# Patient Record
Sex: Male | Born: 1935
Health system: Southern US, Community
[De-identification: ages and names within clinical notes are randomized; demographics above are authoritative.]

## PROBLEM LIST (undated history)

## (undated) DIAGNOSIS — K449 Diaphragmatic hernia without obstruction or gangrene: Secondary | ICD-10-CM

## (undated) DIAGNOSIS — K469 Unspecified abdominal hernia without obstruction or gangrene: Secondary | ICD-10-CM

## (undated) DIAGNOSIS — K5790 Diverticulosis of intestine, part unspecified, without perforation or abscess without bleeding: Secondary | ICD-10-CM

## (undated) DIAGNOSIS — F419 Anxiety disorder, unspecified: Secondary | ICD-10-CM

## (undated) DIAGNOSIS — E785 Hyperlipidemia, unspecified: Secondary | ICD-10-CM

## (undated) DIAGNOSIS — H269 Unspecified cataract: Secondary | ICD-10-CM

## (undated) DIAGNOSIS — T7840XA Allergy, unspecified, initial encounter: Secondary | ICD-10-CM

## (undated) DIAGNOSIS — J329 Chronic sinusitis, unspecified: Secondary | ICD-10-CM

## (undated) HISTORY — DX: Chronic sinusitis, unspecified: J32.9

## (undated) HISTORY — DX: Unspecified abdominal hernia without obstruction or gangrene: K46.9

## (undated) HISTORY — DX: Unspecified cataract: H26.9

## (undated) HISTORY — DX: Hyperlipidemia, unspecified: E78.5

## (undated) HISTORY — PX: TONSILLECTOMY: SUR1361

## (undated) HISTORY — DX: Anxiety disorder, unspecified: F41.9

## (undated) HISTORY — DX: Allergy, unspecified, initial encounter: T78.40XA

---

## 1971-03-14 HISTORY — PX: THYROID CYST EXCISION: SHX2511

## 2002-10-29 ENCOUNTER — Emergency Department (HOSPITAL_COMMUNITY): Admission: EM | Admit: 2002-10-29 | Discharge: 2002-10-29 | Payer: Self-pay | Admitting: Emergency Medicine

## 2002-10-29 ENCOUNTER — Encounter: Payer: Self-pay | Admitting: Emergency Medicine

## 2002-11-03 ENCOUNTER — Emergency Department (HOSPITAL_COMMUNITY): Admission: EM | Admit: 2002-11-03 | Discharge: 2002-11-04 | Payer: Self-pay | Admitting: Emergency Medicine

## 2006-04-19 ENCOUNTER — Ambulatory Visit: Payer: Self-pay | Admitting: Cardiology

## 2006-04-24 ENCOUNTER — Ambulatory Visit: Payer: Self-pay

## 2006-05-29 ENCOUNTER — Ambulatory Visit: Payer: Self-pay | Admitting: Cardiology

## 2011-08-28 ENCOUNTER — Ambulatory Visit: Payer: Self-pay | Admitting: Physical Therapy

## 2012-07-26 ENCOUNTER — Other Ambulatory Visit (INDEPENDENT_AMBULATORY_CARE_PROVIDER_SITE_OTHER): Payer: Medicare Other

## 2012-07-26 DIAGNOSIS — E785 Hyperlipidemia, unspecified: Secondary | ICD-10-CM

## 2012-07-26 DIAGNOSIS — R5383 Other fatigue: Secondary | ICD-10-CM

## 2012-07-26 DIAGNOSIS — E559 Vitamin D deficiency, unspecified: Secondary | ICD-10-CM

## 2012-07-26 DIAGNOSIS — I10 Essential (primary) hypertension: Secondary | ICD-10-CM

## 2012-07-26 LAB — POCT CBC
Lymph, poc: 2.2 (ref 0.6–3.4)
MCH, POC: 32 pg — AB (ref 27–31.2)
MCV: 96.4 fL (ref 80–97)
MPV: 7.4 fL (ref 0–99.8)
POC LYMPH PERCENT: 32.9 %L (ref 10–50)
RBC: 5 M/uL (ref 4.69–6.13)
WBC: 6.6 10*3/uL (ref 4.6–10.2)

## 2012-07-26 LAB — BASIC METABOLIC PANEL WITH GFR
CO2: 30 mEq/L (ref 19–32)
Calcium: 9.8 mg/dL (ref 8.4–10.5)
GFR, Est Non African American: 86 mL/min
Glucose, Bld: 93 mg/dL (ref 70–99)
Potassium: 5.6 mEq/L — ABNORMAL HIGH (ref 3.5–5.3)

## 2012-07-26 LAB — HEPATIC FUNCTION PANEL
ALT: 16 U/L (ref 0–53)
Albumin: 4.3 g/dL (ref 3.5–5.2)
Bilirubin, Direct: 0.1 mg/dL (ref 0.0–0.3)
Indirect Bilirubin: 0.5 mg/dL (ref 0.0–0.9)
Total Protein: 7 g/dL (ref 6.0–8.3)

## 2012-07-26 NOTE — Progress Notes (Unsigned)
Patient came in for labs only, Patient seen Helene Kelp and now has appointment with Dr.Moore

## 2012-07-31 ENCOUNTER — Telehealth: Payer: Self-pay | Admitting: Family Medicine

## 2012-08-01 ENCOUNTER — Other Ambulatory Visit (INDEPENDENT_AMBULATORY_CARE_PROVIDER_SITE_OTHER): Payer: Medicare Other

## 2012-08-01 DIAGNOSIS — R7989 Other specified abnormal findings of blood chemistry: Secondary | ICD-10-CM

## 2012-08-01 LAB — NMR LIPOPROFILE WITH LIPIDS
Cholesterol, Total: 251 mg/dL — ABNORMAL HIGH (ref <200)
HDL Size: 8.6 nm — ABNORMAL LOW (ref >=9.2)
HDL-C: 48 mg/dL (ref >=40)
LDL (calc): 173 mg/dL — ABNORMAL HIGH (ref <100)
LDL Particle Number: 2692 nmol/L — ABNORMAL HIGH (ref <1000)
LDL Size: 21 nm (ref >20.5)
LP-IR Score: 61 — ABNORMAL HIGH (ref <=45)
Large VLDL-P: 1.1 nmol/L (ref <=2.7)
VLDL Size: 46.9 nm — ABNORMAL HIGH (ref <=46.6)

## 2012-08-01 NOTE — Telephone Encounter (Signed)
I see the results of the CBC from 07/26/12 but none of the other results are back.  Can you check on this for me?

## 2012-08-01 NOTE — Telephone Encounter (Signed)
I checked with the lab and the NMR is holding it up. It should be done today or by the latest tomorrow.

## 2012-08-02 LAB — BASIC METABOLIC PANEL WITH GFR
CO2: 28 mEq/L (ref 19–32)
Calcium: 9 mg/dL (ref 8.4–10.5)
Potassium: 3.8 mEq/L (ref 3.5–5.3)
Sodium: 139 mEq/L (ref 135–145)

## 2012-08-02 NOTE — Telephone Encounter (Signed)
All lab results are back.  Left message on patient's home voicemail to return call about results.

## 2012-08-12 ENCOUNTER — Ambulatory Visit (INDEPENDENT_AMBULATORY_CARE_PROVIDER_SITE_OTHER): Payer: Medicare Other | Admitting: Physician Assistant

## 2012-08-12 ENCOUNTER — Encounter: Payer: Self-pay | Admitting: Physician Assistant

## 2012-08-12 VITALS — BP 129/69 | HR 78 | Temp 98.0°F | Ht 65.0 in | Wt 143.0 lb

## 2012-08-12 DIAGNOSIS — J309 Allergic rhinitis, unspecified: Secondary | ICD-10-CM

## 2012-08-12 DIAGNOSIS — F411 Generalized anxiety disorder: Secondary | ICD-10-CM | POA: Insufficient documentation

## 2012-08-12 DIAGNOSIS — J329 Chronic sinusitis, unspecified: Secondary | ICD-10-CM

## 2012-08-12 MED ORDER — CITALOPRAM HYDROBROMIDE 20 MG PO TABS: 20.0000 mg | ORAL_TABLET | Freq: Every day | ORAL | Status: DC

## 2012-08-12 MED ORDER — AMOXICILLIN-POT CLAVULANATE 875-125 MG PO TABS: 1.0000 | ORAL_TABLET | Freq: Two times a day (BID) | ORAL | Status: DC

## 2012-08-12 NOTE — Progress Notes (Signed)
Subjective:     Patient ID: Craig Baird, male   DOB: November 14, 1933, 79 y.o.   MRN: [id number]  Sinusitis This is a recurrent problem. The current episode started in the past 7 days. The problem has been gradually worsening since onset. There has been no fever. His pain is at a severity of 4/10. The pain is mild. Associated symptoms include congestion, headaches, a hoarse voice, sinus pressure and sneezing. Past treatments include acetaminophen. The treatment provided no relief.     Review of Systems  HENT: Positive for congestion, hoarse voice, sneezing and sinus pressure.   Neurological: Positive for headaches.  All other systems reviewed and are negative.       Objective:   Physical Exam  Nursing note and vitals reviewed. Constitutional: He appears well-developed and well-nourished.  HENT:  Right Ear: External ear normal.  Left Ear: External ear normal.  Nose: Nose normal.  Mouth/Throat: Oropharynx is clear and moist.  Thick PND + sinus TTP  Neck: Neck supple. No JVD present.  Cardiovascular: Normal rate, regular rhythm and normal heart sounds.   Pulmonary/Chest: Effort normal and breath sounds normal. He has no wheezes.  Lymphadenopathy:    He has no cervical adenopathy.       Assessment:     1. Allergic rhinitis   2. Anxiety state, unspecified        Plan:     Nasonex sample given OTC antihistamines Fluids Rest Augmentin rx Rf of Celexa done today

## 2012-08-12 NOTE — Patient Instructions (Signed)
Sinusitis Sinusitis is redness, soreness, and swelling (inflammation) of the paranasal sinuses. Paranasal sinuses are air pockets within the bones of your face (beneath the eyes, the middle of the forehead, or above the eyes). In healthy paranasal sinuses, mucus is able to drain out, and air is able to circulate through them by way of your nose. However, when your paranasal sinuses are inflamed, mucus and air can become trapped. This can allow bacteria and other germs to grow and cause infection. Sinusitis can develop quickly and last only a short time (acute) or continue over a long period (chronic). Sinusitis that lasts for more than 12 weeks is considered chronic.  CAUSES  Causes of sinusitis include:  Allergies.  Structural abnormalities, such as displacement of the cartilage that separates your nostrils (deviated septum), which can decrease the air flow through your nose and sinuses and affect sinus drainage.  Functional abnormalities, such as when the small hairs (cilia) that line your sinuses and help remove mucus do not work properly or are not present. SYMPTOMS  Symptoms of acute and chronic sinusitis are the same. The primary symptoms are pain and pressure around the affected sinuses. Other symptoms include:  Upper toothache.  Earache.  Headache.  Bad breath.  Decreased sense of smell and taste.  A cough, which worsens when you are lying flat.  Fatigue.  Fever.  Thick drainage from your nose, which often is green and may contain pus (purulent).  Swelling and warmth over the affected sinuses. DIAGNOSIS  Your caregiver will perform a physical exam. During the exam, your caregiver may:  Look in your nose for signs of abnormal growths in your nostrils (nasal polyps).  Tap over the affected sinus to check for signs of infection.  View the inside of your sinuses (endoscopy) with a special imaging device with a light attached (endoscope), which is inserted into your  sinuses. If your caregiver suspects that you have chronic sinusitis, one or more of the following tests may be recommended:  Allergy tests.  Nasal culture A sample of mucus is taken from your nose and sent to a lab and screened for bacteria.  Nasal cytology A sample of mucus is taken from your nose and examined by your caregiver to determine if your sinusitis is related to an allergy. TREATMENT  Most cases of acute sinusitis are related to a viral infection and will resolve on their own within 10 days. Sometimes medicines are prescribed to help relieve symptoms (pain medicine, decongestants, nasal steroid sprays, or saline sprays).  However, for sinusitis related to a bacterial infection, your caregiver will prescribe antibiotic medicines. These are medicines that will help kill the bacteria causing the infection.  Rarely, sinusitis is caused by a fungal infection. In theses cases, your caregiver will prescribe antifungal medicine. For some cases of chronic sinusitis, surgery is needed. Generally, these are cases in which sinusitis recurs more than 3 times per year, despite other treatments. HOME CARE INSTRUCTIONS   Drink plenty of water. Water helps thin the mucus so your sinuses can drain more easily.  Use a humidifier.  Inhale steam 3 to 4 times a day (for example, sit in the bathroom with the shower running).  Apply a warm, moist washcloth to your face 3 to 4 times a day, or as directed by your caregiver.  Use saline nasal sprays to help moisten and clean your sinuses.  Take over-the-counter or prescription medicines for pain, discomfort, or fever only as directed by your caregiver. SEEK IMMEDIATE MEDICAL   CARE IF:  You have increasing pain or severe headaches.  You have nausea, vomiting, or drowsiness.  You have swelling around your face.  You have vision problems.  You have a stiff neck.  You have difficulty breathing. MAKE SURE YOU:   Understand these  instructions.  Will watch your condition.  Will get help right away if you are not doing well or get worse. Document Released: 02/27/2005 Document Revised: 05/22/2011 Document Reviewed: 03/14/2011 [facility] Patient Information 2014 [place], [place]. Allergic Rhinitis Allergic rhinitis is when the mucous membranes in the nose respond to allergens. Allergens are particles in the air that cause your body to have an allergic reaction. This causes you to release allergic antibodies. Through a chain of events, these eventually cause you to release histamine into the blood stream (hence the use of antihistamines). Although meant to be protective to the body, it is this release that causes your discomfort, such as frequent sneezing, congestion and an itchy runny nose.  CAUSES  The pollen allergens may come from grasses, trees, and weeds. This is seasonal allergic rhinitis, or "hay fever." Other allergens cause year-round allergic rhinitis (perennial allergic rhinitis) such as house dust mite allergen, pet dander and mold spores.  SYMPTOMS   Nasal stuffiness (congestion).  Runny, itchy nose with sneezing and tearing of the eyes.  There is often an itching of the mouth, eyes and ears. It cannot be cured, but it can be controlled with medications. DIAGNOSIS  If you are unable to determine the offending allergen, skin or blood testing may find it. TREATMENT   Avoid the allergen.  Medications and allergy shots (immunotherapy) can help.  Hay fever may often be treated with antihistamines in pill or nasal spray forms. Antihistamines block the effects of histamine. There are over-the-counter medicines that may help with nasal congestion and swelling around the eyes. Check with your caregiver before taking or giving this medicine. If the treatment above does not work, there are many new medications your caregiver can prescribe. Stronger medications may be used if initial measures are ineffective.  Desensitizing injections can be used if medications and avoidance fails. Desensitization is when a patient is given ongoing shots until the body becomes less sensitive to the allergen. Make sure you follow up with your caregiver if problems continue. SEEK MEDICAL CARE IF:   You develop fever (more than 100.5 F (38.1 C).  You develop a cough that does not stop easily (persistent).  You have shortness of breath.  You start wheezing.  Symptoms interfere with normal daily activities. Document Released: 11/22/2000 Document Revised: 05/22/2011 Document Reviewed: 06/03/2008 [facility] Patient Information 2014 [place], [place].

## 2012-08-16 ENCOUNTER — Ambulatory Visit: Payer: Self-pay | Admitting: Family Medicine

## 2012-09-30 ENCOUNTER — Ambulatory Visit (INDEPENDENT_AMBULATORY_CARE_PROVIDER_SITE_OTHER): Payer: Medicare Other | Admitting: Family Medicine

## 2012-09-30 ENCOUNTER — Encounter: Payer: Self-pay | Admitting: Family Medicine

## 2012-09-30 VITALS — BP 132/67 | HR 55 | Temp 97.8°F | Ht 65.0 in | Wt 142.8 lb

## 2012-09-30 DIAGNOSIS — F329 Major depressive disorder, single episode, unspecified: Secondary | ICD-10-CM

## 2012-09-30 DIAGNOSIS — J329 Chronic sinusitis, unspecified: Secondary | ICD-10-CM

## 2012-09-30 NOTE — Patient Instructions (Signed)

## 2012-09-30 NOTE — Progress Notes (Signed)
  Subjective:    Patient ID: Craig Baird, male    DOB: 06/25/1933, 79 y.o.   MRN: [id number]  HPI  This 80 y.o. male presents for evaluation of sinus congestion and uri sx's.  Review of Systems    No chest pain, SOB, HA, dizziness, vision change, N/V, diarrhea, constipation, dysuria, urinary urgency or frequency, myalgias, arthralgias or rash.  Objective:   Physical Exam Vital signs noted  Well developed well nourished male.  HEENT - Head atraumatic Normocephalic                Eyes - PERRLA, Conjuctiva - clear Sclera- Clear EOMI                Ears - EAC's Wnl TM's Wnl Gross Hearing WNL                Nose - Nares patent                 Throat - oropharanx wnl Respiratory - Lungs CTA bilateral Cardiac - RRR S1 and S2 without murmur GI - Abdomen soft Nontender and bowel sounds active x 4 Extremities - No edema. Neuro - Grossly intact.       Assessment & Plan:  Depression Citalopram 20mg  one po qd #30w/11rf  Sinusitis - Augmentin 875mg  one po bid x 10 days #20, push po fluids, rest, and take tylenol and motrin otc prn.

## 2012-10-02 ENCOUNTER — Ambulatory Visit: Payer: Medicare Other | Admitting: Family Medicine

## 2013-01-31 ENCOUNTER — Ambulatory Visit (INDEPENDENT_AMBULATORY_CARE_PROVIDER_SITE_OTHER): Payer: Medicare Other | Admitting: Family Medicine

## 2013-01-31 ENCOUNTER — Ambulatory Visit (INDEPENDENT_AMBULATORY_CARE_PROVIDER_SITE_OTHER): Payer: Medicare Other

## 2013-01-31 VITALS — BP 127/68 | HR 77 | Temp 98.2°F | Ht 65.0 in | Wt 141.8 lb

## 2013-01-31 DIAGNOSIS — M25569 Pain in unspecified knee: Secondary | ICD-10-CM

## 2013-01-31 DIAGNOSIS — M25561 Pain in right knee: Secondary | ICD-10-CM

## 2013-01-31 NOTE — Progress Notes (Signed)
  Subjective:    Patient ID: Herald Eskridge, male    DOB: November 22, 1933, 79 y.o.   MRN: [id number]  HPI  This 79 y.o. male presents for evaluation of bilateral knee pain.  Review of Systems    No chest pain, SOB, HA, dizziness, vision change, N/V, diarrhea, constipation, dysuria, urinary urgency or frequency, myalgias, arthralgias or rash.  Objective:   Physical Exam Vital signs noted  Well developed well nourished male.  HEENT - Head atraumatic Normocephalic                Eyes - PERRLA, Conjuctiva - clear Sclera- Clear EOMI                Ears - EAC's Wnl TM's Wnl Gross Hearing WNL                Nose - Nares patent                 Throat - oropharanx wnl Respiratory - Lungs CTA bilateral Cardiac - RRR S1 and S2 without murmur GI - Abdomen soft Nontender and bowel sounds active x 4 Extremities - No edema. Neuro - Grossly intact.   Procedure - Left and right lateral knee prepped with ETOH and one cc kenalog 2 cc of lido and 2cc of marcaine injected laterally under the patella after anesthetized with topical anesthetic and patient tolerates well and expresses immediate relief.  Xray of right knee - No acute fracture    Assessment & Plan:  Knee pain, acute, right - Plan: DG Knee 1-2 Views Right Bilateral knees injected.

## 2013-04-10 ENCOUNTER — Encounter: Payer: Self-pay | Admitting: Family Medicine

## 2013-04-10 ENCOUNTER — Ambulatory Visit (INDEPENDENT_AMBULATORY_CARE_PROVIDER_SITE_OTHER): Payer: Medicare Other | Admitting: Family Medicine

## 2013-04-10 VITALS — BP 141/75 | HR 84 | Temp 97.6°F | Ht 65.0 in | Wt 143.2 lb

## 2013-04-10 DIAGNOSIS — R5381 Other malaise: Secondary | ICD-10-CM

## 2013-04-10 DIAGNOSIS — J329 Chronic sinusitis, unspecified: Secondary | ICD-10-CM

## 2013-04-10 DIAGNOSIS — R5383 Other fatigue: Secondary | ICD-10-CM

## 2013-04-10 LAB — POCT CBC
Granulocyte percent: 63.5 %G (ref 37–80)
HCT, POC: 47.5 % (ref 43.5–53.7)
Hemoglobin: 14.5 g/dL (ref 14.1–18.1)
Lymph, poc: 1.7 (ref 0.6–3.4)
MCH, POC: 29.5 pg (ref 27–31.2)
MCHC: 30.5 g/dL — AB (ref 31.8–35.4)
MCV: 96.7 fL (ref 80–97)
MPV: 7 fL (ref 0–99.8)
POC Granulocyte: 4.2 (ref 2–6.9)
POC LYMPH PERCENT: 26.5 %L (ref 10–50)
Platelet Count, POC: 261 10*3/uL (ref 142–424)
RBC: 4.9 M/uL (ref 4.69–6.13)
RDW, POC: 13.1 %
WBC: 6.6 10*3/uL (ref 4.6–10.2)

## 2013-04-10 MED ORDER — AMOXICILLIN-POT CLAVULANATE 875-125 MG PO TABS: 1.0000 | ORAL_TABLET | Freq: Two times a day (BID) | ORAL | Status: DC

## 2013-04-10 NOTE — Progress Notes (Signed)
   Subjective:    Patient ID: Kourtney Montefusco, male    DOB: 1934/03/12, 80 y.o.   MRN: [id number]  HPI This 80 y.o. male presents for evaluation of sinus congestion and uri sx's.   Review of Systems No chest pain, SOB, HA, dizziness, vision change, N/V, diarrhea, constipation, dysuria, urinary urgency or frequency, myalgias, arthralgias or rash.     Objective:   Physical Exam Vital signs noted  Well developed well nourished male.  HEENT - Head atraumatic Normocephalic                Eyes - PERRLA, Conjuctiva - clear Sclera- Clear EOMI                Ears - EAC's Wnl TM's Wnl Gross Hearing WNL                Throat - oropharanx wnl Respiratory - Lungs CTA bilateral Cardiac - RRR S1 and S2 without murmur GI - Abdomen soft Nontender and bowel sounds active x 4 Extremities - No edema. Neuro - Grossly intact.       Assessment & Plan:  Sinusitis - Plan: amoxicillin-clavulanate (AUGMENTIN) 875-125 MG per tablet, POCT CBC, CMP14+EGFR  Other malaise and fatigue - Plan: POCT CBC, CMP14+EGFR, Thyroid Panel With TSH  Push po fluids, rest, tylenol and motrin otc prn as directed for fever, arthralgias, and myalgias.  Follow up prn if sx's continue or persist.  Lysbeth Penner FNP

## 2013-04-10 NOTE — Patient Instructions (Signed)

## 2013-04-11 LAB — CMP14+EGFR
ALT: 16 IU/L (ref 0–44)
AST: 24 IU/L (ref 0–40)
Albumin/Globulin Ratio: 2.1 (ref 1.1–2.5)
Albumin: 4.4 g/dL (ref 3.5–4.8)
Alkaline Phosphatase: 93 IU/L (ref 39–117)
BUN/Creatinine Ratio: 12 (ref 10–22)
BUN: 9 mg/dL (ref 8–27)
CO2: 29 mmol/L (ref 18–29)
Calcium: 9.4 mg/dL (ref 8.6–10.2)
Chloride: 101 mmol/L (ref 97–108)
Creatinine, Ser: 0.78 mg/dL (ref 0.76–1.27)
GFR calc Af Amer: 101 mL/min/1.73
GFR calc non Af Amer: 87 mL/min/1.73
Globulin, Total: 2.1 g/dL (ref 1.5–4.5)
Glucose: 95 mg/dL (ref 65–99)
Potassium: 4.7 mmol/L (ref 3.5–5.2)
Sodium: 145 mmol/L — ABNORMAL HIGH (ref 134–144)
Total Bilirubin: <0.2 mg/dL (ref 0.0–1.2)
Total Protein: 6.5 g/dL (ref 6.0–8.5)

## 2013-04-11 LAB — THYROID PANEL WITH TSH
Free Thyroxine Index: 1.9 (ref 1.2–4.9)
T3 Uptake Ratio: 27 % (ref 24–39)
T4, Total: 7.1 ug/dL (ref 4.5–12.0)
TSH: 1.79 u[IU]/mL (ref 0.450–4.500)

## 2013-07-04 ENCOUNTER — Encounter: Payer: Self-pay | Admitting: Nurse Practitioner

## 2013-07-04 ENCOUNTER — Ambulatory Visit (INDEPENDENT_AMBULATORY_CARE_PROVIDER_SITE_OTHER): Payer: Medicare Other | Admitting: Nurse Practitioner

## 2013-07-04 VITALS — BP 125/65 | HR 69 | Temp 98.3°F | Ht 65.0 in | Wt 144.0 lb

## 2013-07-04 DIAGNOSIS — J309 Allergic rhinitis, unspecified: Secondary | ICD-10-CM

## 2013-07-04 MED ORDER — FLUTICASONE PROPIONATE 50 MCG/ACT NA SUSP: 2.0000 | Freq: Every day | NASAL | Status: DC

## 2013-07-04 MED ORDER — CETIRIZINE HCL 10 MG PO CAPS: 1.0000 | ORAL_CAPSULE | Freq: Every day | ORAL | Status: DC

## 2013-07-04 NOTE — Patient Instructions (Signed)
Allergic Rhinitis Allergic rhinitis is when the mucous membranes in the nose respond to allergens. Allergens are particles in the air that cause your body to have an allergic reaction. This causes you to release allergic antibodies. Through a chain of events, these eventually cause you to release histamine into the blood stream. Although meant to protect the body, it is this release of histamine that causes your discomfort, such as frequent sneezing, congestion, and an itchy, runny nose.  CAUSES  Seasonal allergic rhinitis (hay fever) is caused by pollen allergens that may come from grasses, trees, and weeds. Year-round allergic rhinitis (perennial allergic rhinitis) is caused by allergens such as house dust mites, pet dander, and mold spores.  SYMPTOMS   Nasal stuffiness (congestion).  Itchy, runny nose with sneezing and tearing of the eyes. DIAGNOSIS  Your health care provider can help you determine the allergen or allergens that trigger your symptoms. If you and your health care provider are unable to determine the allergen, skin or blood testing may be used. TREATMENT  Allergic Rhinitis does not have a cure, but it can be controlled by:  Medicines and allergy shots (immunotherapy).  Avoiding the allergen. Hay fever may often be treated with antihistamines in pill or nasal spray forms. Antihistamines block the effects of histamine. There are over-the-counter medicines that may help with nasal congestion and swelling around the eyes. Check with your health care provider before taking or giving this medicine.  If avoiding the allergen or the medicine prescribed do not work, there are many new medicines your health care provider can prescribe. Stronger medicine may be used if initial measures are ineffective. Desensitizing injections can be used if medicine and avoidance does not work. Desensitization is when a patient is given ongoing shots until the body becomes less sensitive to the allergen.  Make sure you follow up with your health care provider if problems continue. HOME CARE INSTRUCTIONS It is not possible to completely avoid allergens, but you can reduce your symptoms by taking steps to limit your exposure to them. It helps to know exactly what you are allergic to so that you can avoid your specific triggers. SEEK MEDICAL CARE IF:   You have a fever.  You develop a cough that does not stop easily (persistent).  You have shortness of breath.  You start wheezing.  Symptoms interfere with normal daily activities. Document Released: 11/22/2000 Document Revised: 12/18/2012 Document Reviewed: 11/04/2012 [employer] Patient Information 2014 [employer].  

## 2013-07-04 NOTE — Progress Notes (Signed)
   Subjective:    Patient ID: Craig Baird, male    DOB: 26-Aug-1933, 80 y.o.   MRN: [id number]  HPI Patient in c/o congestion and sneezing- no fever- started several days ago    Review of Systems  Constitutional: Negative for fever, chills and appetite change.  HENT: Positive for congestion and sneezing.   Respiratory: Negative for cough.   Cardiovascular: Negative.   Neurological: Positive for headaches.       Objective:   Physical Exam  Constitutional: He is oriented to person, place, and time. He appears well-developed and well-nourished.  HENT:  Right Ear: Hearing, tympanic membrane, external ear and ear canal normal.  Left Ear: Hearing, tympanic membrane, external ear and ear canal normal.  Nose: Mucosal edema and rhinorrhea present. Right sinus exhibits no maxillary sinus tenderness and no frontal sinus tenderness. Left sinus exhibits no maxillary sinus tenderness and no frontal sinus tenderness.  Mouth/Throat: Uvula is midline and oropharynx is clear and moist.  Eyes: Conjunctivae are normal. Pupils are equal, round, and reactive to light.  Neck: Normal range of motion. Neck supple.  Cardiovascular: Normal rate, regular rhythm and normal heart sounds.   Pulmonary/Chest: Effort normal and breath sounds normal.  Lymphadenopathy:    He has no cervical adenopathy.  Neurological: He is alert and oriented to person, place, and time.  Skin: Skin is warm.  Psychiatric: He has a normal mood and affect. His behavior is normal. Judgment and thought content normal.   BP 125/65  Pulse 69  Temp(Src) 98.3 F (36.8 C) (Oral)  Ht 5\' 5"  (1.651 m)  Wt 144 lb (65.318 kg)  BMI 23.96 kg/m2         Assessment & Plan:   1. Allergic rhinitis    Meds ordered this encounter  Medications  . fluticasone (FLONASE) 50 MCG/ACT nasal spray    Sig: Place 2 sprays into both nostrils daily.    Dispense:  16 g    Refill:  6    Order Specific Question:  Supervising Provider    Answer:   Chipper Herb [[id number]]  . Cetirizine HCl 10 MG CAPS    Sig: Take 1 capsule (10 mg total) by mouth daily.    Dispense:  30 capsule    Refill:  5    Order Specific Question:  Supervising Provider    Answer:  Joycelyn Man   Avoid allergens Force fluids  RTO prn  Mary-Margaret Hassell Done, FNP

## 2013-09-05 ENCOUNTER — Ambulatory Visit (INDEPENDENT_AMBULATORY_CARE_PROVIDER_SITE_OTHER): Payer: Medicare Other | Admitting: Family Medicine

## 2013-09-05 VITALS — BP 128/79 | HR 77 | Temp 97.4°F | Ht 65.0 in | Wt 140.6 lb

## 2013-09-05 DIAGNOSIS — R197 Diarrhea, unspecified: Secondary | ICD-10-CM

## 2013-09-05 MED ORDER — CIPROFLOXACIN HCL 250 MG PO TABS: 250.0000 mg | ORAL_TABLET | Freq: Two times a day (BID) | ORAL | Status: DC

## 2013-09-05 NOTE — Progress Notes (Signed)
   Subjective:    Patient ID: Craig Baird, male    DOB: 04-Apr-1933, 80 y.o.   MRN: [id number]  HPI This 80 y.o. male presents for evaluation of diarrhea for 5 days now.  He states he has been having diarrhea bm's two times a day now.  He denies fever.   Review of Systems C/o diarrhea No chest pain, SOB, HA, dizziness, vision change, N/V, diarrhea, constipation, dysuria, urinary urgency or frequency, myalgias, arthralgias or rash.     Objective:   Physical Exam  Vital signs noted  Well developed well nourished male.  HEENT - Head atraumatic Normocephalic                Eyes - PERRLA, Conjuctiva - clear Sclera- Clear EOMI                Ears - EAC's Wnl TM's Wnl Gross Hearing WNL                Nose - Nares patent                 Throat - oropharanx wnl Respiratory - Lungs CTA bilateral Cardiac - RRR S1 and S2 without murmur GI - Abdomen soft Nontender and bowel sounds active x 4 Extremities - No edema. Neuro - Grossly intact.      Assessment & Plan:  Diarrhea - Plan: ciprofloxacin (CIPRO) 250 MG tablet Po bid x 5 days #10 Lysbeth Penner FNP

## 2013-10-22 ENCOUNTER — Telehealth: Payer: Self-pay | Admitting: Family Medicine

## 2013-10-22 NOTE — Telephone Encounter (Signed)
appt scheduled for 8/31 with bill

## 2013-11-10 ENCOUNTER — Ambulatory Visit (INDEPENDENT_AMBULATORY_CARE_PROVIDER_SITE_OTHER): Payer: Medicare Other | Admitting: Family Medicine

## 2013-11-10 VITALS — BP 134/70 | HR 66 | Temp 97.1°F | Ht 65.0 in | Wt 141.6 lb

## 2013-11-10 DIAGNOSIS — F411 Generalized anxiety disorder: Secondary | ICD-10-CM

## 2013-11-10 DIAGNOSIS — M25569 Pain in unspecified knee: Secondary | ICD-10-CM

## 2013-11-10 DIAGNOSIS — Z139 Encounter for screening, unspecified: Secondary | ICD-10-CM

## 2013-11-10 DIAGNOSIS — R5381 Other malaise: Secondary | ICD-10-CM

## 2013-11-10 DIAGNOSIS — R5383 Other fatigue: Secondary | ICD-10-CM

## 2013-11-10 DIAGNOSIS — E785 Hyperlipidemia, unspecified: Secondary | ICD-10-CM

## 2013-11-10 LAB — POCT CBC
Granulocyte percent: 57.2 %G (ref 37–80)
HCT, POC: 44.7 % (ref 43.5–53.7)
Hemoglobin: 14.8 g/dL (ref 14.1–18.1)
Lymph, poc: 1.9 (ref 0.6–3.4)
MCH, POC: 31.9 pg — AB (ref 27–31.2)
MCHC: 33.2 g/dL (ref 31.8–35.4)
MCV: 96.1 fL (ref 80–97)
MPV: 6.6 fL (ref 0–99.8)
POC Granulocyte: 3.4 (ref 2–6.9)
POC LYMPH PERCENT: 32.5 %L (ref 10–50)
Platelet Count, POC: 246 10*3/uL (ref 142–424)
RBC: 4.7 M/uL (ref 4.69–6.13)
RDW, POC: 13 %
WBC: 5.9 10*3/uL (ref 4.6–10.2)

## 2013-11-10 MED ORDER — CITALOPRAM HYDROBROMIDE 20 MG PO TABS: 20.0000 mg | ORAL_TABLET | Freq: Every day | ORAL | Status: DC

## 2013-11-10 NOTE — Progress Notes (Signed)
   Subjective:    Patient ID: Craig Baird, male    DOB: 26-Feb-1934, 80 y.o.   MRN: [id number]  HPI This 80 y.o. male presents for evaluation of wanting a check up and left knee injection.  He has hx  Of arthritis and knee arthralgias and states he feels a lot better when getting knee injections on occasion.  He has hx of anxiety and needs refill on citalopram.  He was wanting immunizations. He is utd and can get a prevnar with his next flu shot.  He c/o left knee arthralgias.   Review of Systems No chest pain, SOB, HA, dizziness, vision change, N/V, diarrhea, constipation, dysuria, urinary urgency or frequency, myalgias, arthralgias or rash.     Objective:   Physical Exam  Vital signs noted  Well developed well nourished male.  HEENT - Head atraumatic Normocephalic                Eyes - PERRLA, Conjuctiva - clear Sclera- Clear EOMI                Ears - EAC's Wnl TM's Wnl Gross Hearing WNL                Nose - Nares patent                 Throat - oropharanx wnl Respiratory - Lungs CTA bilateral Cardiac - RRR S1 and S2 without murmur GI - Abdomen soft Nontender and bowel sounds active x 4 Extremities - No edema. Neuro - Grossly intact.  Procedure - Left knee prepped with etoh and then injected with one cc of depomedrol 73m and 2 cc's of 1% lidocaine.  Patient tolerated well and expressed relief of pain.    Assessment & Plan:  Hyperlipemia - Plan: Lipid panel, CMP14+EGFR  Other fatigue - Plan: POCT CBC  Screening - Plan: PSA, total and free, CANCELED: Pneumococcal conjugate vaccine 13-valent, CANCELED: Tdap vaccine greater than or equal to 7yo IM He is utd with immunizations and can get prevnar with flu shot.  Anxiety state, unspecified - Plan: TSH, citalopram (CELEXA) 20 MG tablet  Arthritis - Left knee injection with lidocaine and 824mdepomedrol.  WiLysbeth PennerNP

## 2013-11-11 ENCOUNTER — Other Ambulatory Visit: Payer: Self-pay | Admitting: Family Medicine

## 2013-11-11 LAB — CMP14+EGFR
ALT: 16 IU/L (ref 0–44)
AST: 21 IU/L (ref 0–40)
Albumin/Globulin Ratio: 1.9 (ref 1.1–2.5)
Albumin: 4.2 g/dL (ref 3.5–4.8)
Alkaline Phosphatase: 87 IU/L (ref 39–117)
BUN/Creatinine Ratio: 13 (ref 10–22)
BUN: 11 mg/dL (ref 8–27)
CO2: 28 mmol/L (ref 18–29)
Calcium: 9.1 mg/dL (ref 8.6–10.2)
Chloride: 101 mmol/L (ref 97–108)
Creatinine, Ser: 0.88 mg/dL (ref 0.76–1.27)
GFR calc Af Amer: 96 mL/min/{1.73_m2} (ref >59)
GFR calc non Af Amer: 83 mL/min/{1.73_m2} (ref >59)
Globulin, Total: 2.2 g/dL (ref 1.5–4.5)
Glucose: 85 mg/dL (ref 65–99)
Potassium: 4.8 mmol/L (ref 3.5–5.2)
Sodium: 142 mmol/L (ref 134–144)
Total Bilirubin: 0.3 mg/dL (ref 0.0–1.2)
Total Protein: 6.4 g/dL (ref 6.0–8.5)

## 2013-11-11 LAB — LIPID PANEL
Chol/HDL Ratio: 4.5 ratio units (ref 0.0–5.0)
Cholesterol, Total: 245 mg/dL — ABNORMAL HIGH (ref 100–199)
HDL: 54 mg/dL (ref >39)
LDL Calculated: 169 mg/dL — ABNORMAL HIGH (ref 0–99)
Triglycerides: 112 mg/dL (ref 0–149)
VLDL Cholesterol Cal: 22 mg/dL (ref 5–40)

## 2013-11-11 LAB — PSA, TOTAL AND FREE
PSA, Free Pct: 14.3 %
PSA, Free: 0.33 ng/mL
PSA: 2.3 ng/mL (ref 0.0–4.0)

## 2013-11-11 LAB — TSH: TSH: 2.37 u[IU]/mL (ref 0.450–4.500)

## 2013-11-11 MED ORDER — PRAVASTATIN SODIUM 40 MG PO TABS: 40.0000 mg | ORAL_TABLET | Freq: Every day | ORAL | Status: DC

## 2013-12-08 ENCOUNTER — Ambulatory Visit: Payer: Medicare Other

## 2014-04-01 ENCOUNTER — Ambulatory Visit (INDEPENDENT_AMBULATORY_CARE_PROVIDER_SITE_OTHER): Payer: Medicare Other | Admitting: Family Medicine

## 2014-04-01 ENCOUNTER — Encounter: Payer: Self-pay | Admitting: Family Medicine

## 2014-04-01 VITALS — BP 156/73 | HR 66 | Temp 97.4°F | Ht 65.0 in | Wt 144.0 lb

## 2014-04-01 DIAGNOSIS — F329 Major depressive disorder, single episode, unspecified: Secondary | ICD-10-CM | POA: Diagnosis not present

## 2014-04-01 DIAGNOSIS — F32A Depression, unspecified: Secondary | ICD-10-CM

## 2014-04-01 DIAGNOSIS — E785 Hyperlipidemia, unspecified: Secondary | ICD-10-CM | POA: Diagnosis not present

## 2014-04-01 MED ORDER — DULOXETINE HCL 30 MG PO CPEP: 30.0000 mg | ORAL_CAPSULE | Freq: Every day | ORAL | Status: DC

## 2014-04-01 MED ORDER — TRAZODONE HCL 150 MG PO TABS: 75.0000 mg | ORAL_TABLET | Freq: Every evening | ORAL | Status: DC | PRN

## 2014-04-01 MED ORDER — ASPIRIN EC 81 MG PO TBEC: 81.0000 mg | DELAYED_RELEASE_TABLET | Freq: Every day | ORAL | Status: DC

## 2014-04-01 NOTE — Patient Instructions (Addendum)
Per our lengthy discussion pravastatin has minimal risk related to myalgia and liver function. Other side effects are routine. We discussed risk-benefit ratio and the risk of heart attack and stroke as compared to the risk of liver damage from taking the medication. We discussed that every 6 months he could have a liver function tests to monitor for potential problems from his medication

## 2014-04-01 NOTE — Progress Notes (Signed)
   Subjective:    Patient ID: Craig Baird, male    DOB: 1933-10-29, 81 y.o.   MRN: [id number]  HPI Patient is here today for chronic medical problems which include hyperlipidemia, allergies and generalized anxiety disorder. Patient states that anxiety is under poor control. He says that the citalopram makes him itch all over. Additionally he has anxiety spells where he just has to get up and walk around because he is so agitated and irritable. There is some resolution after several minutes of walking  Patient states he is currently not taking the pravastatin because of concern about side effects. He took it for just a day or so and then read the package insert and decided not to take it until he discussed it with me today.   Review of Systems  Constitutional: Negative for fever, chills, diaphoresis and unexpected weight change.  HENT: Negative for congestion, hearing loss, rhinorrhea, sore throat and trouble swallowing.   Gastrointestinal: Negative for nausea, vomiting, abdominal pain, diarrhea, constipation and abdominal distention.  Endocrine: Negative for cold intolerance and heat intolerance.  Genitourinary: Negative for dysuria, hematuria and flank pain.  Musculoskeletal: Negative for joint swelling and arthralgias.  Skin: Negative for rash.  Neurological: Negative for dizziness and headaches.  Psychiatric/Behavioral: Negative for dysphoric mood, decreased concentration and agitation. The patient is not nervous/anxious.        Objective:   Physical Exam  Constitutional: He is oriented to person, place, and time. He appears well-developed and well-nourished. No distress.  HENT:  Head: Normocephalic and atraumatic.  Right Ear: External ear normal.  Left Ear: External ear normal.  Nose: Nose normal.  Mouth/Throat: Oropharynx is clear and moist.  Eyes: Conjunctivae and EOM are normal. Pupils are equal, round, and reactive to light.  Neck: Normal range of motion. Neck supple. No  thyromegaly present.  Cardiovascular: Normal rate, regular rhythm and normal heart sounds.   No murmur heard. Pulmonary/Chest: Effort normal and breath sounds normal. No respiratory distress. He has no wheezes. He has no rales.  Abdominal: Soft. Bowel sounds are normal. He exhibits no distension. There is no tenderness.  Lymphadenopathy:    He has no cervical adenopathy.  Neurological: He is alert and oriented to person, place, and time. He has normal reflexes.  Skin: Skin is warm and dry.  Psychiatric: He has a normal mood and affect. His behavior is normal. Judgment and thought content normal.   BP 156/73 mmHg  Pulse 66  Temp(Src) 97.4 F (36.3 C) (Oral)  Ht 5\' 5"  (1.651 m)  Wt 144 lb (65.318 kg)  BMI 23.96 kg/m2        Assessment & Plan:  Hyperlipemia - Plan: aspirin EC 81 MG tablet, Lipid panel, Hepatic function panel  Depression - Plan: DULoxetine (CYMBALTA) 30 MG capsule Discontinue the escitalopram. Monitor for itch.

## 2014-04-02 LAB — LIPID PANEL
Chol/HDL Ratio: 3.8 ratio units (ref 0.0–5.0)
Cholesterol, Total: 233 mg/dL — ABNORMAL HIGH (ref 100–199)
HDL: 62 mg/dL (ref >39)
LDL CALC: 151 mg/dL — AB (ref 0–99)
Triglycerides: 100 mg/dL (ref 0–149)
VLDL CHOLESTEROL CAL: 20 mg/dL (ref 5–40)

## 2014-04-02 LAB — HEPATIC FUNCTION PANEL
ALT: 15 IU/L (ref 0–44)
AST: 19 IU/L (ref 0–40)
Albumin: 4.2 g/dL (ref 3.5–4.8)
Alkaline Phosphatase: 94 IU/L (ref 39–117)
BILIRUBIN DIRECT: 0.1 mg/dL (ref 0.00–0.40)
BILIRUBIN TOTAL: 0.4 mg/dL (ref 0.0–1.2)
Total Protein: 6.7 g/dL (ref 6.0–8.5)

## 2014-05-12 ENCOUNTER — Telehealth: Payer: Self-pay | Admitting: Family Medicine

## 2014-05-13 NOTE — Telephone Encounter (Signed)
Called and spoke with wife regarding RX for Citalopram Informed of change to Cymbalta and informed of refills Verbalizes understanding

## 2014-05-13 NOTE — Telephone Encounter (Signed)
Looks like this was changed to cymbalta, just checking?

## 2014-05-20 ENCOUNTER — Ambulatory Visit (INDEPENDENT_AMBULATORY_CARE_PROVIDER_SITE_OTHER): Payer: Medicare Other | Admitting: Family

## 2014-05-20 ENCOUNTER — Encounter: Payer: Self-pay | Admitting: Family

## 2014-05-20 VITALS — BP 138/74 | HR 71 | Temp 97.1°F | Ht 65.0 in | Wt 143.6 lb

## 2014-05-20 DIAGNOSIS — F32A Depression, unspecified: Secondary | ICD-10-CM

## 2014-05-20 DIAGNOSIS — F329 Major depressive disorder, single episode, unspecified: Secondary | ICD-10-CM

## 2014-05-20 DIAGNOSIS — M1990 Unspecified osteoarthritis, unspecified site: Secondary | ICD-10-CM | POA: Diagnosis not present

## 2014-05-20 DIAGNOSIS — F411 Generalized anxiety disorder: Secondary | ICD-10-CM | POA: Diagnosis not present

## 2014-05-20 MED ORDER — CITALOPRAM HYDROBROMIDE 20 MG PO TABS: 20.0000 mg | ORAL_TABLET | Freq: Every day | ORAL | Status: DC

## 2014-05-20 MED ORDER — METHYLPREDNISOLONE ACETATE 80 MG/ML IJ SUSP
80.0000 mg | Freq: Once | INTRAMUSCULAR | Status: AC
  Administered 2014-05-20: 80 mg via INTRAMUSCULAR

## 2014-05-20 MED ORDER — MELOXICAM 15 MG PO TABS: 15.0000 mg | ORAL_TABLET | Freq: Every day | ORAL | Status: DC

## 2014-05-20 NOTE — Progress Notes (Signed)
   Subjective:    Patient ID: Craig Baird, male    DOB: [birth year], 81 y.o.   MRN: [id number]  Shoulder Pain  The pain is present in the left elbow and left shoulder. This is a new problem. The current episode started more than 1 month ago. There has been no history of extremity trauma. The problem occurs constantly. The problem has been waxing and waning. The quality of the pain is described as aching and burning. The pain is at a severity of 5/10. The pain is mild. Associated symptoms include numbness and stiffness. Pertinent negatives include no fever, inability to bear weight, joint swelling or limited range of motion. The symptoms are aggravated by lying down. He has tried rest for the symptoms. The treatment provided mild relief. Family history does not include gout or rheumatoid arthritis. His past medical history is significant for osteoarthritis.      Review of Systems  Constitutional: Negative.  Negative for fever.  HENT: Negative.   Respiratory: Negative.   Cardiovascular: Negative.   Gastrointestinal: Negative.   Endocrine: Negative.   Genitourinary: Negative.   Musculoskeletal: Positive for stiffness.  Neurological: Positive for numbness.  Hematological: Negative.   Psychiatric/Behavioral: Negative.   All other systems reviewed and are negative.      Objective:   Physical Exam  Constitutional: He is oriented to person, place, and time. He appears well-developed and well-nourished. No distress.  HENT:  Head: Normocephalic.  Right Ear: External ear normal.  Left Ear: External ear normal.  Nose: Nose normal.  Mouth/Throat: Oropharynx is clear and moist.  Eyes: Pupils are equal, round, and reactive to light. Right eye exhibits no discharge. Left eye exhibits no discharge.  Neck: Normal range of motion. Neck supple. No thyromegaly present.  Cardiovascular: Normal rate, regular rhythm, normal heart sounds and intact distal pulses.   No murmur heard. Pulmonary/Chest:  Effort normal and breath sounds normal. No respiratory distress. He has no wheezes.  Abdominal: Soft. Bowel sounds are normal. He exhibits no distension. There is no tenderness.  Musculoskeletal: Normal range of motion. He exhibits tenderness (mild tenderness in left elbow and left shoulder). He exhibits no edema.  Neurological: He is alert and oriented to person, place, and time. He has normal reflexes. No cranial nerve deficit.  Skin: Skin is warm and dry. No rash noted. No erythema.  Psychiatric: He has a normal mood and affect. His behavior is normal. Judgment and thought content normal.  Vitals reviewed.   BP 138/74 mmHg  Pulse 71  Temp(Src) 97.1 F (36.2 C) (Oral)  Ht 5\' 5"  (1.651 m)  Wt 143 lb 9.6 oz (65.137 kg)  BMI 23.90 kg/m2       Assessment & Plan:  1. Arthritis -Rest No other NSAID's - meloxicam (MOBIC) 15 MG tablet; Take 1 tablet (15 mg total) by mouth daily.  Dispense: 30 tablet; Refill: 0 - methylPREDNISolone acetate (DEPO-MEDROL) injection 80 mg; Inject 1 mL (80 mg total) into the muscle once.  2. GAD (generalized anxiety disorder) -Stress management  - citalopram (CELEXA) 20 MG tablet; Take 1 tablet (20 mg total) by mouth daily.  Dispense: 90 tablet; Refill: 4  3. Depression - citalopram (CELEXA) 20 MG tablet; Take 1 tablet (20 mg total) by mouth daily.  Dispense: 90 tablet; Refill: 4   Continue all meds Diet and exercise encouraged RTO prn  Evelina Dun, FNP

## 2014-05-20 NOTE — Patient Instructions (Signed)
Arthritis, Nonspecific °Arthritis is inflammation of a joint. This usually means pain, redness, warmth or swelling are present. One or more joints may be involved. There are a number of types of arthritis. Your caregiver may not be able to tell what type of arthritis you have right away. °CAUSES  °The most common cause of arthritis is the wear and tear on the joint (osteoarthritis). This causes damage to the cartilage, which can break down over time. The knees, hips, back and neck are most often affected by this type of arthritis. °Other types of arthritis and common causes of joint pain include: °· Sprains and other injuries near the joint. Sometimes minor sprains and injuries cause pain and swelling that develop hours later. °· Rheumatoid arthritis. This affects hands, feet and knees. It usually affects both sides of your body at the same time. It is often associated with chronic ailments, fever, weight loss and general weakness. °· Crystal arthritis. Gout and pseudo gout can cause occasional acute severe pain, redness and swelling in the foot, ankle, or knee. °· Infectious arthritis. Bacteria can get into a joint through a break in overlying skin. This can cause infection of the joint. Bacteria and viruses can also spread through the blood and affect your joints. °· Drug, infectious and allergy reactions. Sometimes joints can become mildly painful and slightly swollen with these types of illnesses. °SYMPTOMS  °· Pain is the main symptom. °· Your joint or joints can also be red, swollen and warm or hot to the touch. °· You may have a fever with certain types of arthritis, or even feel overall ill. °· The joint with arthritis will hurt with movement. Stiffness is present with some types of arthritis. °DIAGNOSIS  °Your caregiver will suspect arthritis based on your description of your symptoms and on your exam. Testing may be needed to find the type of arthritis: °· Blood and sometimes urine tests. °· X-ray tests  and sometimes CT or MRI scans. °· Removal of fluid from the joint (arthrocentesis) is done to check for bacteria, crystals or other causes. Your caregiver (or a specialist) will numb the area over the joint with a local anesthetic, and use a needle to remove joint fluid for examination. This procedure is only minimally uncomfortable. °· Even with these tests, your caregiver may not be able to tell what kind of arthritis you have. Consultation with a specialist (rheumatologist) may be helpful. °TREATMENT  °Your caregiver will discuss with you treatment specific to your type of arthritis. If the specific type cannot be determined, then the following general recommendations may apply. °Treatment of severe joint pain includes: °· Rest. °· Elevation. °· Anti-inflammatory medication (for example, ibuprofen) may be prescribed. Avoiding activities that cause increased pain. °· Only take over-the-counter or prescription medicines for pain and discomfort as recommended by your caregiver. °· Cold packs over an inflamed joint may be used for 10 to 15 minutes every hour. Hot packs sometimes feel better, but do not use overnight. Do not use hot packs if you are diabetic without your caregiver's permission. °· A cortisone shot into arthritic joints may help reduce pain and swelling. °· Any acute arthritis that gets worse over the next 1 to 2 days needs to be looked at to be sure there is no joint infection. °Long-term arthritis treatment involves modifying activities and lifestyle to reduce joint stress jarring. This can include weight loss. Also, exercise is needed to nourish the joint cartilage and remove waste. This helps keep the muscles   around the joint strong. °HOME CARE INSTRUCTIONS  °· Do not take aspirin to relieve pain if gout is suspected. This elevates uric acid levels. °· Only take over-the-counter or prescription medicines for pain, discomfort or fever as directed by your caregiver. °· Rest the joint as much as  possible. °· If your joint is swollen, keep it elevated. °· Use crutches if the painful joint is in your leg. °· Drinking plenty of fluids may help for certain types of arthritis. °· Follow your caregiver's dietary instructions. °· Try low-impact exercise such as: °¨ Swimming. °¨ Water aerobics. °¨ Biking. °¨ Walking. °· Morning stiffness is often relieved by a warm shower. °· Put your joints through regular range-of-motion. °SEEK MEDICAL CARE IF:  °· You do not feel better in 24 hours or are getting worse. °· You have side effects to medications, or are not getting better with treatment. °SEEK IMMEDIATE MEDICAL CARE IF:  °· You have a fever. °· You develop severe joint pain, swelling or redness. °· Many joints are involved and become painful and swollen. °· There is severe back pain and/or leg weakness. °· You have loss of bowel or bladder control. °Document Released: 04/06/2004 Document Revised: 05/22/2011 Document Reviewed: 04/22/2008 °[employer]® Patient Information ©2015 [employer]. This information is not intended to replace advice given to you by your health care provider. Make sure you discuss any questions you have with your health care provider. ° °

## 2014-06-03 ENCOUNTER — Ambulatory Visit: Payer: Medicare Other | Admitting: Family Medicine

## 2014-07-08 ENCOUNTER — Ambulatory Visit (INDEPENDENT_AMBULATORY_CARE_PROVIDER_SITE_OTHER): Payer: Medicare Other | Admitting: *Deleted

## 2014-07-08 ENCOUNTER — Encounter: Payer: Self-pay | Admitting: *Deleted

## 2014-07-08 VITALS — BP 128/75 | HR 83 | Ht 64.0 in | Wt 142.0 lb

## 2014-07-08 DIAGNOSIS — Z0000 Encounter for general adult medical examination without abnormal findings: Secondary | ICD-10-CM

## 2014-07-08 DIAGNOSIS — Z23 Encounter for immunization: Secondary | ICD-10-CM

## 2014-07-08 NOTE — Patient Instructions (Addendum)
You received your Prevnar 13 vaccine today- this is a pneumonia vaccine  Please return the stool cards to our office once you have completed them  Thank you for coming in for your Annual Wellness Visit today!    Preventive Care for Adults A healthy lifestyle and preventive care can promote health and wellness. Preventive health guidelines for men include the following key practices:  A routine yearly physical is a good way to check with your health care provider about your health and preventative screening. It is a chance to share any concerns and updates on your health and to receive a thorough exam.  Visit your dentist for a routine exam and preventative care every 6 months. Brush your teeth twice a day and floss once a day. Good oral hygiene prevents tooth decay and gum disease.  The frequency of eye exams is based on your age, health, family medical history, use of contact lenses, and other factors. Follow your health care provider's recommendations for frequency of eye exams.  Eat a healthy diet. Foods such as vegetables, fruits, whole grains, low-fat dairy products, and lean protein foods contain the nutrients you need without too many calories. Decrease your intake of foods high in solid fats, added sugars, and salt. Eat the right amount of calories for you.Get information about a proper diet from your health care provider, if necessary.  Regular physical exercise is one of the most important things you can do for your health. Most adults should get at least 150 minutes of moderate-intensity exercise (any activity that increases your heart rate and causes you to sweat) each week. In addition, most adults need muscle-strengthening exercises on 2 or more days a week.  Maintain a healthy weight. The body mass index (BMI) is a screening tool to identify possible weight problems. It provides an estimate of body fat based on height and weight. Your health care provider can find your BMI and  can help you achieve or maintain a healthy weight.For adults 20 years and older:  A BMI below 18.5 is considered underweight.  A BMI of 18.5 to 24.9 is normal.  A BMI of 25 to 29.9 is considered overweight.  A BMI of 30 and above is considered obese.  Maintain normal blood lipids and cholesterol levels by exercising and minimizing your intake of saturated fat. Eat a balanced diet with plenty of fruit and vegetables. Blood tests for lipids and cholesterol should begin at age 33 and be repeated every 5 years. If your lipid or cholesterol levels are high, you are over 50, or you are at high risk for heart disease, you may need your cholesterol levels checked more frequently.Ongoing high lipid and cholesterol levels should be treated with medicines if diet and exercise are not working.  If you smoke, find out from your health care provider how to quit. If you do not use tobacco, do not start.  Lung cancer screening is recommended for adults aged 4-80 years who are at high risk for developing lung cancer because of a history of smoking. A yearly low-dose CT scan of the lungs is recommended for people who have at least a 30-pack-year history of smoking and are a current smoker or have quit within the past 15 years. A pack year of smoking is smoking an average of 1 pack of cigarettes a day for 1 year (for example: 1 pack a day for 30 years or 2 packs a day for 15 years). Yearly screening should continue until the smoker  has stopped smoking for at least 15 years. Yearly screening should be stopped for people who develop a health problem that would prevent them from having lung cancer treatment.  If you choose to drink alcohol, do not have more than 2 drinks per day. One drink is considered to be 12 ounces (355 mL) of beer, 5 ounces (148 mL) of wine, or 1.5 ounces (44 mL) of liquor.  Avoid use of street drugs. Do not share needles with anyone. Ask for help if you need support or instructions about  stopping the use of drugs.  High blood pressure causes heart disease and increases the risk of stroke. Your blood pressure should be checked at least every 1-2 years. Ongoing high blood pressure should be treated with medicines, if weight loss and exercise are not effective.  If you are 20-11 years old, ask your health care provider if you should take aspirin to prevent heart disease.  Diabetes screening involves taking a blood sample to check your fasting blood sugar level. This should be done once every 3 years, after age 72, if you are within normal weight and without risk factors for diabetes. Testing should be considered at a younger age or be carried out more frequently if you are overweight and have at least 1 risk factor for diabetes.  Colorectal cancer can be detected and often prevented. Most routine colorectal cancer screening begins at the age of 81 and continues through age 55. However, your health care provider may recommend screening at an earlier age if you have risk factors for colon cancer. On a yearly basis, your health care provider may provide home test kits to check for hidden blood in the stool. Use of a small camera at the end of a tube to directly examine the colon (sigmoidoscopy or colonoscopy) can detect the earliest forms of colorectal cancer. Talk to your health care provider about this at age 53, when routine screening begins. Direct exam of the colon should be repeated every 5-10 years through age 81, unless early forms of precancerous polyps or small growths are found.  People who are at an increased risk for hepatitis B should be screened for this virus. You are considered at high risk for hepatitis B if:  You were born in a country where hepatitis B occurs often. Talk with your health care provider about which countries are considered high risk.  Your parents were born in a high-risk country and you have not received a shot to protect against hepatitis B (hepatitis B  vaccine).  You have HIV or AIDS.  You use needles to inject street drugs.  You live with, or have sex with, someone who has hepatitis B.  You are a man who has sex with other men (MSM).  You get hemodialysis treatment.  You take certain medicines for conditions such as cancer, organ transplantation, and autoimmune conditions.  Hepatitis C blood testing is recommended for all people born from [birth year] through 1965 and any individual with known risks for hepatitis C.  Practice safe sex. Use condoms and avoid high-risk sexual practices to reduce the spread of sexually transmitted infections (STIs). STIs include gonorrhea, chlamydia, syphilis, trichomonas, herpes, HPV, and human immunodeficiency virus (HIV). Herpes, HIV, and HPV are viral illnesses that have no cure. They can result in disability, cancer, and death.  If you are at risk of being infected with HIV, it is recommended that you take a prescription medicine daily to prevent HIV infection. This is called preexposure prophylaxis (  PrEP). You are considered at risk if:  You are a man who has sex with other men (MSM) and have other risk factors.  You are a heterosexual man, are sexually active, and are at increased risk for HIV infection.  You take drugs by injection.  You are sexually active with a partner who has HIV.  Talk with your health care provider about whether you are at high risk of being infected with HIV. If you choose to begin PrEP, you should first be tested for HIV. You should then be tested every 3 months for as long as you are taking PrEP.  A one-time screening for abdominal aortic aneurysm (AAA) and surgical repair of large AAAs by ultrasound are recommended for men ages 50 to 46 years who are current or former smokers.  Healthy men should no longer receive prostate-specific antigen (PSA) blood tests as part of routine cancer screening. Talk with your health care provider about prostate cancer screening.  Testicular  cancer screening is not recommended for adult males who have no symptoms. Screening includes self-exam, a health care provider exam, and other screening tests. Consult with your health care provider about any symptoms you have or any concerns you have about testicular cancer.  Use sunscreen. Apply sunscreen liberally and repeatedly throughout the day. You should seek shade when your shadow is shorter than you. Protect yourself by wearing long sleeves, pants, a wide-brimmed hat, and sunglasses year round, whenever you are outdoors.  Once a month, do a whole-body skin exam, using a mirror to look at the skin on your back. Tell your health care provider about new moles, moles that have irregular borders, moles that are larger than a pencil eraser, or moles that have changed in shape or color.  Stay current with required vaccines (immunizations).  Influenza vaccine. All adults should be immunized every year.  Tetanus, diphtheria, and acellular pertussis (Td, Tdap) vaccine. An adult who has not previously received Tdap or who does not know his vaccine status should receive 1 dose of Tdap. This initial dose should be followed by tetanus and diphtheria toxoids (Td) booster doses every 10 years. Adults with an unknown or incomplete history of completing a 3-dose immunization series with Td-containing vaccines should begin or complete a primary immunization series including a Tdap dose. Adults should receive a Td booster every 10 years.  Varicella vaccine. An adult without evidence of immunity to varicella should receive 2 doses or a second dose if he has previously received 1 dose.  Human papillomavirus (HPV) vaccine. Males aged 63-21 years who have not received the vaccine previously should receive the 3-dose series. Males aged 22-26 years may be immunized. Immunization is recommended through the age of 39 years for any male who has sex with males and did not get any or all doses earlier. Immunization is  recommended for any person with an immunocompromised condition through the age of 32 years if he did not get any or all doses earlier. During the 3-dose series, the second dose should be obtained 4-8 weeks after the first dose. The third dose should be obtained 24 weeks after the first dose and 16 weeks after the second dose.  Zoster vaccine. One dose is recommended for adults aged 79 years or older unless certain conditions are present.  Measles, mumps, and rubella (MMR) vaccine. Adults born before 60 generally are considered immune to measles and mumps. Adults born in [birth year] or later should have 1 or more doses of MMR vaccine unless  there is a contraindication to the vaccine or there is laboratory evidence of immunity to each of the three diseases. A routine second dose of MMR vaccine should be obtained at least 28 days after the first dose for students attending postsecondary schools, health care workers, or international travelers. People who received inactivated measles vaccine or an unknown type of measles vaccine during 1963-1967 should receive 2 doses of MMR vaccine. People who received inactivated mumps vaccine or an unknown type of mumps vaccine before 1979 and are at high risk for mumps infection should consider immunization with 2 doses of MMR vaccine. Unvaccinated health care workers born before [birth year] who lack laboratory evidence of measles, mumps, or rubella immunity or laboratory confirmation of disease should consider measles and mumps immunization with 2 doses of MMR vaccine or rubella immunization with 1 dose of MMR vaccine.  Pneumococcal 13-valent conjugate (PCV13) vaccine. When indicated, a person who is uncertain of his immunization history and has no record of immunization should receive the PCV13 vaccine. An adult aged 93 years or older who has certain medical conditions and has not been previously immunized should receive 1 dose of PCV13 vaccine. This PCV13 should be followed with a dose  of pneumococcal polysaccharide (PPSV23) vaccine. The PPSV23 vaccine dose should be obtained at least 8 weeks after the dose of PCV13 vaccine. An adult aged 20 years or older who has certain medical conditions and previously received 1 or more doses of PPSV23 vaccine should receive 1 dose of PCV13. The PCV13 vaccine dose should be obtained 1 or more years after the last PPSV23 vaccine dose.  Pneumococcal polysaccharide (PPSV23) vaccine. When PCV13 is also indicated, PCV13 should be obtained first. All adults aged 32 years and older should be immunized. An adult younger than age 9 years who has certain medical conditions should be immunized. Any person who resides in a nursing home or long-term care facility should be immunized. An adult smoker should be immunized. People with an immunocompromised condition and certain other conditions should receive both PCV13 and PPSV23 vaccines. People with human immunodeficiency virus (HIV) infection should be immunized as soon as possible after diagnosis. Immunization during chemotherapy or radiation therapy should be avoided. Routine use of PPSV23 vaccine is not recommended for American Indians, [place] Natives, or people younger than 65 years unless there are medical conditions that require PPSV23 vaccine. When indicated, people who have unknown immunization and have no record of immunization should receive PPSV23 vaccine. One-time revaccination 5 years after the first dose of PPSV23 is recommended for people aged 19-64 years who have chronic kidney failure, nephrotic syndrome, asplenia, or immunocompromised conditions. People who received 1-2 doses of PPSV23 before age 33 years should receive another dose of PPSV23 vaccine at age 75 years or later if at least 5 years have passed since the previous dose. Doses of PPSV23 are not needed for people immunized with PPSV23 at or after age 98 years.  Meningococcal vaccine. Adults with asplenia or persistent complement component  deficiencies should receive 2 doses of quadrivalent meningococcal conjugate (MenACWY-D) vaccine. The doses should be obtained at least 2 months apart. Microbiologists working with certain meningococcal bacteria, [place] recruits, people at risk during an outbreak, and people who travel to or live in countries with a high rate of meningitis should be immunized. A first-year college student up through age 61 years who is living in a residence hall should receive a dose if he did not receive a dose on or after his 16th birthday. Adults  who have certain high-risk conditions should receive one or more doses of vaccine.  Hepatitis A vaccine. Adults who wish to be protected from this disease, have certain high-risk conditions, work with hepatitis A-infected animals, work in hepatitis A research labs, or travel to or work in countries with a high rate of hepatitis A should be immunized. Adults who were previously unvaccinated and who anticipate close contact with an international adoptee during the first 60 days after arrival in the [place] from a country with a high rate of hepatitis A should be immunized.  Hepatitis B vaccine. Adults should be immunized if they wish to be protected from this disease, have certain high-risk conditions, may be exposed to blood or other infectious body fluids, are household contacts or sex partners of hepatitis B positive people, are clients or workers in certain care facilities, or travel to or work in countries with a high rate of hepatitis B.  Haemophilus influenzae type b (Hib) vaccine. A previously unvaccinated person with asplenia or sickle cell disease or having a scheduled splenectomy should receive 1 dose of Hib vaccine. Regardless of previous immunization, a recipient of a hematopoietic stem cell transplant should receive a 3-dose series 6-12 months after his successful transplant. Hib vaccine is not recommended for adults with HIV infection. Preventive Service /  Frequency Ages 37 to 41  Blood pressure check.** / Every 1 to 2 years.  Lipid and cholesterol check.** / Every 5 years beginning at age 33.  Hepatitis C blood test.** / For any individual with known risks for hepatitis C.  Skin self-exam. / Monthly.  Influenza vaccine. / Every year.  Tetanus, diphtheria, and acellular pertussis (Tdap, Td) vaccine.** / Consult your health care provider. 1 dose of Td every 10 years.  Varicella vaccine.** / Consult your health care provider.  HPV vaccine. / 3 doses over 6 months, if 29 or younger.  Measles, mumps, rubella (MMR) vaccine.** / You need at least 1 dose of MMR if you were born in 1955 or later. You may also need a second dose.  Pneumococcal 13-valent conjugate (PCV13) vaccine.** / Consult your health care provider.  Pneumococcal polysaccharide (PPSV23) vaccine.** / 1 to 2 doses if you smoke cigarettes or if you have certain conditions.  Meningococcal vaccine.** / 1 dose if you are age 26 to 11 years and a Market researcher living in a residence hall, or have one of several medical conditions. You may also need additional booster doses.  Hepatitis A vaccine.** / Consult your health care provider.  Hepatitis B vaccine.** / Consult your health care provider.  Haemophilus influenzae type b (Hib) vaccine.** / Consult your health care provider. Ages 46 to 59  Blood pressure check.** / Every 1 to 2 years.  Lipid and cholesterol check.** / Every 5 years beginning at age 88.  Lung cancer screening. / Every year if you are aged 42-80 years and have a 30-pack-year history of smoking and currently smoke or have quit within the past 15 years. Yearly screening is stopped once you have quit smoking for at least 15 years or develop a health problem that would prevent you from having lung cancer treatment.  Fecal occult blood test (FOBT) of stool. / Every year beginning at age 44 and continuing until age 67. You may not have to do this test  if you get a colonoscopy every 10 years.  Flexible sigmoidoscopy** or colonoscopy.** / Every 5 years for a flexible sigmoidoscopy or every 10 years for a colonoscopy  beginning at age 104 and continuing until age 76.  Hepatitis C blood test.** / For all people born from [birth year] through 1963 and any individual with known risks for hepatitis C.  Skin self-exam. / Monthly.  Influenza vaccine. / Every year.  Tetanus, diphtheria, and acellular pertussis (Tdap/Td) vaccine.** / Consult your health care provider. 1 dose of Td every 10 years.  Varicella vaccine.** / Consult your health care provider.  Zoster vaccine.** / 1 dose for adults aged 11 years or older.  Measles, mumps, rubella (MMR) vaccine.** / You need at least 1 dose of MMR if you were born in 1955 or later. You may also need a second dose.  Pneumococcal 13-valent conjugate (PCV13) vaccine.** / Consult your health care provider.  Pneumococcal polysaccharide (PPSV23) vaccine.** / 1 to 2 doses if you smoke cigarettes or if you have certain conditions.  Meningococcal vaccine.** / Consult your health care provider.  Hepatitis A vaccine.** / Consult your health care provider.  Hepatitis B vaccine.** / Consult your health care provider.  Haemophilus influenzae type b (Hib) vaccine.** / Consult your health care provider. Ages 28 and over  Blood pressure check.** / Every 1 to 2 years.  Lipid and cholesterol check.**/ Every 5 years beginning at age 67.  Lung cancer screening. / Every year if you are aged 102-80 years and have a 30-pack-year history of smoking and currently smoke or have quit within the past 15 years. Yearly screening is stopped once you have quit smoking for at least 15 years or develop a health problem that would prevent you from having lung cancer treatment.  Fecal occult blood test (FOBT) of stool. / Every year beginning at age 22 and continuing until age 29. You may not have to do this test if you get a colonoscopy  every 10 years.  Flexible sigmoidoscopy** or colonoscopy.** / Every 5 years for a flexible sigmoidoscopy or every 10 years for a colonoscopy beginning at age 33 and continuing until age 42.  Hepatitis C blood test.** / For all people born from [birth year] through 1963 and any individual with known risks for hepatitis C.  Abdominal aortic aneurysm (AAA) screening.** / A one-time screening for ages 43 to 28 years who are current or former smokers.  Skin self-exam. / Monthly.  Influenza vaccine. / Every year.  Tetanus, diphtheria, and acellular pertussis (Tdap/Td) vaccine.** / 1 dose of Td every 10 years.  Varicella vaccine.** / Consult your health care provider.  Zoster vaccine.** / 1 dose for adults aged 1 years or older.  Pneumococcal 13-valent conjugate (PCV13) vaccine.** / Consult your health care provider.  Pneumococcal polysaccharide (PPSV23) vaccine.** / 1 dose for all adults aged 71 years and older.  Meningococcal vaccine.** / Consult your health care provider.  Hepatitis A vaccine.** / Consult your health care provider.  Hepatitis B vaccine.** / Consult your health care provider.  Haemophilus influenzae type b (Hib) vaccine.** / Consult your health care provider. **Family history and personal history of risk and conditions may change your health care provider's recommendations. Document Released: 04/25/2001 Document Revised: 03/04/2013 Document Reviewed: 07/25/2010 [facility] Patient Information 2015 [place], [place]. This information is not intended to replace advice given to you by your health care provider. Make sure you discuss any questions you have with your health care provider.

## 2014-07-09 ENCOUNTER — Other Ambulatory Visit: Payer: Medicare Other

## 2014-07-09 DIAGNOSIS — Z1212 Encounter for screening for malignant neoplasm of rectum: Secondary | ICD-10-CM | POA: Diagnosis not present

## 2014-07-09 NOTE — Progress Notes (Signed)
Subjective:   Craig Baird is a 81 y.o. male who presents for an Initial Medicare Annual Wellness Visit.  He is retired from [employer], and lives at home with his wife.  They have 2 children, 3 grandchildren and 2 great grandchildren.  He stays active doing work around his home.  He has no complaints today, and states he is feeling well.   Review of Systems   Cardiac Risk Factors include: advanced age (>50men, >46 women);male gender    Objective:    Today's Vitals   07/08/14 1527  BP: 128/75  Pulse: 83  Height: 5\' 4"  (1.626 m)  Weight: 142 lb (64.411 kg)  PainSc: 0-No pain    Current Medications (verified) Outpatient Encounter Prescriptions as of 07/08/2014  Medication Sig  . aspirin EC 81 MG tablet Take 1 tablet (81 mg total) by mouth daily. (Patient taking differently: Take 81 mg by mouth daily as needed. )  . citalopram (CELEXA) 20 MG tablet Take 1 tablet (20 mg total) by mouth daily.  . fluticasone (FLONASE) 50 MCG/ACT nasal spray Place 2 sprays into both nostrils daily.  . meloxicam (MOBIC) 15 MG tablet Take 1 tablet (15 mg total) by mouth daily.  . pravastatin (PRAVACHOL) 40 MG tablet Take 1 tablet (40 mg total) by mouth daily. (Patient taking differently: Take 40 mg by mouth daily. Takes twice weekly)    Allergies (verified) Cialis and Sulfa antibiotics   History: Past Medical History  Diagnosis Date  . Anxiety   . Hernia     L inguinal hernia  . Hyperlipidemia   . Allergy    Past Surgical History  Procedure Laterality Date  . Tonsillectomy    . Thyroid cyst excision  1973   Family History  Problem Relation Age of Onset  . Hypertension Mother   . Stroke Mother   . Parkinson's disease Mother   . Hypertension Brother 55  . Heart disease Brother     Tobacco Counseling Counseling given: No   Activities of Daily Living In your present state of health, do you have any difficulty performing the following activities: 07/08/2014 04/01/2014  Hearing? N N   Vision? N N  Difficulty concentrating or making decisions? N N  Walking or climbing stairs? N N  Dressing or bathing? N N  Doing errands, shopping? N N  Preparing Food and eating ? N -  Using the Toilet? N -  In the past six months, have you accidently leaked urine? N -  Do you have problems with loss of bowel control? N -  Managing your Medications? N -  Managing your Finances? N -  Housekeeping or managing your Housekeeping? N -    Immunizations and Health Maintenance Immunization History  Administered Date(s) Administered  . Influenza Split 11/21/2012  . Influenza, High Dose Seasonal PF 11/13/2013  . Pneumococcal Polysaccharide-23 08/21/2007, 02/11/2011  . Tdap 11/23/2010   Health Maintenance Due  Topic Date Due  . PNA vac Low Risk Adult (2 of 2 - PCV13) 02/11/2012    Patient Care Team: Claretta Fraise, MD as PCP - General (Family Medicine)         Assessment:   This is a routine wellness examination for Craig Baird.   Hearing/Vision screen Patient goes to the optometrist at the eye center at [facility] in [place], [place].  He has been seen within the last year, and feels he sees well with his glasses.   States he has mild cataracts bilaterally, that the doctor said  required no treatment at this time. No hearing deficits noted during visit   Dietary issues and exercise activities discussed: Current Exercise Habits:: The patient does not participate in regular exercise at present  Patient is active around his home - mowing grass, doing repairs, but does not participate in formal exercise  Goals    None    Increase exercise to 3 times per week- 30 min per session Depression Screen PHQ 2/9 Scores 07/08/2014 04/01/2014 04/01/2014 11/10/2013  PHQ - 2 Score 0 0 0 0    Fall Risk Fall Risk  07/08/2014 04/01/2014 11/10/2013  Falls in the past year? No No No    Cognitive Function: MMSE - Mini Mental State Exam 07/08/2014  Orientation to time 5  Orientation to Place 5  Registration 3   Attention/ Calculation 0  Recall 3  Language- name 2 objects 2  Language- repeat 0  Language- follow 3 step command 3  Language- read & follow direction 1  Write a sentence 1  Copy design 1   Total- 24 Screening Tests Health Maintenance  Topic Date Due  . PNA vac Low Risk Adult (2 of 2 - PCV13) 02/11/2012  . COLONOSCOPY  11/11/2014 (Originally 02/19/1986)  . ZOSTAVAX  11/11/2014 (Originally 02/20/1996)  . INFLUENZA VACCINE  10/12/2014  . TETANUS/TDAP  11/11/2023            During the course of the visit Craig Baird was educated and counseled about the following appropriate screening and preventive services:   Vaccines to include Pneumoccal, Influenza, Hepatitis B, Td, Zostavax, HCV- declined zostavax today due to cost, Prevnar 13 given today  Electrocardiogram- recommend for next office visit   Colorectal cancer screening- declined colonoscopy  Cardiovascular disease screening- lipids screened 04/01/14  Diabetes screening-screened 11/10/13  Glaucoma screening- up to date  Nutrition counseling- discussed healthy eating with patient- lean meats, vegetables, fruits  Prostate cancer screening- up to date Patient Instructions (the written plan) were given to the patient.   Sidnie Swalley M, RN   07/09/2014    I have reviewed and agree with the above AWV documentation.  Claretta Fraise, M.D.

## 2014-07-09 NOTE — Progress Notes (Signed)
Lab only 

## 2014-07-11 LAB — FECAL OCCULT BLOOD, IMMUNOCHEMICAL: Fecal Occult Bld: NEGATIVE

## 2014-07-29 DIAGNOSIS — C44319 Basal cell carcinoma of skin of other parts of face: Secondary | ICD-10-CM | POA: Diagnosis not present

## 2014-07-29 DIAGNOSIS — L570 Actinic keratosis: Secondary | ICD-10-CM | POA: Diagnosis not present

## 2014-07-29 DIAGNOSIS — D485 Neoplasm of uncertain behavior of skin: Secondary | ICD-10-CM | POA: Diagnosis not present

## 2014-07-29 DIAGNOSIS — C44310 Basal cell carcinoma of skin of unspecified parts of face: Secondary | ICD-10-CM | POA: Diagnosis not present

## 2014-08-24 ENCOUNTER — Other Ambulatory Visit: Payer: Self-pay | Admitting: Nurse Practitioner

## 2014-08-26 ENCOUNTER — Other Ambulatory Visit: Payer: Self-pay | Admitting: Nurse Practitioner

## 2014-08-27 NOTE — Telephone Encounter (Signed)
Last seen 05/20/14 Craig Baird  This med not on [facility]

## 2014-09-02 DIAGNOSIS — L720 Epidermal cyst: Secondary | ICD-10-CM | POA: Diagnosis not present

## 2014-09-02 DIAGNOSIS — D485 Neoplasm of uncertain behavior of skin: Secondary | ICD-10-CM | POA: Diagnosis not present

## 2014-09-02 DIAGNOSIS — L089 Local infection of the skin and subcutaneous tissue, unspecified: Secondary | ICD-10-CM | POA: Diagnosis not present

## 2014-09-02 DIAGNOSIS — C44310 Basal cell carcinoma of skin of unspecified parts of face: Secondary | ICD-10-CM | POA: Diagnosis not present

## 2014-10-23 ENCOUNTER — Encounter: Payer: Self-pay | Admitting: Family Medicine

## 2014-10-23 ENCOUNTER — Ambulatory Visit (INDEPENDENT_AMBULATORY_CARE_PROVIDER_SITE_OTHER): Payer: Medicare Other | Admitting: Family Medicine

## 2014-10-23 VITALS — BP 140/83 | HR 70 | Temp 97.5°F | Ht 64.0 in | Wt 142.4 lb

## 2014-10-23 DIAGNOSIS — E785 Hyperlipidemia, unspecified: Secondary | ICD-10-CM | POA: Diagnosis not present

## 2014-10-23 DIAGNOSIS — M19012 Primary osteoarthritis, left shoulder: Secondary | ICD-10-CM | POA: Diagnosis not present

## 2014-10-23 DIAGNOSIS — E559 Vitamin D deficiency, unspecified: Secondary | ICD-10-CM

## 2014-10-23 DIAGNOSIS — M1990 Unspecified osteoarthritis, unspecified site: Secondary | ICD-10-CM | POA: Diagnosis not present

## 2014-10-23 DIAGNOSIS — M1712 Unilateral primary osteoarthritis, left knee: Secondary | ICD-10-CM | POA: Diagnosis not present

## 2014-10-23 DIAGNOSIS — F411 Generalized anxiety disorder: Secondary | ICD-10-CM | POA: Diagnosis not present

## 2014-10-23 DIAGNOSIS — G4700 Insomnia, unspecified: Secondary | ICD-10-CM | POA: Diagnosis not present

## 2014-10-23 DIAGNOSIS — Z125 Encounter for screening for malignant neoplasm of prostate: Secondary | ICD-10-CM

## 2014-10-23 LAB — POCT URINALYSIS DIPSTICK
Bilirubin, UA: NEGATIVE
Blood, UA: NEGATIVE
Glucose, UA: NEGATIVE
KETONES UA: NEGATIVE
NITRITE UA: NEGATIVE
PH UA: 7.5
Protein, UA: NEGATIVE
Spec Grav, UA: <=1.005
UROBILINOGEN UA: NEGATIVE

## 2014-10-23 LAB — POCT UA - MICROSCOPIC ONLY
CASTS, UR, LPF, POC: NEGATIVE
CRYSTALS, UR, HPF, POC: NEGATIVE
Mucus, UA: NEGATIVE
RBC, urine, microscopic: NEGATIVE
WBC, Ur, HPF, POC: NEGATIVE
YEAST UA: NEGATIVE

## 2014-10-23 MED ORDER — TRAZODONE HCL 150 MG PO TABS: 150.0000 mg | ORAL_TABLET | Freq: Every day | ORAL | Status: DC

## 2014-10-23 MED ORDER — ESCITALOPRAM OXALATE 10 MG PO TABS: 10.0000 mg | ORAL_TABLET | Freq: Every day | ORAL | Status: DC

## 2014-10-23 NOTE — Progress Notes (Signed)
Subjective:  Patient ID: Craig Baird, male    DOB: Jul 06, 1933  Age: 81 y.o. MRN: [id number]  CC: Follow-up   HPI Craig Baird presents for  follow-up of hypertension. Patient has no history of headache chest pain or shortness of breath or recent cough. Patient also denies symptoms of TIA such as numbness weakness lateralizing. Patient checks  blood pressure at home and has not had any elevated readings recently. Patient denies side effects from his medication. States taking it regularly. Patient in for follow-up of elevated cholesterol. He+ discontinued pravastatin. This was based on anxiety regarding side effects. Denies occurrence of side effects of statin including myalgia and arthralgia and nausea. Also in today for liver function testing. Currently no chest pain, shortness of breath or other cardiovascular related symptoms noted. States he is nervous, doesn't sleep well. NEver has. Feels tired all the time.Lays awake 8-9 hours a night.He is a Research officer, trade [employer]. Feels sense of bad things to happen. Today he states he does not believe in physicals. However he would like to have some extra blood work for prostate and other screening as needed. He declines rectal exam but agrees to take an IFOB home with him.  Pt. Having L shoulder & left knee pain. Hx of arthritis. Each has required injection in the past. That works well for him unfortunately his anti-inflammatory Not. He would like to have those injected today.   History Joab has a past medical history of Anxiety; Hernia; Hyperlipidemia; and Allergy.   He has past surgical history that includes Tonsillectomy and Thyroid cyst excision (1973).   His family history includes Heart disease in his brother; Hypertension in his mother; Hypertension (age of onset: 81) in his brother; Parkinson's disease in his mother; Stroke in his mother.He reports that he has never smoked. He has never used smokeless tobacco. He reports that he does not drink  alcohol or use illicit drugs.  Current Outpatient Prescriptions on File Prior to Visit  Medication Sig Dispense Refill  . aspirin EC 81 MG tablet Take 1 tablet (81 mg total) by mouth daily. (Patient taking differently: Take 81 mg by mouth daily as needed. ) 100 tablet 11  . cetirizine (ZYRTEC) 10 MG tablet TAKE ONE TABLET BY MOUTH ONE TIME DAILY 90 tablet 3  . fluticasone (FLONASE) 50 MCG/ACT nasal spray USE 2 SPRAYS IN BOTH NOSTRIL ONCE DAILY 16 g 2  . meloxicam (MOBIC) 15 MG tablet Take 1 tablet (15 mg total) by mouth daily. 30 tablet 0   No current facility-administered medications on file prior to visit.    ROS Review of Systems  Constitutional: Negative for fever, chills and diaphoresis.  HENT: Negative for congestion, rhinorrhea and sore throat.   Respiratory: Negative for cough, shortness of breath and wheezing.   Cardiovascular: Negative for chest pain.  Gastrointestinal: Negative for nausea, vomiting, abdominal pain, diarrhea, constipation and abdominal distention.  Genitourinary: Negative for dysuria and frequency.  Musculoskeletal: Positive for joint swelling (knees) and arthralgias.  Skin: Negative for rash.  Neurological: Negative for headaches.    Objective:  BP 140/83 mmHg  Pulse 70  Temp(Src) 97.5 F (36.4 C) (Oral)  Ht _0  (1.626 m)  Wt 142 lb 6.4 oz (64.592 kg)  BMI 24.43 kg/m2  BP Readings from Last 3 Encounters:  10/23/14 140/83  07/08/14 128/75  05/20/14 138/74    Wt Readings from Last 3 Encounters:  10/23/14 142 lb 6.4 oz (64.592 kg)  07/08/14 142 lb (64.411 kg)  05/20/14 143  lb 9.6 oz (65.137 kg)     Physical Exam  Constitutional: He is oriented to person, place, and time. He appears well-developed and well-nourished. No distress.  HENT:  Head: Normocephalic and atraumatic.  Right Ear: External ear normal.  Left Ear: External ear normal.  Nose: Nose normal.  Mouth/Throat: Oropharynx is clear and moist.  Eyes: Conjunctivae and EOM are  normal. Pupils are equal, round, and reactive to light.  Neck: Normal range of motion. Neck supple. No thyromegaly present.  Cardiovascular: Normal rate, regular rhythm and normal heart sounds.   No murmur heard. Pulmonary/Chest: Effort normal and breath sounds normal. No respiratory distress. He has no wheezes. He has no rales.  Abdominal: Soft. Bowel sounds are normal. He exhibits no distension. There is no tenderness.  Musculoskeletal: He exhibits tenderness (left shoulder and knee for palpation of glenohumeral region anteriorly and posteriorly as well as left knee at anterior joint line).  Lymphadenopathy:    He has no cervical adenopathy.  Neurological: He is alert and oriented to person, place, and time. He has normal reflexes.  Skin: Skin is warm and dry.  Psychiatric: He has a normal mood and affect. His behavior is normal. Judgment and thought content normal.    No results found for: HGBA1C  Lab Results  Component Value Date   WBC 5.1 10/23/2014   HGB 14.8 11/10/2013   HCT 43.6 10/23/2014   GLUCOSE 92 10/23/2014   CHOL 245* 10/23/2014   TRIG 98 10/23/2014   HDL 57 10/23/2014   LDLCALC 168* 10/23/2014   ALT 17 10/23/2014   AST 22 10/23/2014   NA 148* 10/23/2014   K 4.5 10/23/2014   CL 104 10/23/2014   CREATININE 0.83 10/23/2014   BUN 11 10/23/2014   CO2 25 10/23/2014   TSH 2.370 11/10/2013   PSA 1.9 10/23/2014    No results found.  Assessment & Plan:   Craig Baird was seen today for follow-up.  Diagnoses and all orders for this visit:  GAD (generalized anxiety disorder) -     Cancel: CBC with Differential/Platelet -     CMP14+EGFR -     CBC with Differential/Platelet -     POCT urinalysis dipstick -     POCT UA - Microscopic Only  Insomnia -     Cancel: CBC with Differential/Platelet -     CMP14+EGFR -     CBC with Differential/Platelet -     POCT urinalysis dipstick -     POCT UA - Microscopic Only  Arthritis -     PR DRAIN/INJECT LARGE JOINT/BURSA -      Cancel: CBC with Differential/Platelet -     CMP14+EGFR -     CBC with Differential/Platelet -     CBC with Differential/Platelet -     POCT urinalysis dipstick -     POCT UA - Microscopic Only  Hyperlipidemia -     Cancel: CBC with Differential/Platelet -     CMP14+EGFR -     Lipid panel -     CBC with Differential/Platelet -     POCT urinalysis dipstick -     POCT UA - Microscopic Only  Vitamin D deficiency -     Cancel: CBC with Differential/Platelet -     CMP14+EGFR -     Vit D  25 hydroxy (rtn osteoporosis monitoring) -     CBC with Differential/Platelet -     POCT urinalysis dipstick -     POCT UA - Microscopic Only  Screening for prostate cancer -     Cancel: CBC with Differential/Platelet -     CMP14+EGFR -     PSA, total and free -     CBC with Differential/Platelet -     POCT urinalysis dipstick -     POCT UA - Microscopic Only  Other orders -     traZODone (DESYREL) 150 MG tablet; Take 1 tablet (150 mg total) by mouth at bedtime. -     escitalopram (LEXAPRO) 10 MG tablet; Take 1 tablet (10 mg total) by mouth daily.   I have discontinued Mr. Degregorio pravastatin and citalopram. I am also having him start on traZODone and escitalopram. Additionally, I am having him maintain his aspirin EC, meloxicam, fluticasone, and cetirizine.  Meds ordered this encounter  Medications  . traZODone (DESYREL) 150 MG tablet    Sig: Take 1 tablet (150 mg total) by mouth at bedtime.    Dispense:  30 tablet    Refill:  2  . escitalopram (LEXAPRO) 10 MG tablet    Sig: Take 1 tablet (10 mg total) by mouth daily.    Dispense:  30 tablet    Refill:  5   A steroid injection was performed at the left shoulder using a posterior approach with coracoid as landmark. Following sterile prep and drape the joint was injected using 1% plain Lidocaine and 6/6 mg of Celestone. This was well tolerated.  A steroid injection was performed at the left knee using lateral approach with the  patellar and tibial margins as landmark. Following sterile prep and drape the joint was injected using 1% plain Lidocaine and 6/6 mg of Celestone. This was well tolerated.    Follow-up: Return in about 6 months (around 04/25/2015).  Claretta Fraise, M.D.

## 2014-10-23 NOTE — Patient Instructions (Signed)
DASH Eating Plan °DASH stands for "Dietary Approaches to Stop Hypertension." The DASH eating plan is a healthy eating plan that has been shown to reduce high blood pressure (hypertension). Additional health benefits may include reducing the risk of type 2 diabetes mellitus, heart disease, and stroke. The DASH eating plan may also help with weight loss. °WHAT DO I NEED TO KNOW ABOUT THE DASH EATING PLAN? °For the DASH eating plan, you will follow these general guidelines: °· Choose foods with a percent daily value for sodium of less than 5% (as listed on the food label). °· Use salt-free seasonings or herbs instead of table salt or sea salt. °· Check with your health care provider or pharmacist before using salt substitutes. °· Eat lower-sodium products, often labeled as "lower sodium" or "no salt added." °· Eat fresh foods. °· Eat more vegetables, fruits, and low-fat dairy products. °· Choose whole grains. Look for the word "whole" as the first word in the ingredient list. °· Choose fish and skinless chicken or turkey more often than red meat. Limit fish, poultry, and meat to 6 oz (170 g) each day. °· Limit sweets, desserts, sugars, and sugary drinks. °· Choose heart-healthy fats. °· Limit cheese to 1 oz (28 g) per day. °· Eat more home-cooked food and less restaurant, buffet, and fast food. °· Limit fried foods. °· Cook foods using methods other than frying. °· Limit canned vegetables. If you do use them, rinse them well to decrease the sodium. °· When eating at a restaurant, ask that your food be prepared with less salt, or no salt if possible. °WHAT FOODS CAN I EAT? °Seek help from a dietitian for individual calorie needs. °Grains °Whole grain or whole wheat bread. Brown rice. Whole grain or whole wheat pasta. Quinoa, bulgur, and whole grain cereals. Low-sodium cereals. Corn or whole wheat flour tortillas. Whole grain cornbread. Whole grain crackers. Low-sodium crackers. °Vegetables °Fresh or frozen vegetables  (raw, steamed, roasted, or grilled). Low-sodium or reduced-sodium tomato and vegetable juices. Low-sodium or reduced-sodium tomato sauce and paste. Low-sodium or reduced-sodium canned vegetables.  °Fruits °All fresh, canned (in natural juice), or frozen fruits. °Meat and Other Protein Products °Ground beef (85% or leaner), grass-fed beef, or beef trimmed of fat. Skinless chicken or turkey. Ground chicken or turkey. Pork trimmed of fat. All fish and seafood. Eggs. Dried beans, peas, or lentils. Unsalted nuts and seeds. Unsalted canned beans. °Dairy °Low-fat dairy products, such as skim or 1% milk, 2% or reduced-fat cheeses, low-fat ricotta or cottage cheese, or plain low-fat yogurt. Low-sodium or reduced-sodium cheeses. °Fats and Oils °Tub margarines without trans fats. Light or reduced-fat mayonnaise and salad dressings (reduced sodium). Avocado. Safflower, olive, or canola oils. Natural peanut or almond butter. °Other °Unsalted popcorn and pretzels. °The items listed above may not be a complete list of recommended foods or beverages. Contact your dietitian for more options. °WHAT FOODS ARE NOT RECOMMENDED? °Grains °White bread. White pasta. White rice. Refined cornbread. Bagels and croissants. Crackers that contain trans fat. °Vegetables °Creamed or fried vegetables. Vegetables in a cheese sauce. Regular canned vegetables. Regular canned tomato sauce and paste. Regular tomato and vegetable juices. °Fruits °Dried fruits. Canned fruit in light or heavy syrup. Fruit juice. °Meat and Other Protein Products °Fatty cuts of meat. Ribs, chicken wings, bacon, sausage, bologna, salami, chitterlings, fatback, hot dogs, bratwurst, and packaged luncheon meats. Salted nuts and seeds. Canned beans with salt. °Dairy °Whole or 2% milk, cream, half-and-half, and cream cheese. Whole-fat or sweetened yogurt. Full-fat   cheeses or blue cheese. Nondairy creamers and whipped toppings. Processed cheese, cheese spreads, or cheese  curds. °Condiments °Onion and garlic salt, seasoned salt, table salt, and sea salt. Canned and packaged gravies. Worcestershire sauce. Tartar sauce. Barbecue sauce. Teriyaki sauce. Soy sauce, including reduced sodium. Steak sauce. Fish sauce. Oyster sauce. Cocktail sauce. Horseradish. Ketchup and mustard. Meat flavorings and tenderizers. Bouillon cubes. Hot sauce. Tabasco sauce. Marinades. Taco seasonings. Relishes. °Fats and Oils °Butter, stick margarine, lard, shortening, ghee, and bacon fat. Coconut, palm kernel, or palm oils. Regular salad dressings. °Other °Pickles and olives. Salted popcorn and pretzels. °The items listed above may not be a complete list of foods and beverages to avoid. Contact your dietitian for more information. °WHERE CAN I FIND MORE INFORMATION? °National Heart, Lung, and Blood Institute: www.nhlbi.nih.gov/health/health-topics/topics/dash/ °Document Released: 02/16/2011 Document Revised: 07/14/2013 Document Reviewed: 01/01/2013 °ExitCare® Patient Information ©2015 [employer]. This information is not intended to replace advice given to you by your health care provider. Make sure you discuss any questions you have with your health care provider. ° °

## 2014-10-24 LAB — CBC WITH DIFFERENTIAL/PLATELET
Basophils Absolute: 0.1 10*3/uL (ref 0.0–0.2)
Basos: 1 %
EOS (ABSOLUTE): 0.2 10*3/uL (ref 0.0–0.4)
Eos: 4 %
Hematocrit: 43.6 % (ref 37.5–51.0)
Hemoglobin: 14.8 g/dL (ref 12.6–17.7)
IMMATURE GRANS (ABS): 0 10*3/uL (ref 0.0–0.1)
Immature Granulocytes: 0 %
Lymphocytes Absolute: 1.6 10*3/uL (ref 0.7–3.1)
Lymphs: 32 %
MCH: 32.4 pg (ref 26.6–33.0)
MCHC: 33.9 g/dL (ref 31.5–35.7)
MCV: 95 fL (ref 79–97)
Monocytes Absolute: 0.4 10*3/uL (ref 0.1–0.9)
Monocytes: 7 %
NEUTROS ABS: 2.8 10*3/uL (ref 1.4–7.0)
Neutrophils: 56 %
PLATELETS: 243 10*3/uL (ref 150–379)
RBC: 4.57 x10E6/uL (ref 4.14–5.80)
RDW: 13.8 % (ref 12.3–15.4)
WBC: 5.1 10*3/uL (ref 3.4–10.8)

## 2014-10-24 LAB — CMP14+EGFR
A/G RATIO: 1.8 (ref 1.1–2.5)
ALT: 17 IU/L (ref 0–44)
AST: 22 IU/L (ref 0–40)
Albumin: 4.4 g/dL (ref 3.5–4.8)
Alkaline Phosphatase: 84 IU/L (ref 39–117)
BUN/Creatinine Ratio: 13 (ref 10–22)
BUN: 11 mg/dL (ref 8–27)
Bilirubin Total: 0.4 mg/dL (ref 0.0–1.2)
CALCIUM: 9.3 mg/dL (ref 8.6–10.2)
CO2: 25 mmol/L (ref 18–29)
CREATININE: 0.83 mg/dL (ref 0.76–1.27)
Chloride: 104 mmol/L (ref 97–108)
GFR calc Af Amer: 97 mL/min/{1.73_m2} (ref >59)
GFR calc non Af Amer: 84 mL/min/{1.73_m2} (ref >59)
GLOBULIN, TOTAL: 2.4 g/dL (ref 1.5–4.5)
GLUCOSE: 92 mg/dL (ref 65–99)
Potassium: 4.5 mmol/L (ref 3.5–5.2)
SODIUM: 148 mmol/L — AB (ref 134–144)
TOTAL PROTEIN: 6.8 g/dL (ref 6.0–8.5)

## 2014-10-24 LAB — LIPID PANEL
Chol/HDL Ratio: 4.3 ratio units (ref 0.0–5.0)
Cholesterol, Total: 245 mg/dL — ABNORMAL HIGH (ref 100–199)
HDL: 57 mg/dL (ref >39)
LDL CALC: 168 mg/dL — AB (ref 0–99)
Triglycerides: 98 mg/dL (ref 0–149)
VLDL Cholesterol Cal: 20 mg/dL (ref 5–40)

## 2014-10-24 LAB — PSA, TOTAL AND FREE
PROSTATE SPECIFIC AG, SERUM: 1.9 ng/mL (ref 0.0–4.0)
PSA FREE PCT: 16.8 %
PSA, Free: 0.32 ng/mL

## 2014-10-24 LAB — VITAMIN D 25 HYDROXY (VIT D DEFICIENCY, FRACTURES): Vit D, 25-Hydroxy: 29.1 ng/mL — ABNORMAL LOW (ref 30.0–100.0)

## 2014-10-26 ENCOUNTER — Other Ambulatory Visit: Payer: Self-pay | Admitting: *Deleted

## 2014-10-26 MED ORDER — VITAMIN D (ERGOCALCIFEROL) 1.25 MG (50000 UNIT) PO CAPS: ORAL_CAPSULE | ORAL | Status: DC

## 2014-10-26 MED ORDER — EZETIMIBE 10 MG PO TABS: 10.0000 mg | ORAL_TABLET | Freq: Every day | ORAL | Status: DC

## 2014-10-29 ENCOUNTER — Other Ambulatory Visit: Payer: Self-pay | Admitting: *Deleted

## 2014-10-29 MED ORDER — FENOFIBRATE 145 MG PO TABS: 145.0000 mg | ORAL_TABLET | Freq: Every day | ORAL | Status: DC

## 2014-10-29 NOTE — Progress Notes (Signed)
zetia too expensive Changed to Fenofibrate Okayed per Dr Livia Snellen

## 2014-11-25 DIAGNOSIS — H1013 Acute atopic conjunctivitis, bilateral: Secondary | ICD-10-CM | POA: Diagnosis not present

## 2014-11-25 DIAGNOSIS — H40033 Anatomical narrow angle, bilateral: Secondary | ICD-10-CM | POA: Diagnosis not present

## 2015-01-05 DIAGNOSIS — D485 Neoplasm of uncertain behavior of skin: Secondary | ICD-10-CM | POA: Diagnosis not present

## 2015-01-05 DIAGNOSIS — L570 Actinic keratosis: Secondary | ICD-10-CM | POA: Diagnosis not present

## 2015-01-05 DIAGNOSIS — L299 Pruritus, unspecified: Secondary | ICD-10-CM | POA: Diagnosis not present

## 2015-01-30 ENCOUNTER — Ambulatory Visit (INDEPENDENT_AMBULATORY_CARE_PROVIDER_SITE_OTHER): Payer: Medicare Other | Admitting: Family Medicine

## 2015-01-30 VITALS — BP 137/79 | HR 74 | Temp 96.8°F | Ht 64.0 in | Wt 144.8 lb

## 2015-01-30 DIAGNOSIS — J0191 Acute recurrent sinusitis, unspecified: Secondary | ICD-10-CM

## 2015-01-30 MED ORDER — AMOXICILLIN-POT CLAVULANATE 875-125 MG PO TABS: 1.0000 | ORAL_TABLET | Freq: Two times a day (BID) | ORAL | Status: DC

## 2015-01-30 NOTE — Progress Notes (Signed)
   HPI  Patient presents today here with upper respiratory infection.  Patient explains that for 7-8 days he's had persistent rhinorrhea, congestion, postnasal drip, frequent throat clearing, cough, and is developing sinus pain. He states that he seems to be getting worse despite using Flonase and Zyrtec for one week. He previously had good improvement with the drugs, however he developed an allergy to that.   He denies fever, chills, malaise, dyspnea, chest pain ear pain He does have sore throat as well  PMH: Smoking status noted ROS: Per HPI  Objective: BP 137/79 mmHg  Pulse 74  Temp(Src) 96.8 F (36 C) (Oral)  Ht 5\' 4"  (1.626 m)  Wt 144 lb 12.8 oz (65.681 kg)  BMI 24.84 kg/m2 Gen: NAD, alert, cooperative with exam HEENT: NCAT, tMs normal bilaterally, nares erythematous without much swelling, oropharynx with cobblestoning in the posterior pharynx as well as mucus present, no sinus tenderness to palpation CV: RRR, good S1/S2, no murmur Resp: CTABL, no wheezes, non-labored Ext: No edema, warm Neuro: Alert and oriented, No gross deficits  Assessment and plan:  # Acute sinusitis Illness worsening and not resolving with severe sinus symptoms after 7 days Treat with augmentin Maximize flonase to BID for 1 week.  Continue zyrtec, RTC if worsening or not improving.,     Meds ordered this encounter  Medications  . Cholecalciferol (VITAMIN D) 2000 UNITS CAPS    Sig: Take 1 capsule by mouth daily.    Laroy Apple, MD [facility] 01/30/2015, 9:40 AM

## 2015-01-30 NOTE — Patient Instructions (Signed)
Great to meet you!  Sinusitis, Adult Sinusitis is redness, soreness, and inflammation of the paranasal sinuses. Paranasal sinuses are air pockets within the bones of your face. They are located beneath your eyes, in the middle of your forehead, and above your eyes. In healthy paranasal sinuses, mucus is able to drain out, and air is able to circulate through them by way of your nose. However, when your paranasal sinuses are inflamed, mucus and air can become trapped. This can allow bacteria and other germs to grow and cause infection. Sinusitis can develop quickly and last only a short time (acute) or continue over a long period (chronic). Sinusitis that lasts for more than 12 weeks is considered chronic. CAUSES Causes of sinusitis include:  Allergies.  Structural abnormalities, such as displacement of the cartilage that separates your nostrils (deviated septum), which can decrease the air flow through your nose and sinuses and affect sinus drainage.  Functional abnormalities, such as when the small hairs (cilia) that line your sinuses and help remove mucus do not work properly or are not present. SIGNS AND SYMPTOMS Symptoms of acute and chronic sinusitis are the same. The primary symptoms are pain and pressure around the affected sinuses. Other symptoms include:  Upper toothache.  Earache.  Headache.  Bad breath.  Decreased sense of smell and taste.  A cough, which worsens when you are lying flat.  Fatigue.  Fever.  Thick drainage from your nose, which often is green and may contain pus (purulent).  Swelling and warmth over the affected sinuses. DIAGNOSIS Your health care provider will perform a physical exam. During your exam, your health care provider may perform any of the following to help determine if you have acute sinusitis or chronic sinusitis:  Look in your nose for signs of abnormal growths in your nostrils (nasal polyps).  Tap over the affected sinus to check for  signs of infection.  View the inside of your sinuses using an imaging device that has a light attached (endoscope). If your health care provider suspects that you have chronic sinusitis, one or more of the following tests may be recommended:  Allergy tests.  Nasal culture. A sample of mucus is taken from your nose, sent to a lab, and screened for bacteria.  Nasal cytology. A sample of mucus is taken from your nose and examined by your health care provider to determine if your sinusitis is related to an allergy. TREATMENT Most cases of acute sinusitis are related to a viral infection and will resolve on their own within 10 days. Sometimes, medicines are prescribed to help relieve symptoms of both acute and chronic sinusitis. These may include pain medicines, decongestants, nasal steroid sprays, or saline sprays. However, for sinusitis related to a bacterial infection, your health care provider will prescribe antibiotic medicines. These are medicines that will help kill the bacteria causing the infection. Rarely, sinusitis is caused by a fungal infection. In these cases, your health care provider will prescribe antifungal medicine. For some cases of chronic sinusitis, surgery is needed. Generally, these are cases in which sinusitis recurs more than 3 times per year, despite other treatments. HOME CARE INSTRUCTIONS  Drink plenty of water. Water helps thin the mucus so your sinuses can drain more easily.  Use a humidifier.  Inhale steam 3-4 times a day (for example, sit in the bathroom with the shower running).  Apply a warm, moist washcloth to your face 3-4 times a day, or as directed by your health care provider.  Use   saline nasal sprays to help moisten and clean your sinuses.  Take medicines only as directed by your health care provider.  If you were prescribed either an antibiotic or antifungal medicine, finish it all even if you start to feel better. SEEK IMMEDIATE MEDICAL CARE  IF:  You have increasing pain or severe headaches.  You have nausea, vomiting, or drowsiness.  You have swelling around your face.  You have vision problems.  You have a stiff neck.  You have difficulty breathing.   This information is not intended to replace advice given to you by your health care provider. Make sure you discuss any questions you have with your health care provider.   Document Released: 02/27/2005 Document Revised: 03/20/2014 Document Reviewed: 03/14/2011 Elsevier Interactive Patient Education 2016 Elsevier Inc.  

## 2015-02-16 ENCOUNTER — Ambulatory Visit (INDEPENDENT_AMBULATORY_CARE_PROVIDER_SITE_OTHER): Payer: Medicare Other | Admitting: Family Medicine

## 2015-02-16 ENCOUNTER — Encounter: Payer: Self-pay | Admitting: Family Medicine

## 2015-02-16 VITALS — BP 133/71 | HR 72 | Temp 97.0°F | Ht 64.0 in | Wt 145.6 lb

## 2015-02-16 DIAGNOSIS — L299 Pruritus, unspecified: Secondary | ICD-10-CM

## 2015-02-16 MED ORDER — PRAVASTATIN SODIUM 40 MG PO TABS: 40.0000 mg | ORAL_TABLET | Freq: Every day | ORAL | Status: DC

## 2015-02-16 MED ORDER — SERTRALINE HCL 50 MG PO TABS: 50.0000 mg | ORAL_TABLET | Freq: Every day | ORAL | Status: DC

## 2015-02-16 NOTE — Progress Notes (Signed)
   HPI  Patient presents today discussed itching.  Patient's states over the last 3 months he's had diffuse itching with no rash. He states that about that time he started pravastatin as well as Lexapro. He has not started any other new medications and has no new allergen exposures  He has seen his dermatologist who recommended a specific lotion which she's tried. He denies dyspnea, fever, chest pain.  He states lexapro is not helping and would lik eto change No SI  PMH: Smoking status noted ROS: Per HPI  Objective: BP 133/71 mmHg  Pulse 72  Temp(Src) 97 F (36.1 C) (Oral)  Ht 5\' 4"  (1.626 m)  Wt 145 lb 9.6 oz (66.044 kg)  BMI 24.98 kg/m2 Gen: NAD, alert, cooperative with exam HEENT: NCAT CV: RRR, good S1/S2, no murmur Resp: CTABL, no wheezes, non-labored Abd: SNTND, BS present, no guarding or organomegaly Ext: No edema, warm Neuro: Alert and oriented, No gross deficits Skin- no rash on sun exposed areas  Assessment and plan:  # pruritis He would like to change anxiety pills,I have changed from lexapro to zoloft.  Continue lotion, add zyrtec I am unsure this is really the etiology but I explained we can follow up and contu eto monitor his symptoms making small changes that we can hopefully monitor for results.  Possibly just xerosis    Meds ordered this encounter  Medications  . pravastatin (PRAVACHOL) 40 MG tablet    Sig: Take 40 mg by mouth daily.  . sertraline (ZOLOFT) 50 MG tablet    Sig: Take 1 tablet (50 mg total) by mouth daily.    Dispense:  30 tablet    Refill:  Nubieber, MD [facility] 02/16/2015, 9:00 AM

## 2015-02-16 NOTE — Patient Instructions (Signed)
Great to see you again!  Lets see you back in 3-4 weeks  Stop lexapro, start zoloft  Also try a daily plain zyrtec to see if it helps your itching.   Also continue the lotion the dermatologist recommended.

## 2015-04-05 ENCOUNTER — Encounter: Payer: Self-pay | Admitting: Family Medicine

## 2015-04-05 ENCOUNTER — Ambulatory Visit (INDEPENDENT_AMBULATORY_CARE_PROVIDER_SITE_OTHER): Payer: Medicare Other | Admitting: Family Medicine

## 2015-04-05 VITALS — BP 157/75 | HR 69 | Temp 97.3°F | Ht 64.0 in | Wt 146.0 lb

## 2015-04-05 DIAGNOSIS — E785 Hyperlipidemia, unspecified: Secondary | ICD-10-CM

## 2015-04-05 DIAGNOSIS — L309 Dermatitis, unspecified: Secondary | ICD-10-CM

## 2015-04-05 MED ORDER — HYDROCORTISONE 1 % EX LOTN: 1.0000 "application " | TOPICAL_LOTION | Freq: Two times a day (BID) | CUTANEOUS | Status: DC

## 2015-04-05 MED ORDER — HYDROXYZINE HCL 10 MG PO TABS
10.0000 mg | ORAL_TABLET | Freq: Three times a day (TID) | ORAL | Status: DC | PRN
Start: 2015-04-05 — End: 2015-06-16

## 2015-04-05 NOTE — Progress Notes (Signed)
   Subjective:    Patient ID: Craig Baird, male    DOB: 1933/07/06, 82 y.o.   MRN: [id number]  HPI Patient here today for rash that started several months ago. He states is comes and goes. He has seen a dermatologist as well as another provider in this practice. The rash persists. It is primarily on his chest and back today but he says that it has appeared on his legs as well.      Patient Active Problem List   Diagnosis Date Noted  . GAD (generalized anxiety disorder) 10/23/2014  . Insomnia 10/23/2014  . Arthritis 10/23/2014  . Hyperlipidemia 10/23/2014  . Allergic rhinitis 08/12/2012  . Anxiety state, unspecified 08/12/2012   Outpatient Encounter Prescriptions as of 04/05/2015  Medication Sig  . aspirin EC 81 MG tablet Take 1 tablet (81 mg total) by mouth daily. (Patient taking differently: Take 81 mg by mouth daily as needed. )  . cetirizine (ZYRTEC) 10 MG tablet TAKE ONE TABLET BY MOUTH ONE TIME DAILY  . escitalopram (LEXAPRO) 10 MG tablet Take 10 mg by mouth daily.  . pravastatin (PRAVACHOL) 40 MG tablet Take 1 tablet (40 mg total) by mouth daily.  . [DISCONTINUED] fluticasone (FLONASE) 50 MCG/ACT nasal spray USE 2 SPRAYS IN BOTH NOSTRIL ONCE DAILY (Patient not taking: Reported on 02/16/2015)  . [DISCONTINUED] sertraline (ZOLOFT) 50 MG tablet Take 1 tablet (50 mg total) by mouth daily.   No facility-administered encounter medications on file as of 04/05/2015.     Review of Systems  Constitutional: Negative.   HENT: Negative.   Eyes: Negative.   Respiratory: Negative.   Cardiovascular: Negative.   Gastrointestinal: Negative.   Endocrine: Negative.   Genitourinary: Negative.   Musculoskeletal: Negative.   Skin: Positive for rash.  Allergic/Immunologic: Negative.   Neurological: Negative.   Hematological: Negative.   Psychiatric/Behavioral: Negative.        Objective:   Physical Exam  Constitutional: He appears well-developed and well-nourished.  Skin:  Rash  on chest and back is papular with scabs. It is suggestive of a neurodermatitis. Patient does endorse anxiety.   BP 157/75 mmHg  Pulse 69  Temp(Src) 97.3 F (36.3 C) (Oral)  Ht _0  (1.626 m)  Wt 146 lb (66.225 kg)  BMI 25.05 kg/m2        Assessment & Plan:  1. Hyperlipidemia Lipids were not at goal when last checked. If LDL is still elevated as much consider increased dose or change to different more potent statin - Lipid panel - CMP14+EGFR  2. Dermatitis Triamcinolone lotion and Atarax  Wardell Honour MD

## 2015-04-06 LAB — CMP14+EGFR
ALBUMIN: 4 g/dL (ref 3.5–4.8)
ALK PHOS: 92 IU/L (ref 39–117)
ALT: 16 IU/L (ref 0–44)
AST: 21 IU/L (ref 0–40)
Albumin/Globulin Ratio: 1.7 (ref 1.1–2.5)
BUN / CREAT RATIO: 13 (ref 10–22)
BUN: 11 mg/dL (ref 8–27)
Bilirubin Total: 0.3 mg/dL (ref 0.0–1.2)
CALCIUM: 8.8 mg/dL (ref 8.6–10.2)
CO2: 29 mmol/L (ref 18–29)
CREATININE: 0.86 mg/dL (ref 0.76–1.27)
Chloride: 102 mmol/L (ref 96–106)
GFR calc non Af Amer: 82 mL/min/{1.73_m2} (ref >59)
GFR, EST AFRICAN AMERICAN: 95 mL/min/{1.73_m2} (ref >59)
GLUCOSE: 121 mg/dL — AB (ref 65–99)
Globulin, Total: 2.3 g/dL (ref 1.5–4.5)
Potassium: 3.9 mmol/L (ref 3.5–5.2)
Sodium: 144 mmol/L (ref 134–144)
TOTAL PROTEIN: 6.3 g/dL (ref 6.0–8.5)

## 2015-04-06 LAB — LIPID PANEL
Chol/HDL Ratio: 3.7 ratio units (ref 0.0–5.0)
Cholesterol, Total: 208 mg/dL — ABNORMAL HIGH (ref 100–199)
HDL: 56 mg/dL (ref >39)
LDL Calculated: 129 mg/dL — ABNORMAL HIGH (ref 0–99)
Triglycerides: 117 mg/dL (ref 0–149)
VLDL Cholesterol Cal: 23 mg/dL (ref 5–40)

## 2015-04-26 ENCOUNTER — Ambulatory Visit: Payer: Self-pay | Admitting: Family Medicine

## 2015-05-24 ENCOUNTER — Other Ambulatory Visit: Payer: Self-pay | Admitting: Family Medicine

## 2015-06-16 ENCOUNTER — Ambulatory Visit (INDEPENDENT_AMBULATORY_CARE_PROVIDER_SITE_OTHER): Payer: Medicare Other | Admitting: Family Medicine

## 2015-06-16 ENCOUNTER — Encounter: Payer: Self-pay | Admitting: Family Medicine

## 2015-06-16 VITALS — BP 143/74 | HR 70 | Temp 97.2°F | Ht 64.0 in | Wt 146.2 lb

## 2015-06-16 DIAGNOSIS — L309 Dermatitis, unspecified: Secondary | ICD-10-CM | POA: Diagnosis not present

## 2015-06-16 NOTE — Progress Notes (Signed)
   Subjective:    Patient ID: Craig Baird, male    DOB: 07/17/1933, 82 y.o.   MRN: [id number]  HPI patient continues to have a rash with itching. It is migratory usually affects trauma anterior posterior and somewhat now on neck and arms. Any breaking out is nonspecific. I thought before he may have some neurodermatitis and gave him some Atarax and some lotions but the problem persists. Today he is requesting blood work to rule out some infection in he has no other symptoms.    Review of Systems  Constitutional: Negative.   Respiratory: Negative.   Cardiovascular: Negative.   Skin: Positive for rash.       Patient Active Problem List   Diagnosis Date Noted  . Dermatitis 04/05/2015  . GAD (generalized anxiety disorder) 10/23/2014  . Insomnia 10/23/2014  . Arthritis 10/23/2014  . Hyperlipidemia 10/23/2014  . Allergic rhinitis 08/12/2012  . Anxiety state, unspecified 08/12/2012   Outpatient Encounter Prescriptions as of 06/16/2015  Medication Sig  . aspirin EC 81 MG tablet Take 1 tablet (81 mg total) by mouth daily. (Patient taking differently: Take 81 mg by mouth daily as needed. )  . escitalopram (LEXAPRO) 10 MG tablet TAKE 1 TABLET (10 MG TOTAL) BY MOUTH DAILY.  . hydrocortisone 1 % lotion Apply 1 application topically 2 (two) times daily.  . pravastatin (PRAVACHOL) 40 MG tablet Take 1 tablet (40 mg total) by mouth daily.  . cetirizine (ZYRTEC) 10 MG tablet TAKE ONE TABLET BY MOUTH ONE TIME DAILY (Patient not taking: Reported on 06/16/2015)  . [DISCONTINUED] hydrOXYzine (ATARAX/VISTARIL) 10 MG tablet Take 1 tablet (10 mg total) by mouth 3 (three) times daily as needed.   No facility-administered encounter medications on file as of 06/16/2015.    Objective:   Physical Exam  Constitutional: He appears well-developed and well-nourished.  Skin:  There are several macular lesions on the chest and back and neck. Some are scabbed.          Assessment & Plan:  1.  Dermatitis Nonspecific dermatitis at patient request will check CBC CMP looking at liver specifically hepatitis, without hepatitis panel and VDRL. If all blood work is normal plan is to go back and see dermatologist - CBC with Differential/Platelet - CMP14+EGFR - VDRL, Serum Stephen M Miller MD 

## 2015-06-21 LAB — CMP14+EGFR
A/G RATIO: 1.6 (ref 1.2–2.2)
ALK PHOS: 92 IU/L (ref 39–117)
ALT: 20 IU/L (ref 0–44)
AST: 17 IU/L (ref 0–40)
Albumin: 4.1 g/dL (ref 3.5–4.8)
BUN/Creatinine Ratio: 13 (ref 10–24)
BUN: 11 mg/dL (ref 8–27)
Bilirubin Total: 0.3 mg/dL (ref 0.0–1.2)
CO2: 26 mmol/L (ref 18–29)
Calcium: 9.1 mg/dL (ref 8.6–10.2)
Chloride: 99 mmol/L (ref 96–106)
Creatinine, Ser: 0.88 mg/dL (ref 0.76–1.27)
GFR calc Af Amer: 94 mL/min/{1.73_m2} (ref >59)
GFR, EST NON AFRICAN AMERICAN: 82 mL/min/{1.73_m2} (ref >59)
GLOBULIN, TOTAL: 2.6 g/dL (ref 1.5–4.5)
Glucose: 149 mg/dL — ABNORMAL HIGH (ref 65–99)
POTASSIUM: 4.5 mmol/L (ref 3.5–5.2)
SODIUM: 142 mmol/L (ref 134–144)
Total Protein: 6.7 g/dL (ref 6.0–8.5)

## 2015-06-21 LAB — CBC WITH DIFFERENTIAL/PLATELET
BASOS ABS: 0.1 10*3/uL (ref 0.0–0.2)
Basos: 1 %
EOS (ABSOLUTE): 0.4 10*3/uL (ref 0.0–0.4)
Eos: 6 %
HEMATOCRIT: 43.2 % (ref 37.5–51.0)
Hemoglobin: 14.6 g/dL (ref 12.6–17.7)
Immature Grans (Abs): 0 10*3/uL (ref 0.0–0.1)
Immature Granulocytes: 0 %
LYMPHS ABS: 2.5 10*3/uL (ref 0.7–3.1)
Lymphs: 39 %
MCH: 32.2 pg (ref 26.6–33.0)
MCHC: 33.8 g/dL (ref 31.5–35.7)
MCV: 95 fL (ref 79–97)
MONOS ABS: 0.6 10*3/uL (ref 0.1–0.9)
Monocytes: 9 %
Neutrophils Absolute: 2.9 10*3/uL (ref 1.4–7.0)
Neutrophils: 45 %
Platelets: 237 10*3/uL (ref 150–379)
RBC: 4.53 x10E6/uL (ref 4.14–5.80)
RDW: 13.9 % (ref 12.3–15.4)
WBC: 6.4 10*3/uL (ref 3.4–10.8)

## 2015-06-21 LAB — VDRL: NON-TREPONEMAL SCREENING VDRL: NORMAL

## 2015-07-28 DIAGNOSIS — L309 Dermatitis, unspecified: Secondary | ICD-10-CM | POA: Diagnosis not present

## 2015-07-28 DIAGNOSIS — L738 Other specified follicular disorders: Secondary | ICD-10-CM | POA: Diagnosis not present

## 2015-07-28 DIAGNOSIS — L989 Disorder of the skin and subcutaneous tissue, unspecified: Secondary | ICD-10-CM | POA: Diagnosis not present

## 2015-07-28 DIAGNOSIS — D485 Neoplasm of uncertain behavior of skin: Secondary | ICD-10-CM | POA: Diagnosis not present

## 2015-07-28 DIAGNOSIS — Z79899 Other long term (current) drug therapy: Secondary | ICD-10-CM | POA: Diagnosis not present

## 2015-07-28 DIAGNOSIS — Z5181 Encounter for therapeutic drug level monitoring: Secondary | ICD-10-CM | POA: Diagnosis not present

## 2015-08-12 ENCOUNTER — Other Ambulatory Visit: Payer: Self-pay | Admitting: Family Medicine

## 2015-08-27 ENCOUNTER — Ambulatory Visit (INDEPENDENT_AMBULATORY_CARE_PROVIDER_SITE_OTHER): Payer: Medicare Other | Admitting: Family Medicine

## 2015-08-27 ENCOUNTER — Encounter: Payer: Self-pay | Admitting: Family Medicine

## 2015-08-27 VITALS — BP 140/81 | HR 64 | Temp 97.9°F | Ht 64.0 in | Wt 146.6 lb

## 2015-08-27 DIAGNOSIS — M1712 Unilateral primary osteoarthritis, left knee: Secondary | ICD-10-CM | POA: Diagnosis not present

## 2015-08-27 MED ORDER — METHYLPREDNISOLONE ACETATE 80 MG/ML IJ SUSP
80.0000 mg | Freq: Once | INTRAMUSCULAR | Status: AC
  Administered 2015-08-27: 80 mg via INTRA_ARTICULAR

## 2015-08-27 MED ORDER — SODIUM HYALURONATE (VISCOSUP) 16.8 MG/2ML IX SOSY: 16.8000 mg | PREFILLED_SYRINGE | INTRA_ARTICULAR | Status: DC

## 2015-08-27 NOTE — Progress Notes (Signed)
BP 140/81 mmHg  Pulse 64  Temp(Src) 97.9 F (36.6 C) (Oral)  Ht 5\' 4"  (1.626 m)  Wt 146 lb 9.6 oz (66.497 kg)  BMI 25.15 kg/m2   Subjective:    Patient ID: Craig Baird, male    DOB: 1933-08-09, 82 y.o.   MRN: [id number]  HPI: Craig Baird is a 82 y.o. male presenting on 08/27/2015 for Knee Pain   HPI Left knee pain Patient has left knee pain and has been increasing over the past few weeks. He has had this chronically for at least a few years. 2 years ago he had an x-ray that did show that he had arthritis in that leg. He denies any numbness or weakness going down the leg. The pain is more described as a stiffness and is a lot worse when he is getting up initially but does have some when he is walking for prolonged periods or going up or down stairs. He has had steroid injections in the knee before and was wondering if there is anything that lasts longer or if he could try something different. We discussed the possibility of hyaluronic acid injections versus surgery and he would like to hold off on the surgery and see if we can get the other approved. Steroid injections usually about 3 months of relief  Relevant past medical, surgical, family and social history reviewed and updated as indicated. Interim medical history since our last visit reviewed. Allergies and medications reviewed and updated.  Review of Systems  Constitutional: Negative for fever.  HENT: Negative for ear discharge and ear pain.   Eyes: Negative for discharge and visual disturbance.  Respiratory: Negative for shortness of breath and wheezing.   Cardiovascular: Negative for chest pain and leg swelling.  Gastrointestinal: Negative for abdominal pain, diarrhea and constipation.  Genitourinary: Negative for difficulty urinating.  Musculoskeletal: Positive for arthralgias. Negative for back pain, joint swelling and gait problem.  Skin: Negative for rash.  Neurological: Negative for syncope, light-headedness  and headaches.  All other systems reviewed and are negative.   Per HPI unless specifically indicated above     Medication List       This list is accurate as of: 08/27/15 12:37 PM.  Always use your most recent med list.               aspirin EC 81 MG tablet  Take 1 tablet (81 mg total) by mouth daily.     cetirizine 10 MG tablet  Commonly known as:  ZYRTEC  TAKE ONE TABLET BY MOUTH ONE TIME DAILY     escitalopram 10 MG tablet  Commonly known as:  LEXAPRO  TAKE 1 TABLET (10 MG TOTAL) BY MOUTH DAILY.     hydrocortisone 1 % lotion  Apply 1 application topically 2 (two) times daily.     pravastatin 40 MG tablet  Commonly known as:  PRAVACHOL  Take 1 tablet (40 mg total) by mouth daily.     Sodium Hyaluronate (Viscosup) 16.8 MG/2ML Sosy  Commonly known as:  GELSYN-3  Inject 16.8 mg into the articular space once a week.           Objective:    BP 140/81 mmHg  Pulse 64  Temp(Src) 97.9 F (36.6 C) (Oral)  Ht 5\' 4"  (1.626 m)  Wt 146 lb 9.6 oz (66.497 kg)  BMI 25.15 kg/m2  Wt Readings from Last 3 Encounters:  08/27/15 146 lb 9.6 oz (66.497 kg)  06/16/15 146 lb 3.2 oz (  66.316 kg)  04/05/15 146 lb (66.225 kg)    Physical Exam  Constitutional: He is oriented to person, place, and time. He appears well-developed and well-nourished. No distress.  Eyes: Conjunctivae and EOM are normal. Pupils are equal, round, and reactive to light. Right eye exhibits no discharge. No scleral icterus.  Neck: Neck supple. No thyromegaly present.  Cardiovascular: Normal rate, regular rhythm, normal heart sounds and intact distal pulses.   No murmur heard. Pulmonary/Chest: Effort normal and breath sounds normal. No respiratory distress. He has no wheezes.  Musculoskeletal: Normal range of motion. He exhibits no edema.       Left knee: He exhibits normal range of motion, no swelling, no effusion, no erythema, normal alignment, no LCL laxity, normal patellar mobility, no bony tenderness,  normal meniscus and no MCL laxity. Tenderness found. Medial joint line tenderness noted. No MCL and no LCL tenderness noted.  Lymphadenopathy:    He has no cervical adenopathy.  Neurological: He is alert and oriented to person, place, and time. Coordination normal.  Skin: Skin is warm and dry. No rash noted. He is not diaphoretic.  Psychiatric: He has a normal mood and affect. His behavior is normal.  Nursing note and vitals reviewed.   Knee injection: Risk factors of bleeding and infection discussed with patient and patient is agreeable towards injection. Patient prepped with Betadine. Lateral approach towards injection used. Injected 80mg  of Depo-Medrol and 1 mL of 2% lidocaine. Patient tolerated procedure well and no side effects from noted. Minimal to no bleeding. Simple bandage applied after.     Assessment & Plan:   Problem List Items Addressed This Visit    None    Visit Diagnoses    Primary osteoarthritis of left knee    -  Primary    Relevant Medications    Sodium Hyaluronate, Viscosup, (GELSYN-3) 16.8 MG/2ML SOSY    methylPREDNISolone acetate (DEPO-MEDROL) injection 80 mg        Follow up plan: Return if symptoms worsen or fail to improve.  Counseling provided for all of the vaccine components No orders of the defined types were placed in this encounter.    Caryl Pina, MD [facility] 08/27/2015, 12:37 PM

## 2015-09-09 ENCOUNTER — Ambulatory Visit (INDEPENDENT_AMBULATORY_CARE_PROVIDER_SITE_OTHER): Payer: Medicare Other | Admitting: Family

## 2015-09-09 ENCOUNTER — Encounter: Payer: Self-pay | Admitting: Family

## 2015-09-09 ENCOUNTER — Ambulatory Visit (INDEPENDENT_AMBULATORY_CARE_PROVIDER_SITE_OTHER): Payer: Medicare Other

## 2015-09-09 VITALS — BP 125/67 | HR 79 | Temp 98.4°F | Ht 64.0 in | Wt 145.0 lb

## 2015-09-09 DIAGNOSIS — S300XXA Contusion of lower back and pelvis, initial encounter: Secondary | ICD-10-CM | POA: Diagnosis not present

## 2015-09-09 DIAGNOSIS — M25552 Pain in left hip: Secondary | ICD-10-CM

## 2015-09-09 DIAGNOSIS — S5002XA Contusion of left elbow, initial encounter: Secondary | ICD-10-CM

## 2015-09-09 NOTE — Progress Notes (Signed)
   Subjective:    Patient ID: Craig Baird, male    DOB: 1934-01-20, 82 y.o.   MRN: [id number]  Fall The accident occurred 12 to 24 hours ago. The fall occurred while standing. He fell from a height of 3 to 5 ft. He landed on hard floor. There was no blood loss. The point of impact was the buttocks, left elbow and head. The pain is present in the buttocks and left elbow. The pain is at a severity of 5/10. The pain is mild. The symptoms are aggravated by movement. Pertinent negatives include no bowel incontinence, fever, hearing loss, hematuria, loss of consciousness, numbness, tingling or visual change. He has tried NSAID for the symptoms. The treatment provided mild relief.      Review of Systems  Constitutional: Negative.  Negative for fever.  HENT: Negative.   Respiratory: Negative.   Cardiovascular: Negative.   Gastrointestinal: Negative.  Negative for bowel incontinence.  Endocrine: Negative.   Genitourinary: Negative.  Negative for hematuria.  Musculoskeletal: Positive for back pain and joint swelling. Negative for myalgias, neck pain and neck stiffness.  Neurological: Negative.  Negative for tingling, loss of consciousness and numbness.  Hematological: Negative.   Psychiatric/Behavioral: Negative.   All other systems reviewed and are negative.      Objective:   Physical Exam  Constitutional: He is oriented to person, place, and time. He appears well-developed and well-nourished. No distress.  HENT:  Head: Normocephalic.  Neck: Normal range of motion. Neck supple. No thyromegaly present.  Cardiovascular: Normal rate, regular rhythm, normal heart sounds and intact distal pulses.   No murmur heard. Pulmonary/Chest: Effort normal and breath sounds normal. No respiratory distress. He has no wheezes.  Abdominal: Soft. Bowel sounds are normal. He exhibits no distension. There is no tenderness.  Musculoskeletal: Normal range of motion. He exhibits edema (swelling in left  elbow). He exhibits no tenderness.  Neurological: He is alert and oriented to person, place, and time.  Skin: Skin is warm and dry. Ecchymosis (scattered on left hand) noted. No rash noted. No erythema.  Psychiatric: He has a normal mood and affect. His behavior is normal. Judgment and thought content normal.  Vitals reviewed.   BP 125/67 mmHg  Pulse 79  Temp(Src) 98.4 F (36.9 C) (Oral)  Ht 5\' 4"  (1.626 m)  Wt 145 lb (65.772 kg)  BMI 24.88 kg/m2  Left hip x-ray- No fracture Preliminary reading by Evelina Dun, FNP [facility]      Assessment & Plan:  1. Left hip pain - DG HIP UNILAT W OR W/O PELVIS 2-3 VIEWS LEFT; Future  2. Contusion, buttock, initial encounter  3. Contusion of left elbow, initial encounter  Ice as needed Motrin prn for pain Falls precaution discussed RTO prn  Evelina Dun, FNP

## 2015-09-09 NOTE — Patient Instructions (Signed)

## 2015-09-15 ENCOUNTER — Telehealth: Payer: Self-pay | Admitting: Family

## 2015-09-15 DIAGNOSIS — W1809XS Striking against other object with subsequent fall, sequela: Secondary | ICD-10-CM

## 2015-09-15 DIAGNOSIS — S0990XD Unspecified injury of head, subsequent encounter: Secondary | ICD-10-CM

## 2015-09-15 NOTE — Telephone Encounter (Signed)
Please review and advise.

## 2015-09-15 NOTE — Telephone Encounter (Signed)
Patient states that he is not having any trouble with his speech. He states that his eyes have felt like they have "sand" in them but this was going on before the fall and he is not having any trouble seeing. He would like to have a CT of his head because his head feels funny and he wants to make sure everything is okay. Please advise.

## 2015-09-15 NOTE — Telephone Encounter (Signed)
[email or web address]

## 2015-09-15 NOTE — Telephone Encounter (Signed)
Is patient having any trouble or changes in his speech, gait, or vision?

## 2015-09-15 NOTE — Telephone Encounter (Signed)
CT of head ordered

## 2015-09-20 ENCOUNTER — Ambulatory Visit (INDEPENDENT_AMBULATORY_CARE_PROVIDER_SITE_OTHER): Payer: Medicare Other | Admitting: Family

## 2015-09-20 ENCOUNTER — Encounter: Payer: Self-pay | Admitting: Family

## 2015-09-20 VITALS — BP 128/77 | HR 77 | Temp 97.8°F | Ht 64.0 in | Wt 147.0 lb

## 2015-09-20 DIAGNOSIS — W19XXXD Unspecified fall, subsequent encounter: Secondary | ICD-10-CM

## 2015-09-20 DIAGNOSIS — M25552 Pain in left hip: Secondary | ICD-10-CM

## 2015-09-20 DIAGNOSIS — F411 Generalized anxiety disorder: Secondary | ICD-10-CM

## 2015-09-20 MED ORDER — BUPROPION HCL ER (XL) 150 MG PO TB24: 150.0000 mg | ORAL_TABLET | Freq: Every day | ORAL | Status: DC

## 2015-09-20 MED ORDER — NAPROXEN 500 MG PO TABS: 500.0000 mg | ORAL_TABLET | Freq: Two times a day (BID) | ORAL | Status: DC

## 2015-09-20 NOTE — Patient Instructions (Signed)
Generalized Anxiety Disorder Generalized anxiety disorder (GAD) is a mental disorder. It interferes with life functions, including relationships, work, and school. GAD is different from normal anxiety, which everyone experiences at some point in their lives in response to specific life events and activities. Normal anxiety actually helps us prepare for and get through these life events and activities. Normal anxiety goes away after the event or activity is over.  GAD causes anxiety that is not necessarily related to specific events or activities. It also causes excess anxiety in proportion to specific events or activities. The anxiety associated with GAD is also difficult to control. GAD can vary from mild to severe. People with severe GAD can have intense waves of anxiety with physical symptoms (panic attacks).  SYMPTOMS The anxiety and worry associated with GAD are difficult to control. This anxiety and worry are related to many life events and activities and also occur more days than not for 6 months or longer. People with GAD also have three or more of the following symptoms (one or more in children):  Restlessness.   Fatigue.  Difficulty concentrating.   Irritability.  Muscle tension.  Difficulty sleeping or unsatisfying sleep. DIAGNOSIS GAD is diagnosed through an assessment by your health care provider. Your health care provider will ask you questions aboutyour mood,physical symptoms, and events in your life. Your health care provider may ask you about your medical history and use of alcohol or drugs, including prescription medicines. Your health care provider may also do a physical exam and blood tests. Certain medical conditions and the use of certain substances can cause symptoms similar to those associated with GAD. Your health care provider may refer you to a mental health specialist for further evaluation. TREATMENT The following therapies are usually used to treat GAD:    Medication. Antidepressant medication usually is prescribed for long-term daily control. Antianxiety medicines may be added in severe cases, especially when panic attacks occur.   Talk therapy (psychotherapy). Certain types of talk therapy can be helpful in treating GAD by providing support, education, and guidance. A form of talk therapy called cognitive behavioral therapy can teach you healthy ways to think about and react to daily life events and activities.  Stress managementtechniques. These include yoga, meditation, and exercise and can be very helpful when they are practiced regularly. A mental health specialist can help determine which treatment is best for you. Some people see improvement with one therapy. However, other people require a combination of therapies.   This information is not intended to replace advice given to you by your health care provider. Make sure you discuss any questions you have with your health care provider.   Document Released: 06/24/2012 Document Revised: 03/20/2014 Document Reviewed: 06/24/2012 Elsevier Interactive Patient Education 2016 [employer].  

## 2015-09-20 NOTE — Progress Notes (Signed)
   Subjective:    Patient ID: Shivam EARLAND SHALABY, male    DOB: April 25, 1933, 82 y.o.   MRN: [id number]  HPI PT presents to the office today with recurrent pain from a fall two weeks ago. PT was helping moving a table and fell backwards on his left hip. PT states the pain has slightly improved, but continues to have constant soreness pain 5-6 out 10. Pt states it is hard to put his shoes on. Pt has tried NSAIDs with no relief.   Pt states he continues to feel anxious and would like to increase his lexapro.  Review of Systems  Respiratory: Negative.   Cardiovascular: Negative.   Gastrointestinal: Negative.   Musculoskeletal: Positive for back pain and joint swelling.       Objective:   Physical Exam  Constitutional: He is oriented to person, place, and time. He appears well-developed and well-nourished. No distress.  HENT:  Head: Normocephalic.  Neck: Normal range of motion. Neck supple. No thyromegaly present.  Cardiovascular: Normal rate, regular rhythm, normal heart sounds and intact distal pulses.   No murmur heard. Pulmonary/Chest: Effort normal and breath sounds normal. No respiratory distress. He has no wheezes.  Abdominal: Soft. Bowel sounds are normal. He exhibits no distension. There is no tenderness.  Musculoskeletal: Normal range of motion. He exhibits no edema or tenderness.  Neurological: He is alert and oriented to person, place, and time. He has normal reflexes. No cranial nerve deficit.  Skin: Skin is warm and dry. No rash noted. No erythema.  Ecchymosis present on left buttocks and left thigh    Psychiatric: He has a normal mood and affect. His behavior is normal. Judgment and thought content normal.  Vitals reviewed.     BP 128/77 mmHg  Pulse 77  Temp(Src) 97.8 F (36.6 C) (Oral)  Ht 5\' 4"  (1.626 m)  Wt 147 lb (66.679 kg)  BMI 25.22 kg/m2     Assessment & Plan:  1. Left hip pain -Naprosyn 500mg  BID with food for next 5-7 days -Rest -Ice  2. Fall,  subsequent encounter  3. GAD (generalized anxiety disorder) -Added Wellbutrin 150 mg today -Continue lexapro 10 mg today -Stress management discussed -RTO prn  - buPROPion (WELLBUTRIN XL) 150 MG 24 hr tablet; Take 1 tablet (150 mg total) by mouth daily.  Dispense: 90 tablet; Refill: [place], FNP

## 2015-09-23 ENCOUNTER — Ambulatory Visit (HOSPITAL_COMMUNITY)
Admission: RE | Admit: 2015-09-23 | Discharge: 2015-09-23 | Disposition: A | Discharge status: Home / Self Care | Payer: Medicare Other | Source: Ambulatory Visit | Attending: Family | Admitting: Family

## 2015-09-23 DIAGNOSIS — J329 Chronic sinusitis, unspecified: Secondary | ICD-10-CM | POA: Diagnosis not present

## 2015-09-23 DIAGNOSIS — S0990XA Unspecified injury of head, initial encounter: Secondary | ICD-10-CM | POA: Diagnosis not present

## 2015-09-23 DIAGNOSIS — W1809XD Striking against other object with subsequent fall, subsequent encounter: Secondary | ICD-10-CM | POA: Insufficient documentation

## 2015-09-23 DIAGNOSIS — S0990XD Unspecified injury of head, subsequent encounter: Secondary | ICD-10-CM

## 2015-09-23 DIAGNOSIS — W1809XS Striking against other object with subsequent fall, sequela: Secondary | ICD-10-CM

## 2015-11-04 ENCOUNTER — Ambulatory Visit (INDEPENDENT_AMBULATORY_CARE_PROVIDER_SITE_OTHER): Payer: Medicare Other | Admitting: Family Medicine

## 2015-11-04 ENCOUNTER — Encounter: Payer: Self-pay | Admitting: Family Medicine

## 2015-11-04 VITALS — BP 126/70 | HR 82 | Temp 100.2°F | Ht 64.0 in | Wt 144.4 lb

## 2015-11-04 DIAGNOSIS — J0100 Acute maxillary sinusitis, unspecified: Secondary | ICD-10-CM

## 2015-11-04 MED ORDER — FLUTICASONE PROPIONATE 50 MCG/ACT NA SUSP: 2.0000 | Freq: Every day | NASAL | 5 refills | Status: DC

## 2015-11-04 MED ORDER — AMOXICILLIN-POT CLAVULANATE 875-125 MG PO TABS: 1.0000 | ORAL_TABLET | Freq: Two times a day (BID) | ORAL | 0 refills | Status: DC

## 2015-11-04 NOTE — Patient Instructions (Signed)
Great to see you again!  Take a probiotic or eat yogurt once daily while you are on the medicine, I have also sent flonase   Sinusitis, Adult Sinusitis is redness, soreness, and puffiness (inflammation) of the air pockets in the bones of your face (sinuses). The redness, soreness, and puffiness can cause air and mucus to get trapped in your sinuses. This can allow germs to grow and cause an infection.  HOME CARE   Drink enough fluids to keep your pee (urine) clear or pale yellow.  Use a humidifier in your home.  Run a hot shower to create steam in the bathroom. Sit in the bathroom with the door closed. Breathe in the steam 3-4 times a day.  Put a warm, moist washcloth on your face 3-4 times a day, or as told by your doctor.  Use salt water sprays (saline sprays) to wet the thick fluid in your nose. This can help the sinuses drain.  Only take medicine as told by your doctor. GET HELP RIGHT AWAY IF:   Your pain gets worse.  You have very bad headaches.  You are sick to your stomach (nauseous).  You throw up (vomit).  You are very sleepy (drowsy) all the time.  Your face is puffy (swollen).  Your vision changes.  You have a stiff neck.  You have trouble breathing. MAKE SURE YOU:   Understand these instructions.  Will watch your condition.  Will get help right away if you are not doing well or get worse.   This information is not intended to replace advice given to you by your health care provider. Make sure you discuss any questions you have with your health care provider.   Document Released: 08/16/2007 Document Revised: 03/20/2014 Document Reviewed: 10/03/2011 Elsevier Interactive Patient Education [employer].

## 2015-11-04 NOTE — Progress Notes (Signed)
   HPI  Patient presents today here with cough, congestion, and fatigue.  Patient describes nasal congestion, and bilateral frontal or maxillary sinus pressure and throbbing pain. He states this is consistent with previous sinus infections. He has mild cough and normal breathing.  He is tolerating food and fluids normally.  He has malaise and fatigue.  PMH: Smoking status noted ROS: Per HPI  Objective: BP 126/70   Pulse 82   Temp 100.2 F (37.9 C) (Oral)   Ht 5\' 4"  (1.626 m)   Wt 144 lb 6.4 oz (65.5 kg)   BMI 24.79 kg/m  Gen: NAD, alert, cooperative with exam HEENT: NCAT, nares with swollen turbinates bilaterally, TMs normal, no tenderness to palpation maxillary frontal sinuses, oropharynx clear CV: RRR, good S1/S2, no murmur Resp: CTABL, no wheezes, non-labored Ext: No edema, warm Neuro: Alert and oriented, No gross deficits  Assessment and plan:  # Acute sinusitis Patient does not have any tenderness on exam, however he complains of throbbing pain in the frontal and maxillary Sinus distribution. We discussed low threshold for return if symptoms worsen or are not improving as expected, also discussed supportive care including Flonase which was sent to his pharmacy.   Laroy Apple, MD [facility] 11/04/2015, 4:24 PM

## 2015-11-10 ENCOUNTER — Ambulatory Visit: Payer: Medicare Other | Admitting: Pharmacist

## 2015-11-23 ENCOUNTER — Encounter: Payer: Self-pay | Admitting: Pharmacist

## 2015-11-23 ENCOUNTER — Ambulatory Visit (INDEPENDENT_AMBULATORY_CARE_PROVIDER_SITE_OTHER): Payer: Medicare Other | Admitting: Pharmacist

## 2015-11-23 ENCOUNTER — Ambulatory Visit (INDEPENDENT_AMBULATORY_CARE_PROVIDER_SITE_OTHER): Payer: Medicare Other

## 2015-11-23 VITALS — BP 124/84 | HR 76 | Ht 65.25 in | Wt 145.0 lb

## 2015-11-23 DIAGNOSIS — Z0000 Encounter for general adult medical examination without abnormal findings: Secondary | ICD-10-CM

## 2015-11-23 DIAGNOSIS — R29890 Loss of height: Secondary | ICD-10-CM | POA: Diagnosis not present

## 2015-11-23 DIAGNOSIS — M8580 Other specified disorders of bone density and structure, unspecified site: Secondary | ICD-10-CM | POA: Insufficient documentation

## 2015-11-23 MED ORDER — CALCIUM CARBONATE-VITAMIN D 500-125 MG-UNIT PO TABS: 1.0000 | ORAL_TABLET | Freq: Every day | ORAL | 0 refills | Status: DC

## 2015-11-23 NOTE — Patient Instructions (Addendum)
[name] , Thank you for taking time to come for your Medicare Wellness Visit. I appreciate your ongoing commitment to your health goals. Please review the following plan we discussed and let me know if I can assist you in the future.   These are the goals we discussed:  Start calcium + D take 500 to 600mg  once a day  Continue to be active daily - walking.  Goal is to get 150 minutes of exercise / physical activity each week.   Increase non-starchy vegetables - carrots, green bean, squash, zucchini, tomatoes, onions, peppers, spinach and other green leafy vegetables, cabbage, lettuce, cucumbers, asparagus, okra (not fried), eggplant Limit sugar and processed foods (cakes, cookies, ice cream, crackers and chips) Increase fresh fruit but limit serving sizes 1/2 cup or about the size of tennis or baseball Limit red meat to no more than 1-2 times per week (serving size about the size of your palm) Choose whole grains / lean proteins - whole wheat bread, quinoa, whole grain rice (1/2 cup), fish, chicken, [place] Avoid sugar and calorie containing beverages - soda, sweet tea and juice.  Choose water or unsweetened tea instead.  This is a list of the screening recommended for you and due dates:  Health Maintenance  Topic Date Due  . Shingles Vaccine  02/20/1996 - verified cost - would be $240  . Flu Shot  10/12/2015  . Tetanus Vaccine  11/11/2023  . Pneumonia vaccines  Completed    Fall Prevention in the Home  Falls can cause injuries and can affect people from all age groups. There are many simple things that you can do to make your home safe and to help prevent falls. WHAT CAN I DO ON THE OUTSIDE OF MY HOME?  Regularly repair the edges of walkways and driveways and fix any cracks.  Remove high doorway thresholds.  Trim any shrubbery on the main path into your home.  Use bright outdoor lighting.  Clear walkways of debris and clutter, including tools and rocks.  Regularly check  that handrails are securely fastened and in good repair. Both sides of any steps should have handrails.  Install guardrails along the edges of any raised decks or porches.  Have leaves, snow, and ice cleared regularly.  Use sand or salt on walkways during winter months.  In the garage, clean up any spills right away, including grease or oil spills. WHAT CAN I DO IN THE BATHROOM?  Use night lights.  Install grab bars by the toilet and in the tub and shower. Do not use towel bars as grab bars.  Use non-skid mats or decals on the floor of the tub or shower.  If you need to sit down while you are in the shower, use a plastic, non-slip stool.Marland Kitchen  Keep the floor dry. Immediately clean up any water that spills on the floor.  Remove soap buildup in the tub or shower on a regular basis.  Attach bath mats securely with double-sided non-slip rug tape.  Remove throw rugs and other tripping hazards from the floor. WHAT CAN I DO IN THE BEDROOM?  Use night lights.  Make sure that a bedside light is easy to reach.  Do not use oversized bedding that drapes onto the floor.  Have a firm chair that has side arms to use for getting dressed.  Remove throw rugs and other tripping hazards from the floor. WHAT CAN I DO IN THE KITCHEN?   Clean up any spills right away.  Avoid  walking on wet floors.  Place frequently used items in easy-to-reach places.  If you need to reach for something above you, use a sturdy step stool that has a grab bar.  Keep electrical cables out of the way.  Do not use floor polish or wax that makes floors slippery. If you have to use wax, make sure that it is non-skid floor wax.  Remove throw rugs and other tripping hazards from the floor. WHAT CAN I DO IN THE STAIRWAYS?  Do not leave any items on the stairs.  Make sure that there are handrails on both sides of the stairs. Fix handrails that are broken or loose. Make sure that handrails are as long as the  stairways.  Check any carpeting to make sure that it is firmly attached to the stairs. Fix any carpet that is loose or worn.  Avoid having throw rugs at the top or bottom of stairways, or secure the rugs with carpet tape to prevent them from moving.  Make sure that you have a light switch at the top of the stairs and the bottom of the stairs. If you do not have them, have them installed. WHAT ARE SOME OTHER FALL PREVENTION TIPS?  Wear closed-toe shoes that fit well and support your feet. Wear shoes that have rubber soles or low heels.  When you use a stepladder, make sure that it is completely opened and that the sides are firmly locked. Have someone hold the ladder while you are using it. Do not climb a closed stepladder.  Add color or contrast paint or tape to grab bars and handrails in your home. Place contrasting color strips on the first and last steps.  Use mobility aids as needed, such as canes, walkers, scooters, and crutches.  Turn on lights if it is dark. Replace any light bulbs that burn out.  Set up furniture so that there are clear paths. Keep the furniture in the same spot.  Fix any uneven floor surfaces.  Choose a carpet design that does not hide the edge of steps of a stairway.  Be aware of any and all pets.  Review your medicines with your healthcare provider. Some medicines can cause dizziness or changes in blood pressure, which increase your risk of falling. Talk with your health care provider about other ways that you can decrease your risk of falls. This may include working with a physical therapist or trainer to improve your strength, balance, and endurance.   This information is not intended to replace advice given to you by your health care provider. Make sure you discuss any questions you have with your health care provider.   Document Released: 02/17/2002 Document Revised: 07/14/2014 Document Reviewed: 04/03/2014 Elsevier Interactive Patient Education [employer]

## 2015-11-24 NOTE — Progress Notes (Signed)
Patient ID: Craig Baird, male   DOB: November 18, 1933, 82 y.o.   MRN: [id number]     Subjective:   Craig Baird is a 82 y.o. male who presents for a subsequent Medicare Annual Wellness Visit.  Craig Baird is married. He and his wife live in [place]. He is retired from [employer].   He has no acute complaints today.   Current Medications (verified) Outpatient Encounter Prescriptions as of 11/23/2015  Medication Sig  . aspirin EC 81 MG tablet Take 1 tablet (81 mg total) by mouth daily.  Marland Kitchen buPROPion (WELLBUTRIN XL) 150 MG 24 hr tablet Take 1 tablet (150 mg total) by mouth daily.  . pravastatin (PRAVACHOL) 40 MG tablet Take 1 tablet (40 mg total) by mouth daily.  . Calcium Carbonate-Vitamin D (CALCIUM 500 + D) 500-125 MG-UNIT TABS Take 1 tablet by mouth daily.  . [DISCONTINUED] amoxicillin-clavulanate (AUGMENTIN) 875-125 MG tablet Take 1 tablet by mouth 2 (two) times daily. (Patient not taking: Reported on 11/23/2015)  . [DISCONTINUED] cetirizine (ZYRTEC) 10 MG tablet TAKE ONE TABLET BY MOUTH ONE TIME DAILY (Patient not taking: Reported on 11/23/2015)  . [DISCONTINUED] fluticasone (FLONASE) 50 MCG/ACT nasal spray Place 2 sprays into both nostrils daily. (Patient not taking: Reported on 11/23/2015)  . [DISCONTINUED] hydrocortisone 1 % lotion Apply 1 application topically 2 (two) times daily. (Patient not taking: Reported on 11/23/2015)  . [DISCONTINUED] naproxen (NAPROSYN) 500 MG tablet Take 1 tablet (500 mg total) by mouth 2 (two) times daily with a meal. (Patient not taking: Reported on 11/23/2015)  . [DISCONTINUED] Sodium Hyaluronate, Viscosup, (GELSYN-3) 16.8 MG/2ML SOSY Inject 16.8 mg into the articular space once a week. (Patient not taking: Reported on 11/23/2015)   No facility-administered encounter medications on file as of 11/23/2015.     Allergies (verified) Cialis [tadalafil] and Sulfa antibiotics   History: Past Medical History:  Diagnosis Date  . Allergy   . Anxiety   . Cataract     . Hernia    L inguinal hernia  . Hyperlipidemia    Past Surgical History:  Procedure Laterality Date  . THYROID CYST EXCISION  1973  . TONSILLECTOMY     Family History  Problem Relation Age of Onset  . Hypertension Mother   . Stroke Mother   . Parkinson's disease Mother   . Hip fracture Mother   . Hypertension Brother 13  . Heart disease Brother   . Birth defects Father     spine  . Early death Sister   . Early death Sister    Social History   Occupational History  . Not on file.   Social History Main Topics  . Smoking status: Never Smoker  . Smokeless tobacco: Never Used  . Alcohol use No  . Drug use: No  . Sexual activity: Yes    Do you feel safe at home?  Yes Are there smokers in your home (other than you)? No  Dietary issues and exercise activities discussed: Current Exercise Habits: Home exercise routine, Type of exercise: walking, Time (Minutes): 25, Frequency (Times/Week): 2, Weekly Exercise (Minutes/Week): 50, Intensity: Mild  Current Dietary habits:  Patient limits serving sizes.  Limits fried foods and fat intake due to hyperlipidemia.    Cardiac Risk Factors include: advanced age (>21men, >44 women);male gender;dyslipidemia  Objective:    Today's Vitals   11/23/15 1346  BP: 124/84  Pulse: 76  Weight: 145 lb (65.8 kg)  Height: 5' 5.25" (1.657 m)  PainSc: 0-No pain   Body mass  index is 23.94 kg/m.   DEXA results:   AP Spine 11/23/2015  = -0.2      10/14/2008  = -0.1  Neck of left femur  11/23/2015 = -1.5                               10/14/2008 = -1.5   Activities of Daily Living In your present state of health, do you have any difficulty performing the following activities: 11/23/2015  Hearing? N  Vision? N  Difficulty concentrating or making decisions? N  Walking or climbing stairs? N  Dressing or bathing? N  Doing errands, shopping? N  Preparing Food and eating ? N  Using the Toilet? N  In the past six months, have you  accidently leaked urine? N  Do you have problems with loss of bowel control? N  Managing your Medications? N  Managing your Finances? N  Housekeeping or managing your Housekeeping? N  Some recent data might be hidden     Depression Screen PHQ 2/9 Scores 11/23/2015 11/04/2015 09/20/2015 09/09/2015  PHQ - 2 Score 0 0 2 2  PHQ- 9 Score - - 7 5     Fall Risk Fall Risk  11/23/2015 11/04/2015 09/20/2015 09/09/2015 08/27/2015  Falls in the past year? Yes Yes Yes Yes No  Number falls in past yr: 1 1 1 1  -  Injury with Fall? No No Yes Yes -  Follow up Falls prevention discussed - - - -    Cognitive Function: MMSE - Mini Mental State Exam 11/23/2015 07/08/2014  Orientation to time 5 5  Orientation to Place 5 5  Registration 3 3  Attention/ Calculation 5 0  Recall 2 3  Language- name 2 objects 2 2  Language- repeat 1 0  Language- follow 3 step command 3 3  Language- read & follow direction 1 1  Write a sentence 1 1  Copy design 1 1  Total score 29 -    Immunizations and Health Maintenance Immunization History  Administered Date(s) Administered  . Influenza Split 11/21/2012  . Influenza, High Dose Seasonal PF 11/13/2013  . Pneumococcal Conjugate-13 07/08/2014  . Pneumococcal Polysaccharide-23 08/21/2007, 02/11/2011  . Pneumococcal-Unspecified 11/29/2014  . Tdap 11/23/2010   Health Maintenance Due  Topic Date Due  . ZOSTAVAX  02/20/1996  . INFLUENZA VACCINE  10/12/2015    Patient Care Team: Claretta Fraise, MD as PCP - General (Family Medicine) Lavonna Monarch, MD as Consulting Physician (Dermatology)  Indicate any recent Medical Services you may have received from other than [facility] providers in the past year (date may be approximate).    Assessment:    Annual Wellness Visit  Osteopenia - stable BMD but high fracture risk due to history of mother having hip fracture.    Screening Tests Health Maintenance  Topic Date Due  . ZOSTAVAX  02/20/1996  . INFLUENZA VACCINE  10/12/2015   . TETANUS/TDAP  11/11/2023  . PNA vac Low Risk Adult  Completed        Plan:   During the course of the visit Craig Baird was educated and counseled about the following appropriate screening and preventive services:   Vaccines to include Pneumoccal, Influenza,  Td, Zostavax - vaccines are UTD except Zostavax which was declined due to cost.  Appt made for influenza vaccine.  Colorectal cancer screening - refuses colonoscopy; FOBT 06/2014  Cardiovascular disease screening - taking pravastatin for hyperlipidemia  Diabetes screening - last FBG  was UTD but elevated   Recommend limit CHO in diet - decrease bread, potatoes and sugar containing foods.  Bone Denisty / Osteoporosis Screening - checked DEXA today.    Fall prevention discussed  Add Calcium + D take 1 tablet daily long with getting calcium from diet.  GOal 1200mg  per day from foods and supplements  Glaucoma screening / Eye Exam - UTD  Advanced Directives - information packet given   Physical Activity - continue to walk daily  appt made with PCP for physical  Orders Placed This Encounter Procedures . DG [facility] DEXA   Standing Status:   Future   Number of Occurrences:   1   Standing Expiration Date:   01/22/2017   Order Specific Question:   Reason for Exam (SYMPTOM  OR DIAGNOSIS REQUIRED)   Answer:   loss of height; male over 50 yo     Goals    None       Patient Instructions (the written plan) were given to the patient.   Cherre Robins, PharmD   11/24/2015

## 2015-11-29 DIAGNOSIS — Z23 Encounter for immunization: Secondary | ICD-10-CM | POA: Diagnosis not present

## 2015-11-30 ENCOUNTER — Ambulatory Visit: Payer: Self-pay

## 2015-12-16 ENCOUNTER — Ambulatory Visit (INDEPENDENT_AMBULATORY_CARE_PROVIDER_SITE_OTHER): Payer: Medicare Other | Admitting: Family Medicine

## 2015-12-16 ENCOUNTER — Encounter: Payer: Self-pay | Admitting: Family Medicine

## 2015-12-16 VITALS — BP 141/84 | HR 84 | Temp 97.0°F | Ht 65.25 in | Wt 141.2 lb

## 2015-12-16 DIAGNOSIS — E785 Hyperlipidemia, unspecified: Secondary | ICD-10-CM

## 2015-12-16 DIAGNOSIS — Z0000 Encounter for general adult medical examination without abnormal findings: Secondary | ICD-10-CM | POA: Diagnosis not present

## 2015-12-16 DIAGNOSIS — M8580 Other specified disorders of bone density and structure, unspecified site: Secondary | ICD-10-CM

## 2015-12-16 DIAGNOSIS — Z7689 Persons encountering health services in other specified circumstances: Secondary | ICD-10-CM | POA: Diagnosis not present

## 2015-12-16 DIAGNOSIS — F411 Generalized anxiety disorder: Secondary | ICD-10-CM

## 2015-12-16 NOTE — Patient Instructions (Signed)
Great to see you!  Come back in 4 to 6 months for routine follow up

## 2015-12-16 NOTE — Progress Notes (Signed)
   HPI  Patient presents today here for follow-up chronic medical conditions.  Patient states that he would like all of his lab work done including PSA today. Denies any prostate films and states that he does not want a prostate exam.  Hyperlipidemia Watches his diet, he is recreationally active but no formal exercise routine.  Anxiety Symptoms are reasonable, takes Wellbutrin daily  States that he gets nervous at the doctor's office that's why his blood pressure is elevated.  Request urine tests, however no urinary symptoms.  Vitamin D deficiency, osteopenia Inconsistent replacement of vitamin D   PMH: Smoking status noted ROS: Per HPI  Objective: BP (!) 141/84   Pulse 84   Temp 97 F (36.1 C) (Oral)   Ht 5' 5.25" (1.657 m)   Wt 141 lb 3.2 oz (64 kg)   BMI 23.32 kg/m  Gen: NAD, alert, cooperative with exam HEENT: NCAT, nares clear, TMs normal bilaterally with normal amount of cerumen, oropharynx clear  CV: RRR, good S1/S2, no murmur Resp: CTABL, no wheezes, non-labored Abd: SNTND, BS present, no guarding or organomegaly Ext: No edema, warm Neuro: Alert and oriented,EOMI, PERRLA    Assessment and plan:  # Preventative health care PSA check, lipid panel as well Patient is up-to-date on flu shot  # Osteopenia, vitamin D deficiency Rechecking vitamin D Continue daily replacement  # Generalized anxiety disorder Stable Continue Wellbutrin, consider SSRI if not well controlled in the future  # HLD Discussed therapeutic lifestyle changes, Not a great statin candidate given age labs    Orders Placed This Encounter  Procedures  . CMP14+EGFR  . CBC with Differential  . VITAMIN D 25 Hydroxy (Vit-D Deficiency, Fractures)  . PSA  . Lipid panel    Standing Status:   Future    Standing Expiration Date:   12/15/2016    Laroy Apple, MD [facility] 12/16/2015, 9:46 AM

## 2015-12-17 LAB — CMP14+EGFR
ALK PHOS: 92 IU/L (ref 39–117)
ALT: 14 IU/L (ref 0–44)
AST: 18 IU/L (ref 0–40)
Albumin/Globulin Ratio: 1.7 (ref 1.2–2.2)
Albumin: 4.2 g/dL (ref 3.5–4.8)
BILIRUBIN TOTAL: 0.3 mg/dL (ref 0.0–1.2)
BUN/Creatinine Ratio: 10 (ref 10–24)
BUN: 9 mg/dL (ref 8–27)
CHLORIDE: 101 mmol/L (ref 96–106)
CO2: 27 mmol/L (ref 18–29)
CREATININE: 0.9 mg/dL (ref 0.76–1.27)
Calcium: 9.4 mg/dL (ref 8.6–10.2)
GFR calc Af Amer: 94 mL/min/{1.73_m2} (ref >59)
GFR calc non Af Amer: 81 mL/min/{1.73_m2} (ref >59)
GLOBULIN, TOTAL: 2.5 g/dL (ref 1.5–4.5)
GLUCOSE: 89 mg/dL (ref 65–99)
Potassium: 4.5 mmol/L (ref 3.5–5.2)
SODIUM: 143 mmol/L (ref 134–144)
Total Protein: 6.7 g/dL (ref 6.0–8.5)

## 2015-12-17 LAB — CBC WITH DIFFERENTIAL/PLATELET
Basophils Absolute: 0.1 10*3/uL (ref 0.0–0.2)
Basos: 1 %
EOS (ABSOLUTE): 0.3 10*3/uL (ref 0.0–0.4)
EOS: 6 %
HEMOGLOBIN: 14.8 g/dL (ref 12.6–17.7)
Hematocrit: 44 % (ref 37.5–51.0)
IMMATURE GRANS (ABS): 0 10*3/uL (ref 0.0–0.1)
Immature Granulocytes: 0 %
LYMPHS ABS: 1.8 10*3/uL (ref 0.7–3.1)
Lymphs: 31 %
MCH: 32.5 pg (ref 26.6–33.0)
MCHC: 33.6 g/dL (ref 31.5–35.7)
MCV: 97 fL (ref 79–97)
MONOCYTES: 10 %
Monocytes Absolute: 0.6 10*3/uL (ref 0.1–0.9)
NEUTROS ABS: 3.1 10*3/uL (ref 1.4–7.0)
Neutrophils: 52 %
Platelets: 285 10*3/uL (ref 150–379)
RBC: 4.55 x10E6/uL (ref 4.14–5.80)
RDW: 13.5 % (ref 12.3–15.4)
WBC: 5.9 10*3/uL (ref 3.4–10.8)

## 2015-12-17 LAB — VITAMIN D 25 HYDROXY (VIT D DEFICIENCY, FRACTURES): Vit D, 25-Hydroxy: 37.9 ng/mL (ref 30.0–100.0)

## 2015-12-17 LAB — PSA: Prostate Specific Ag, Serum: 2.5 ng/mL (ref 0.0–4.0)

## 2015-12-19 LAB — LIPID PANEL
CHOL/HDL RATIO: 3.5 ratio (ref 0.0–5.0)
CHOLESTEROL TOTAL: 199 mg/dL (ref 100–199)
HDL: 57 mg/dL (ref >39)
LDL CALC: 120 mg/dL — AB (ref 0–99)
TRIGLYCERIDES: 110 mg/dL (ref 0–149)
VLDL Cholesterol Cal: 22 mg/dL (ref 5–40)

## 2015-12-19 LAB — SPECIMEN STATUS REPORT

## 2015-12-20 NOTE — Progress Notes (Signed)
Told pt about labs and printed out lab results for pt

## 2015-12-21 ENCOUNTER — Ambulatory Visit (INDEPENDENT_AMBULATORY_CARE_PROVIDER_SITE_OTHER): Payer: Medicare Other

## 2015-12-21 ENCOUNTER — Ambulatory Visit (INDEPENDENT_AMBULATORY_CARE_PROVIDER_SITE_OTHER): Payer: Medicare Other | Admitting: Family Medicine

## 2015-12-21 ENCOUNTER — Encounter: Payer: Self-pay | Admitting: Family Medicine

## 2015-12-21 VITALS — BP 128/71 | HR 108 | Temp 99.2°F | Ht 65.25 in | Wt 141.5 lb

## 2015-12-21 DIAGNOSIS — J0131 Acute recurrent sphenoidal sinusitis: Secondary | ICD-10-CM

## 2015-12-21 DIAGNOSIS — M25511 Pain in right shoulder: Secondary | ICD-10-CM | POA: Diagnosis not present

## 2015-12-21 DIAGNOSIS — J0141 Acute recurrent pansinusitis: Secondary | ICD-10-CM

## 2015-12-21 MED ORDER — MELOXICAM 15 MG PO TABS: 15.0000 mg | ORAL_TABLET | Freq: Every day | ORAL | 5 refills | Status: DC

## 2015-12-21 MED ORDER — AMOXICILLIN-POT CLAVULANATE 875-125 MG PO TABS: 1.0000 | ORAL_TABLET | Freq: Two times a day (BID) | ORAL | 0 refills | Status: DC

## 2015-12-21 MED ORDER — BETAMETHASONE SOD PHOS & ACET 6 (3-3) MG/ML IJ SUSP
6.0000 mg | Freq: Once | INTRAMUSCULAR | Status: AC
  Administered 2015-12-21: 6 mg via INTRAMUSCULAR

## 2015-12-21 NOTE — Progress Notes (Signed)
Subjective:  Patient ID: Craig Baird, male    DOB: 1933/06/10  Age: 82 y.o. MRN: [id number]  CC: Shoulder Pain (pt here today c/o right shoulder pain about 2 weeks ago but now he can't raise his arm in the past few days. Pt is also c/o sinus problems and would like to get an antibiotic)   HPI Craig Baird presents for Patient says he has sinusitis and needs amoxicillin. He's had a lot of congestion but no runny nose or drainage. He says his doesn't usually cause that though. He's got some frontal headache and he denies fever. Onset was 2-3 days ago. It seems to be getting worse. There is no cough associated. He had a CT with a recent fall. That result is added below. It did show evidence for sinusitis.  Patient tells me also that he has had increasing pain for 2 weeks in the right shoulder. The left is normal. He has no known injury. He does a lot of chores around the house and he uses his arms and shoulders he is right-handed. However, he is unaware of anything he might of done that could've caused the pain. He did have a fall between 3 and 4 months ago. He had x-rays at that time and the shoulder had not bothered him until a couple of weeks ago.   History Craig Baird has a past medical history of Allergy; Anxiety; Cataract; Hernia; and Hyperlipidemia.   He has a past surgical history that includes Tonsillectomy and Thyroid cyst excision (1973).   His family history includes Birth defects in his father; Early death in his sister and sister; Heart disease in his brother; Hip fracture in his mother; Hypertension in his mother; Hypertension (age of onset: 51) in his brother; Parkinson's disease in his mother; Stroke in his mother.He reports that he has never smoked. He has never used smokeless tobacco. He reports that he does not drink alcohol or use drugs.    ROS Review of Systems  Constitutional: Negative for chills, diaphoresis and fever.  HENT: Positive for sinus pressure. Negative  for postnasal drip, rhinorrhea and sore throat.   Respiratory: Negative for cough and shortness of breath.   Cardiovascular: Negative for chest pain.  Gastrointestinal: Negative for abdominal pain.  Musculoskeletal: Positive for arthralgias and joint swelling.  Skin: Negative for rash.  Neurological: Negative for weakness and headaches.    Objective:  BP 128/71   Pulse (!) 108   Temp 99.2 F (37.3 C) (Oral)   Ht 5' 5.25" (1.657 m)   Wt 141 lb 8 oz (64.2 kg)   BMI 23.37 kg/m   BP Readings from Last 3 Encounters:  12/21/15 128/71  12/16/15 (!) 141/84  11/23/15 124/84    Wt Readings from Last 3 Encounters:  12/21/15 141 lb 8 oz (64.2 kg)  12/16/15 141 lb 3.2 oz (64 kg)  11/23/15 145 lb (65.8 kg)     Physical Exam  Constitutional: He is oriented to person, place, and time. He appears well-developed and well-nourished. No distress.  HENT:  Head: Normocephalic and atraumatic.  Right Ear: External ear normal.  Left Ear: External ear normal.  Nose: Nose normal.  Mouth/Throat: Oropharynx is clear and moist.  Eyes: Conjunctivae and EOM are normal. Pupils are equal, round, and reactive to light.  Neck: Normal range of motion. Neck supple. No thyromegaly present.  Cardiovascular: Normal rate, regular rhythm and normal heart sounds.   No murmur heard. Pulmonary/Chest: Effort normal and breath sounds normal. No  respiratory distress. He has no wheezes. He has no rales.  Abdominal: Soft. Bowel sounds are normal. He exhibits no distension. There is no tenderness.  Musculoskeletal: He exhibits tenderness (marked tenderness over the tendons of the anterior right shoulder.).  Decreased ROM for external rotation, abduction, R Shoulder   Lymphadenopathy:    He has no cervical adenopathy.  Neurological: He is alert and oriented to person, place, and time. He has normal reflexes.  Skin: Skin is warm and dry.     Lab Results  Component Value Date   WBC 5.9 12/16/2015   HGB 14.8  11/10/2013   HCT 44.0 12/16/2015   PLT 285 12/16/2015   GLUCOSE 89 12/16/2015   CHOL 199 12/16/2015   TRIG 110 12/16/2015   HDL 57 12/16/2015   LDLCALC 120 (H) 12/16/2015   ALT 14 12/16/2015   AST 18 12/16/2015   NA 143 12/16/2015   K 4.5 12/16/2015   CL 101 12/16/2015   CREATININE 0.90 12/16/2015   BUN 9 12/16/2015   CO2 27 12/16/2015   TSH 2.370 11/10/2013   PSA 2.3 11/10/2013    Ct Head Wo Contrast  Result Date: 09/23/2015 CLINICAL DATA:  [place] 2 weeks ago with trauma to the head. EXAM: CT HEAD WITHOUT CONTRAST TECHNIQUE: Contiguous axial images were obtained from the base of the skull through the vertex without intravenous contrast. COMPARISON:  None. FINDINGS: No evidence of accelerated atrophy. No evidence of old or acute small or large vessel infarction. No mass lesion, hemorrhage, hydrocephalus or extra-axial collection. No skull fracture. Mucosal thickening affecting the right maxillary sinus and chronic mucoperiosteal thickening affecting the right division of the sphenoid sinus. IMPRESSION: Normal appearance of the brain for age.  No traumatic finding. Mucosal thickening affecting the right maxillary sinus. Mucoperiosteal thickening affecting the right division of the sphenoid sinus. Electronically Signed   By: Nelson Chimes M.D.   On: 09/23/2015 15:20    Assessment & Plan:   Craig Baird was seen today for shoulder pain.  Diagnoses and all orders for this visit:  Acute recurrent pansinusitis  Acute recurrent sphenoidal sinusitis -     amoxicillin-clavulanate (AUGMENTIN) 875-125 MG tablet; Take 1 tablet by mouth 2 (two) times daily.  Pain in joint of right shoulder -     DG Shoulder Right; Future -     betamethasone acetate-betamethasone sodium phosphate (CELESTONE) injection 6 mg; Inject 1 mL (6 mg total) into the muscle once.  Other orders -     meloxicam (MOBIC) 15 MG tablet; Take 1 tablet (15 mg total) by mouth daily. For joint and muscle pain   I have changed Mr.  Baird amoxicillin-clavulanate. I am also having him start on meloxicam. Additionally, I am having him maintain his aspirin EC, pravastatin, buPROPion, and Calcium Carbonate-Vitamin D. We will continue to administer betamethasone acetate-betamethasone sodium phosphate.  Meds ordered this encounter  Medications  . amoxicillin-clavulanate (AUGMENTIN) 875-125 MG tablet    Sig: Take 1 tablet by mouth 2 (two) times daily.    Dispense:  20 tablet    Refill:  0  . meloxicam (MOBIC) 15 MG tablet    Sig: Take 1 tablet (15 mg total) by mouth daily. For joint and muscle pain    Dispense:  30 tablet    Refill:  5  . betamethasone acetate-betamethasone sodium phosphate (CELESTONE) injection 6 mg     Follow-up: No Follow-up on file.  Claretta Fraise, M.D.

## 2016-02-05 ENCOUNTER — Ambulatory Visit (INDEPENDENT_AMBULATORY_CARE_PROVIDER_SITE_OTHER): Payer: Medicare Other | Admitting: Family Medicine

## 2016-02-05 ENCOUNTER — Encounter: Payer: Self-pay | Admitting: Family Medicine

## 2016-02-05 VITALS — BP 139/79 | HR 80 | Temp 97.3°F | Ht 65.0 in | Wt 139.0 lb

## 2016-02-05 DIAGNOSIS — L309 Dermatitis, unspecified: Secondary | ICD-10-CM | POA: Diagnosis not present

## 2016-02-05 MED ORDER — METHYLPREDNISOLONE ACETATE 80 MG/ML IJ SUSP
80.0000 mg | Freq: Once | INTRAMUSCULAR | Status: AC
  Administered 2016-02-05: 80 mg via INTRAMUSCULAR

## 2016-02-05 NOTE — Progress Notes (Signed)
BP 139/79   Pulse 80   Temp 97.3 F (36.3 C) (Oral)   Ht 5\' 5"  (1.651 m)   Wt 139 lb (63 kg)   BMI 23.13 kg/m    Subjective:    Patient ID: Craig Baird, male    DOB: [birth year], 82 y.o.   MRN: [id number]  HPI: Craig Baird is a 82 y.o. male presenting on 02/05/2016 for Rash (upper torso)   HPI Rash  Patient comes in for a rash that he has been having since the summer of last year. It gets better and worse but never completely clears. It has started to get worse over the past couple days again. He says the rash is not painful but is very pruritic. The rash covers most of his torso and back some on his neck and upper arms. He does not have any rash in his lower extremity. He denies any fevers or chills or drainage from any leukocytes. He says with the rash gets so bad that it keeps him up at night like it was last couple nights.  Relevant past medical, surgical, family and social history reviewed and updated as indicated. Interim medical history since our last visit reviewed. Allergies and medications reviewed and updated.  Review of Systems  Constitutional: Negative for chills and fever.  HENT: Negative for mouth sores.   Respiratory: Negative for shortness of breath and wheezing.   Cardiovascular: Negative for chest pain and leg swelling.  Musculoskeletal: Negative for back pain and gait problem.  Skin: Positive for rash. Negative for color change.  All other systems reviewed and are negative.   Per HPI unless specifically indicated above      Objective:    BP 139/79   Pulse 80   Temp 97.3 F (36.3 C) (Oral)   Ht 5\' 5"  (1.651 m)   Wt 139 lb (63 kg)   BMI 23.13 kg/m   Wt Readings from Last 3 Encounters:  02/05/16 139 lb (63 kg)  12/21/15 141 lb 8 oz (64.2 kg)  12/16/15 141 lb 3.2 oz (64 kg)    Physical Exam  Constitutional: He is oriented to person, place, and time. He appears well-developed and well-nourished. No distress.  HENT:  Mouth/Throat:  Oropharynx is clear and moist.  Eyes: Conjunctivae are normal. Right eye exhibits no discharge. Left eye exhibits no discharge. No scleral icterus.  Musculoskeletal: Normal range of motion. He exhibits no edema.  Neurological: He is alert and oriented to person, place, and time. Coordination normal.  Skin: Skin is warm and dry. Rash noted. Rash is papular and pustular (Covering upper torso in front and back and scattered lesions on neck and upper arms.). He is not diaphoretic.  Psychiatric: He has a normal mood and affect. His behavior is normal.  Nursing note and vitals reviewed.     Assessment & Plan:   Problem List Items Addressed This Visit      Musculoskeletal and Integument   Dermatitis - Primary    Creams from dermatologist did not help, will go back to dermatologist December 4 but just needs something now to help. Likely needs a biopsy      Relevant Medications   methylPREDNISolone acetate (DEPO-MEDROL) injection 80 mg (Start on 02/05/2016 10:30 AM)      Follow up plan: Return if symptoms worsen or fail to improve.  Counseling provided for all of the vaccine components No orders of the defined types were placed in this encounter.   Caryl Pina, MD  [place] 02/05/2016, 10:23 AM

## 2016-02-05 NOTE — Assessment & Plan Note (Addendum)
Creams from dermatologist did not help, will go back to dermatologist December 4 but just needs something now to help. Likely needs a biopsy

## 2016-02-14 DIAGNOSIS — D225 Melanocytic nevi of trunk: Secondary | ICD-10-CM | POA: Diagnosis not present

## 2016-02-14 DIAGNOSIS — Z5181 Encounter for therapeutic drug level monitoring: Secondary | ICD-10-CM | POA: Diagnosis not present

## 2016-02-14 DIAGNOSIS — L309 Dermatitis, unspecified: Secondary | ICD-10-CM | POA: Diagnosis not present

## 2016-02-14 DIAGNOSIS — L111 Transient acantholytic dermatosis [Grover]: Secondary | ICD-10-CM | POA: Diagnosis not present

## 2016-02-14 DIAGNOSIS — Z79899 Other long term (current) drug therapy: Secondary | ICD-10-CM | POA: Diagnosis not present

## 2016-02-14 DIAGNOSIS — D492 Neoplasm of unspecified behavior of bone, soft tissue, and skin: Secondary | ICD-10-CM | POA: Diagnosis not present

## 2016-04-03 DIAGNOSIS — H40033 Anatomical narrow angle, bilateral: Secondary | ICD-10-CM | POA: Diagnosis not present

## 2016-04-03 DIAGNOSIS — H04123 Dry eye syndrome of bilateral lacrimal glands: Secondary | ICD-10-CM | POA: Diagnosis not present

## 2016-04-17 DIAGNOSIS — L570 Actinic keratosis: Secondary | ICD-10-CM | POA: Diagnosis not present

## 2016-04-17 DIAGNOSIS — L309 Dermatitis, unspecified: Secondary | ICD-10-CM | POA: Diagnosis not present

## 2016-04-18 ENCOUNTER — Encounter: Payer: Self-pay | Admitting: Family Medicine

## 2016-04-18 ENCOUNTER — Ambulatory Visit (INDEPENDENT_AMBULATORY_CARE_PROVIDER_SITE_OTHER): Payer: Medicare Other | Admitting: Family Medicine

## 2016-04-18 VITALS — BP 133/79 | HR 85 | Temp 97.0°F | Ht 65.0 in | Wt 140.0 lb

## 2016-04-18 DIAGNOSIS — R110 Nausea: Secondary | ICD-10-CM

## 2016-04-18 MED ORDER — ONDANSETRON HCL 8 MG PO TABS: 8.0000 mg | ORAL_TABLET | Freq: Three times a day (TID) | ORAL | 0 refills | Status: DC | PRN

## 2016-04-18 NOTE — Progress Notes (Signed)
   Subjective:    Patient ID: Craig Baird, male    DOB: 1933/10/04, 83 y.o.   MRN: [id number]  HPI Patient here today for stomach issues. He has had some slight nausea and decrease in appetite since last Wednesday.  There is been no pain or vomiting or diarrhea. He has not lost any weight. There has been no previous abdominal surgeries.   Patient Active Problem List   Diagnosis Date Noted  . Preventative health care 12/16/2015  . Osteopenia 11/23/2015  . Dermatitis 04/05/2015  . GAD (generalized anxiety disorder) 10/23/2014  . Insomnia 10/23/2014  . Arthritis 10/23/2014  . Hyperlipidemia 10/23/2014  . Allergic rhinitis 08/12/2012   Outpatient Encounter Prescriptions as of 04/18/2016  Medication Sig  . aspirin EC 81 MG tablet Take 1 tablet (81 mg total) by mouth daily.  Marland Kitchen buPROPion (WELLBUTRIN XL) 150 MG 24 hr tablet Take 1 tablet (150 mg total) by mouth daily.  . Calcium Carbonate-Vitamin D (CALCIUM 500 + D) 500-125 MG-UNIT TABS Take 1 tablet by mouth daily.  . meloxicam (MOBIC) 15 MG tablet Take 1 tablet (15 mg total) by mouth daily. For joint and muscle pain  . pravastatin (PRAVACHOL) 40 MG tablet Take 1 tablet (40 mg total) by mouth daily.  . [DISCONTINUED] amoxicillin-clavulanate (AUGMENTIN) 875-125 MG tablet Take 1 tablet by mouth 2 (two) times daily.   No facility-administered encounter medications on file as of 04/18/2016.       Review of Systems  Constitutional: Positive for appetite change (decreased).  HENT: Negative.   Eyes: Negative.   Respiratory: Negative.   Cardiovascular: Negative.   Gastrointestinal: Positive for nausea (slight).  Endocrine: Negative.   Genitourinary: Negative.   Musculoskeletal: Negative.   Skin: Negative.   Allergic/Immunologic: Negative.   Neurological: Negative.   Hematological: Negative.   Psychiatric/Behavioral: Negative.        Objective:   Physical Exam  Constitutional: He appears well-developed and well-nourished.    Cardiovascular: Normal rate and regular rhythm.   Pulmonary/Chest: Effort normal and breath sounds normal.  Abdominal: Soft. There is no tenderness. There is no guarding.    BP 133/79 (BP Location: Left Arm)   Pulse 85   Temp 97 F (36.1 C) (Oral)   Ht 5\' 5"  (1.651 m)   Wt 140 lb (63.5 kg)   BMI 23.30 kg/m        Assessment & Plan:  1. Nausea Etiology unknown. Assume enteritis. Will treat symptomatically for now with anti-medic but if symptoms persist another week needs further evaluation such as referral to GI.  Wardell Honour MD

## 2016-04-24 ENCOUNTER — Telehealth: Payer: Self-pay | Admitting: Family Medicine

## 2016-04-24 ENCOUNTER — Other Ambulatory Visit: Payer: Self-pay | Admitting: *Deleted

## 2016-04-24 DIAGNOSIS — R110 Nausea: Secondary | ICD-10-CM

## 2016-04-24 DIAGNOSIS — R630 Anorexia: Secondary | ICD-10-CM

## 2016-04-24 NOTE — Telephone Encounter (Signed)
Referral placed.

## 2016-04-28 ENCOUNTER — Encounter (INDEPENDENT_AMBULATORY_CARE_PROVIDER_SITE_OTHER): Payer: Self-pay | Admitting: Internal Medicine

## 2016-04-28 ENCOUNTER — Encounter (INDEPENDENT_AMBULATORY_CARE_PROVIDER_SITE_OTHER): Payer: Self-pay

## 2016-05-03 ENCOUNTER — Other Ambulatory Visit: Payer: Self-pay | Admitting: Family Medicine

## 2016-05-03 DIAGNOSIS — J0131 Acute recurrent sphenoidal sinusitis: Secondary | ICD-10-CM

## 2016-05-03 NOTE — Telephone Encounter (Signed)
Line busy x 2 jkp 2/21 

## 2016-05-03 NOTE — Telephone Encounter (Signed)
Please contact the patient He would need to be seen

## 2016-05-05 ENCOUNTER — Ambulatory Visit (INDEPENDENT_AMBULATORY_CARE_PROVIDER_SITE_OTHER): Payer: Medicare Other | Admitting: Pediatrics

## 2016-05-05 ENCOUNTER — Encounter: Payer: Self-pay | Admitting: Pediatrics

## 2016-05-05 VITALS — BP 130/70 | HR 75 | Temp 97.4°F | Ht 65.0 in | Wt 142.0 lb

## 2016-05-05 DIAGNOSIS — J019 Acute sinusitis, unspecified: Secondary | ICD-10-CM | POA: Diagnosis not present

## 2016-05-05 DIAGNOSIS — J3089 Other allergic rhinitis: Secondary | ICD-10-CM | POA: Diagnosis not present

## 2016-05-05 MED ORDER — FLUTICASONE PROPIONATE 50 MCG/ACT NA SUSP: 2.0000 | Freq: Every day | NASAL | 6 refills | Status: DC

## 2016-05-05 MED ORDER — AMOXICILLIN 875 MG PO TABS
875.0000 mg | ORAL_TABLET | Freq: Two times a day (BID) | ORAL | 0 refills | Status: DC
Start: 2016-05-05 — End: 2016-06-15

## 2016-05-05 MED ORDER — CETIRIZINE HCL 10 MG PO TABS: 10.0000 mg | ORAL_TABLET | Freq: Every day | ORAL | 11 refills | Status: DC

## 2016-05-05 NOTE — Progress Notes (Signed)
  Subjective:   Patient ID: Craig Baird, male    DOB: 1933-03-17, 83 y.o.   MRN: [id number] CC: Headache; Nasal Congestion; and Sore Throat  HPI: Craig Baird is a 83 y.o. male presenting for Headache; Nasal Congestion; and Sore Throat  Started with a stomach virus about three weeks ago Had sore throat, URI symptoms Some cough, coughing up a little bit Decreased appetite Now with mostly sinus congestion and throat congestion Nose running a lot Feels nauseous much of time Has headaches off and on Feels like forehead is "blown up" Eyes feel stuffy  Relevant past medical, surgical, family and social history reviewed. Allergies and medications reviewed and updated. History  Smoking Status  . Never Smoker  Smokeless Tobacco  . Never Used   ROS: Per HPI   Objective:    BP 130/70   Pulse 75   Temp 97.4 F (36.3 C) (Oral)   Ht 5\' 5"  (1.651 m)   Wt 142 lb (64.4 kg)   BMI 23.63 kg/m   Wt Readings from Last 3 Encounters:  05/05/16 142 lb (64.4 kg)  04/18/16 140 lb (63.5 kg)  02/05/16 139 lb (63 kg)    Gen: NAD, alert, cooperative with exam, NCAT EYES: EOMI, no conjunctival injection, or no icterus ENT:  TMs injected b/l, nl LR, OP without erythema, no TTP over sinuses b/l LYMPH: no cervical LAD CV: NRRR, normal S1/S2, no murmur, distal pulses 2+ b/l Resp: CTABL, no wheezes, normal WOB Abd: +BS, soft, NTND. no guarding or organomegaly Ext: No edema, warm Neuro: Alert and oriented MSK: normal muscle bulk  Assessment & Plan:  [name] was seen today for headache, nasal congestion and sore throat.  Diagnoses and all orders for this visit:  Allergic rhinitis due to other allergic trigger, unspecified chronicity, unspecified seasonality Suspect symptoms due to allergies Discussed allergy treatment with below, sinus rinses -     fluticasone (FLONASE) 50 MCG/ACT nasal spray; Place 2 sprays into both nostrils daily. -     cetirizine (ZYRTEC) 10 MG tablet; Take 1  tablet (10 mg total) by mouth daily.  Acute sinusitis, recurrence not specified, unspecified location If feeling worse, develops worsening facial pain or fevers should start below, gave paper Rx If not improving RTC -     amoxicillin (AMOXIL) 875 MG tablet; Take 1 tablet (875 mg total) by mouth 2 (two) times daily.   Follow up plan: As needed Assunta Found, MD [place]

## 2016-05-05 NOTE — Patient Instructions (Signed)
NASAL STEROID USE INSTRUCTIONS  Step 1. Prepare the nose. Blow the nose before administering the drug.  Step 2. Prime and activate the delivery device as recommended by the manufacturer.  Step 3. Position the head by tilting the head forward.  Step 4. Insert the tip of the applicator gently, avoiding contact with the septum.  Step 5. Aim the applicator tip about 45 from the floor of the nose and direct it at the outer corner of the eye on the same side to avoid traumatizing or spraying the septum.  Step 6. Close the other nostril gently with a finger.   Step 7. Sniff or inhale gently while delivering the drug.   Netipot with distilled water 2-3 times a day to clear out sinuses Or Normal saline nasal spray Flonase steroid nasal spray Antihistamine daily such as cetirizine Tylenol as needed for body aches Lots of fluids

## 2016-05-13 ENCOUNTER — Encounter (HOSPITAL_COMMUNITY): Payer: Self-pay | Admitting: *Deleted

## 2016-05-13 ENCOUNTER — Emergency Department (HOSPITAL_COMMUNITY): Payer: Medicare Other

## 2016-05-13 ENCOUNTER — Emergency Department (HOSPITAL_COMMUNITY)
Admission: EM | Admit: 2016-05-13 | Discharge: 2016-05-13 | Disposition: A | Discharge status: Home / Self Care | Payer: Medicare Other | Attending: Emergency Medicine | Admitting: Emergency Medicine

## 2016-05-13 DIAGNOSIS — J0100 Acute maxillary sinusitis, unspecified: Secondary | ICD-10-CM | POA: Insufficient documentation

## 2016-05-13 DIAGNOSIS — Z7982 Long term (current) use of aspirin: Secondary | ICD-10-CM | POA: Diagnosis not present

## 2016-05-13 DIAGNOSIS — R05 Cough: Secondary | ICD-10-CM | POA: Diagnosis not present

## 2016-05-13 LAB — RAPID STREP SCREEN (MED CTR MEBANE ONLY): STREPTOCOCCUS, GROUP A SCREEN (DIRECT): NEGATIVE

## 2016-05-13 MED ORDER — TRAMADOL HCL 50 MG PO TABS: ORAL_TABLET | ORAL | 0 refills | Status: DC

## 2016-05-13 MED ORDER — AMOXICILLIN-POT CLAVULANATE 875-125 MG PO TABS: 1.0000 | ORAL_TABLET | Freq: Two times a day (BID) | ORAL | 0 refills | Status: DC

## 2016-05-13 MED ORDER — METHYLPREDNISOLONE SODIUM SUCC 125 MG IJ SOLR
125.0000 mg | Freq: Once | INTRAMUSCULAR | Status: AC
  Administered 2016-05-13: 125 mg via INTRAMUSCULAR
  Filled 2016-05-13: qty 2

## 2016-05-13 MED ORDER — PREDNISONE 10 MG PO TABS: 20.0000 mg | ORAL_TABLET | Freq: Every day | ORAL | 0 refills | Status: DC

## 2016-05-13 NOTE — ED Provider Notes (Signed)
[facility] DEPT Provider Note   CSN: [id number] Arrival date & time: 05/13/16  1038  By signing my name below, I, Reola Mosher, attest that this documentation has been prepared under the direction and in the presence of Milton Ferguson, MD. Electronically Signed: Reola Mosher, ED Scribe. 05/13/16. 1:39 PM.  History   Chief Complaint Chief Complaint  Patient presents with  . Cough   The history is provided by the patient. No language interpreter was used.  Cough  This is a recurrent problem. The current episode started more than 1 week ago. The problem occurs every few minutes. The problem has been gradually worsening. The cough is productive of sputum. There has been no fever. He has tried decongestants (and abx) for the symptoms. The treatment provided no relief. He is not a smoker. His past medical history does not include COPD.    HPI Comments: Craig Baird is a 83 y.o. male with a h/o allergies. who presents to the Emergency Department complaining of persistent, gradually worsening productive cough with yellow sputum onset one month ago. He notes associated sinus congestion, pain, and pressure. He describes the pain as pressure-like. Pt has been seen for this issue with his PCP who has placed him on Amoxicillin (most recent course ~3 days ago) and Flonase which he has taken without relief of his symptoms. He denies fever, or any other associated symptoms.   Past Medical History:  Diagnosis Date  . Allergy   . Anxiety   . Cataract   . Hernia    L inguinal hernia  . Hyperlipidemia    Patient Active Problem List   Diagnosis Date Noted  . Preventative health care 12/16/2015  . Osteopenia 11/23/2015  . Dermatitis 04/05/2015  . GAD (generalized anxiety disorder) 10/23/2014  . Insomnia 10/23/2014  . Arthritis 10/23/2014  . Hyperlipidemia 10/23/2014  . Allergic rhinitis 08/12/2012   Past Surgical History:  Procedure Laterality Date  . THYROID CYST EXCISION   1973  . TONSILLECTOMY      Home Medications    Prior to Admission medications   Medication Sig Start Date End Date Taking? Authorizing Provider  amoxicillin (AMOXIL) 875 MG tablet Take 1 tablet (875 mg total) by mouth 2 (two) times daily. 05/05/16   Eustaquio Maize, MD  aspirin EC 81 MG tablet Take 1 tablet (81 mg total) by mouth daily. 04/01/14   Claretta Fraise, MD  cetirizine (ZYRTEC) 10 MG tablet Take 1 tablet (10 mg total) by mouth daily. 05/05/16   Eustaquio Maize, MD  fluticasone (FLONASE) 50 MCG/ACT nasal spray Place 2 sprays into both nostrils daily. 05/05/16   Eustaquio Maize, MD   Family History Family History  Problem Relation Age of Onset  . Hypertension Mother   . Stroke Mother   . Parkinson's disease Mother   . Hip fracture Mother   . Hypertension Brother 66  . Heart disease Brother   . Birth defects Father     spine  . Early death Sister   . Early death Sister    Social History Social History  Substance Use Topics  . Smoking status: Never Smoker  . Smokeless tobacco: Never Used  . Alcohol use No   Allergies   Cialis [tadalafil]; Naproxen; and Sulfa antibiotics  Review of Systems Review of Systems  Constitutional: Negative for fever.  HENT: Positive for congestion, sinus pain and sinus pressure.   Respiratory: Positive for cough.   All other systems reviewed and are negative.  Physical Exam Updated Vital Signs BP 154/76 (BP Location: Right Arm)   Pulse 82   Temp 98.2 F (36.8 C) (Oral)   Resp 18   Ht 5\' 5"  (1.651 m)   Wt 142 lb (64.4 kg)   SpO2 98%   BMI 23.63 kg/m   Physical Exam  Constitutional: He is oriented to person, place, and time. He appears well-developed.  HENT:  Head: Normocephalic.  Some tenderness over the frontal and maxillary sinuses.  Eyes: Conjunctivae and EOM are normal. No scleral icterus.  Neck: Neck supple. No thyromegaly present.  Cardiovascular: Normal rate and regular rhythm.  Exam reveals no gallop and no friction rub.    No murmur heard. Pulmonary/Chest: No stridor. He has no wheezes. He has no rales. He exhibits no tenderness.  Abdominal: He exhibits no distension. There is no tenderness. There is no rebound.  Musculoskeletal: Normal range of motion. He exhibits no edema.  Lymphadenopathy:    He has no cervical adenopathy.  Neurological: He is oriented to person, place, and time. He exhibits normal muscle tone. Coordination normal.  Skin: No rash noted. No erythema.  Psychiatric: He has a normal mood and affect. His behavior is normal.  Nursing note and vitals reviewed.  ED Treatments / Results  DIAGNOSTIC STUDIES: Oxygen Saturation is 98% on RA, normal by my interpretation.   COORDINATION OF CARE: 1:38 PM-Discussed next steps with pt. Pt verbalized understanding and is agreeable with the plan.   Labs (all labs ordered are listed, but only abnormal results are displayed) Labs Reviewed  RAPID STREP SCREEN (NOT AT [facility])  CULTURE, GROUP A STREP [facility])   EKG  EKG Interpretation None      Radiology Dg Chest 2 View  Result Date: 05/13/2016 CLINICAL DATA:  Productive cough. EXAM: CHEST  2 VIEW COMPARISON:  None. FINDINGS: The heart size and mediastinal contours are within normal limits. Both lungs are clear. No pneumothorax or pleural effusion is noted. The visualized skeletal structures are unremarkable. IMPRESSION: No active cardiopulmonary disease. Electronically Signed   By: Marijo Conception, M.D.   On: 05/13/2016 11:21   Procedures Procedures   Medications Ordered in ED Medications - No data to display  Initial Impression / Assessment and Plan / ED Course  I have reviewed the triage vital signs and the nursing notes.  Pertinent labs & imaging results that were available during my care of the patient were reviewed by me and considered in my medical decision making (see chart for details).     Patient with sinusitis. Patient will get a shot of Solu-Medrol. He will be put on prednisone  and Augmentin. He will stop the amoxicillin. Patient also given Ultram for pain if Tylenol does not help. He will follow-up with his doctor next week  Final Clinical Impressions(s) / ED Diagnoses   Final diagnoses:  None   New Prescriptions New Prescriptions   No medications on file   The chart was scribed for me under my direct supervision.  I personally performed the history, physical, and medical decision making and all procedures in the evaluation of this patient.Milton Ferguson, MD 05/13/16 [phone]

## 2016-05-13 NOTE — ED Triage Notes (Addendum)
Pt comes in with cough and congestion for 1 week. Pt went to PCP and was place don amoxicillin. He has been on that for 3-4 day. Denies any n/v/d. Pt has sore throat as well.

## 2016-05-13 NOTE — Discharge Instructions (Signed)
Stop taking the amoxicillin. Follow-up with your doctor in a week if not improving

## 2016-05-15 ENCOUNTER — Ambulatory Visit (INDEPENDENT_AMBULATORY_CARE_PROVIDER_SITE_OTHER): Payer: Self-pay | Admitting: Internal Medicine

## 2016-05-16 LAB — CULTURE, GROUP A STREP (THRC)

## 2016-06-15 ENCOUNTER — Ambulatory Visit (INDEPENDENT_AMBULATORY_CARE_PROVIDER_SITE_OTHER): Payer: Medicare Other

## 2016-06-15 ENCOUNTER — Ambulatory Visit (INDEPENDENT_AMBULATORY_CARE_PROVIDER_SITE_OTHER): Payer: Medicare Other | Admitting: Nurse Practitioner

## 2016-06-15 ENCOUNTER — Encounter: Payer: Self-pay | Admitting: Nurse Practitioner

## 2016-06-15 VITALS — BP 136/73 | HR 85 | Temp 97.3°F | Ht 65.0 in | Wt 137.0 lb

## 2016-06-15 DIAGNOSIS — M25562 Pain in left knee: Secondary | ICD-10-CM | POA: Diagnosis not present

## 2016-06-15 NOTE — Progress Notes (Signed)
   Subjective:    Patient ID: Craig Baird, male    DOB: [birth year], 83 y.o.   MRN: [id number]  HPI Patient in c/o left knee pain - was seen 1 year ago with same complaint- xray in 2014  Showed mild degenerative changes. Pain is increasing when he is active- would like to see ortho.    Review of Systems  Constitutional: Negative.   Respiratory: Negative.   Cardiovascular: Negative.   Genitourinary: Negative.   Musculoskeletal: Positive for arthralgias.  Neurological: Negative.   Psychiatric/Behavioral: Negative.   All other systems reviewed and are negative.      Objective:   Physical Exam  Constitutional: He is oriented to person, place, and time. He appears well-developed and well-nourished. No distress.  Cardiovascular: Normal rate and regular rhythm.   Pulmonary/Chest: Effort normal and breath sounds normal.  Musculoskeletal:  FROM of left knee with crepitus on flexion and extension Mild joint effusion All ligaments intact  Neurological: He is alert and oriented to person, place, and time.  Skin: Skin is warm.  Psychiatric: He has a normal mood and affect. His behavior is normal. Judgment and thought content normal.   BP 136/73   Pulse 85   Temp 97.3 F (36.3 C) (Oral)   Ht 5\' 5"  (1.651 m)   Wt 137 lb (62.1 kg)   BMI 22.80 kg/m       Assessment & Plan:  1. Acute pain of left knee Tylenol arthritis strength Referral to ortho - DG Knee 1-2 Views Left; Future  Mary-Margaret Hassell Done, FNP

## 2016-06-15 NOTE — Patient Instructions (Signed)

## 2016-06-29 DIAGNOSIS — G8929 Other chronic pain: Secondary | ICD-10-CM | POA: Diagnosis not present

## 2016-06-29 DIAGNOSIS — M1712 Unilateral primary osteoarthritis, left knee: Secondary | ICD-10-CM | POA: Diagnosis not present

## 2016-06-29 DIAGNOSIS — M25562 Pain in left knee: Secondary | ICD-10-CM | POA: Diagnosis not present

## 2016-08-14 ENCOUNTER — Encounter: Payer: Self-pay | Admitting: Nurse Practitioner

## 2016-08-14 ENCOUNTER — Ambulatory Visit (INDEPENDENT_AMBULATORY_CARE_PROVIDER_SITE_OTHER): Payer: Medicare Other | Admitting: Nurse Practitioner

## 2016-08-14 VITALS — BP 129/78 | HR 83 | Temp 97.5°F | Ht 65.0 in | Wt 132.0 lb

## 2016-08-14 DIAGNOSIS — J3089 Other allergic rhinitis: Secondary | ICD-10-CM

## 2016-08-14 MED ORDER — METHYLPREDNISOLONE ACETATE 80 MG/ML IJ SUSP
80.0000 mg | Freq: Once | INTRAMUSCULAR | Status: AC
  Administered 2016-08-14: 80 mg via INTRAMUSCULAR

## 2016-08-14 MED ORDER — FLUTICASONE PROPIONATE 50 MCG/ACT NA SUSP: 2.0000 | Freq: Every day | NASAL | 6 refills | Status: DC

## 2016-08-14 NOTE — Patient Instructions (Signed)
Allergic Rhinitis Allergic rhinitis is when the mucous membranes in the nose respond to allergens. Allergens are particles in the air that cause your body to have an allergic reaction. This causes you to release allergic antibodies. Through a chain of events, these eventually cause you to release histamine into the blood stream. Although meant to protect the body, it is this release of histamine that causes your discomfort, such as frequent sneezing, congestion, and an itchy, runny nose. What are the causes? Seasonal allergic rhinitis (hay fever) is caused by pollen allergens that may come from grasses, trees, and weeds. Year-round allergic rhinitis (perennial allergic rhinitis) is caused by allergens such as house dust mites, pet dander, and mold spores. What are the signs or symptoms?  Nasal stuffiness (congestion).  Itchy, runny nose with sneezing and tearing of the eyes. How is this diagnosed? Your health care provider can help you determine the allergen or allergens that trigger your symptoms. If you and your health care provider are unable to determine the allergen, skin or blood testing may be used. Your health care provider will diagnose your condition after taking your health history and performing a physical exam. Your health care provider may assess you for other related conditions, such as asthma, pink eye, or an ear infection. How is this treated? Allergic rhinitis does not have a cure, but it can be controlled by:  Medicines that block allergy symptoms. These may include allergy shots, nasal sprays, and oral antihistamines.  Avoiding the allergen. Hay fever may often be treated with antihistamines in pill or nasal spray forms. Antihistamines block the effects of histamine. There are over-the-counter medicines that may help with nasal congestion and swelling around the eyes. Check with your health care provider before taking or giving this medicine. If avoiding the allergen or the  medicine prescribed do not work, there are many new medicines your health care provider can prescribe. Stronger medicine may be used if initial measures are ineffective. Desensitizing injections can be used if medicine and avoidance does not work. Desensitization is when a patient is given ongoing shots until the body becomes less sensitive to the allergen. Make sure you follow up with your health care provider if problems continue. Follow these instructions at home: It is not possible to completely avoid allergens, but you can reduce your symptoms by taking steps to limit your exposure to them. It helps to know exactly what you are allergic to so that you can avoid your specific triggers. Contact a health care provider if:  You have a fever.  You develop a cough that does not stop easily (persistent).  You have shortness of breath.  You start wheezing.  Symptoms interfere with normal daily activities. This information is not intended to replace advice given to you by your health care provider. Make sure you discuss any questions you have with your health care provider. Document Released: 11/22/2000 Document Revised: 10/29/2015 Document Reviewed: 11/04/2012 Elsevier Interactive Patient Education  2017 [employer] Inc[employer]  

## 2016-08-14 NOTE — Progress Notes (Signed)
   Subjective:    Patient ID: Craig Baird, male    DOB: December 16, 1933, 83 y.o.   MRN: [id number]  HPI Patient come sin today c/o sinusitis- has had for over 2 weeks- took some augmnetin for 7 days which helped some- the head congestion is what is bothering him- he is taking zyrtec daily.    Review of Systems  Constitutional: Negative for appetite change, chills and fever.  HENT: Positive for congestion and sinus pressure. Negative for ear pain, rhinorrhea, sore throat and trouble swallowing.   Respiratory: Negative for cough.   Cardiovascular: Negative.   Genitourinary: Negative.   Neurological: Negative.   Psychiatric/Behavioral: Negative.   All other systems reviewed and are negative.      Objective:   Physical Exam  Constitutional: He is oriented to person, place, and time. He appears well-developed and well-nourished. No distress.  HENT:  Right Ear: Hearing, tympanic membrane, external ear and ear canal normal.  Left Ear: Hearing, tympanic membrane, external ear and ear canal normal.  Nose: Mucosal edema and rhinorrhea present. Right sinus exhibits no maxillary sinus tenderness and no frontal sinus tenderness. Left sinus exhibits no maxillary sinus tenderness and no frontal sinus tenderness.  Mouth/Throat: Uvula is midline, oropharynx is clear and moist and mucous membranes are normal.  Neck: Normal range of motion.  Cardiovascular: Normal rate and regular rhythm.   Pulmonary/Chest: Effort normal and breath sounds normal.  Neurological: He is alert and oriented to person, place, and time.  Skin: Skin is warm.  Psychiatric: He has a normal mood and affect. His behavior is normal. Judgment and thought content normal.    BP 129/78   Pulse 83   Temp 97.5 F (36.4 C) (Oral)   Ht 5\' 5"  (1.651 m)   Wt 132 lb (59.9 kg)   BMI 21.97 kg/m        Assessment & Plan:   1. Allergic rhinitis due to other allergic trigger, unspecified seasonality    Meds ordered this  encounter  Medications  . fluticasone (FLONASE) 50 MCG/ACT nasal spray    Sig: Place 2 sprays into both nostrils daily.    Dispense:  16 g    Refill:  6    Order Specific Question:   Supervising Provider    Answer:   VINCENT, CAROL L [[id number]]  . methylPREDNISolone acetate (DEPO-MEDROL) injection 80 mg   Force fluids Avoid allergens RTO prn  Mary-Margaret Hassell Done, FNP

## 2016-08-22 ENCOUNTER — Encounter: Payer: Self-pay | Admitting: Physician Assistant

## 2016-08-22 ENCOUNTER — Telehealth: Payer: Self-pay | Admitting: Nurse Practitioner

## 2016-08-22 ENCOUNTER — Ambulatory Visit (INDEPENDENT_AMBULATORY_CARE_PROVIDER_SITE_OTHER): Payer: Medicare Other | Admitting: Physician Assistant

## 2016-08-22 VITALS — BP 149/82 | HR 86 | Temp 97.1°F | Wt 130.4 lb

## 2016-08-22 DIAGNOSIS — R634 Abnormal weight loss: Secondary | ICD-10-CM

## 2016-08-22 DIAGNOSIS — J0110 Acute frontal sinusitis, unspecified: Secondary | ICD-10-CM | POA: Diagnosis not present

## 2016-08-22 DIAGNOSIS — R110 Nausea: Secondary | ICD-10-CM

## 2016-08-22 MED ORDER — DOXYCYCLINE HYCLATE 100 MG PO TABS: 100.0000 mg | ORAL_TABLET | Freq: Two times a day (BID) | ORAL | 0 refills | Status: DC

## 2016-08-22 NOTE — Progress Notes (Signed)
Subjective:     Patient ID: Craig Baird, male   DOB: 30-Sep-1933, 83 y.o.   MRN: [id number]  HPI Sinus congestion x 3 mos Prev trial of Pred, Flonase, and Amox have not helped sx Also with hx of fatigue, chronic nausea, and weight loss  Review of Systems  Constitutional: Positive for activity change, appetite change, fatigue and unexpected weight change. Negative for chills and fever.  HENT: Positive for congestion, postnasal drip and sinus pressure. Negative for ear discharge, ear pain, facial swelling, nosebleeds and rhinorrhea.   Respiratory: Negative.   Cardiovascular: Negative.   Gastrointestinal: Negative for abdominal distention, abdominal pain, blood in stool, constipation, diarrhea, nausea and vomiting.       Objective:   Physical Exam  Constitutional: He appears well-developed and well-nourished.  HENT:  Right Ear: External ear normal.  Left Ear: External ear normal.  Mouth/Throat: No oropharyngeal exudate.  No sinus TTP  Neck: Neck supple.  Cardiovascular: Normal rate, regular rhythm and normal heart sounds.   No murmur heard. Pulmonary/Chest: Effort normal and breath sounds normal.  Abdominal: Soft. Bowel sounds are normal. He exhibits no distension and no mass. There is no tenderness.  Lymphadenopathy:    He has no cervical adenopathy.  Nursing note and vitals reviewed. BMP pending     Assessment:     1. Acute frontal sinusitis, recurrence not specified   2. Nausea   3. Weight loss        Plan:     Regarding sinus will change to Doxycyline 100mg  bid x 10 days Continue other meds Due to chronic nausea and weight loss BMP and CBC done Referral to GI for eval as well Will inform of lab results F/U pending labs and referral

## 2016-08-22 NOTE — Telephone Encounter (Signed)
Informed wife pt NTBS here again first Pt will be coming by here while he is in town today, we have appts open

## 2016-08-22 NOTE — Telephone Encounter (Signed)
What type of referral do you need? [facility] to check in to sinus problems and stomach issues  Have you been seen at our office for this problem? yes (If no, schedule them an appointment.  They will need to be seen before a referral can be done.)  Is there a particular doctor or location that you prefer? [facility]  Patient notified that referrals can take up to a week or longer to process. If they haven't heard anything within a week they should call back and speak with the referral department.

## 2016-08-22 NOTE — Patient Instructions (Signed)

## 2016-08-23 LAB — CBC WITH DIFFERENTIAL/PLATELET
BASOS ABS: 0.1 10*3/uL (ref 0.0–0.2)
Basos: 1 %
EOS (ABSOLUTE): 0.3 10*3/uL (ref 0.0–0.4)
Eos: 4 %
Hematocrit: 45.9 % (ref 37.5–51.0)
Hemoglobin: 15.1 g/dL (ref 13.0–17.7)
IMMATURE GRANULOCYTES: 0 %
Immature Grans (Abs): 0 10*3/uL (ref 0.0–0.1)
Lymphocytes Absolute: 2.3 10*3/uL (ref 0.7–3.1)
Lymphs: 27 %
MCH: 32.1 pg (ref 26.6–33.0)
MCHC: 32.9 g/dL (ref 31.5–35.7)
MCV: 98 fL — ABNORMAL HIGH (ref 79–97)
MONOS ABS: 0.6 10*3/uL (ref 0.1–0.9)
Monocytes: 7 %
NEUTROS PCT: 61 %
Neutrophils Absolute: 5.3 10*3/uL (ref 1.4–7.0)
Platelets: 331 10*3/uL (ref 150–379)
RBC: 4.71 x10E6/uL (ref 4.14–5.80)
RDW: 13.8 % (ref 12.3–15.4)
WBC: 8.6 10*3/uL (ref 3.4–10.8)

## 2016-08-23 LAB — BMP8+EGFR
BUN/Creatinine Ratio: 15 (ref 10–24)
BUN: 13 mg/dL (ref 8–27)
CALCIUM: 9.6 mg/dL (ref 8.6–10.2)
CO2: 31 mmol/L — AB (ref 20–29)
Chloride: 101 mmol/L (ref 96–106)
Creatinine, Ser: 0.85 mg/dL (ref 0.76–1.27)
GFR calc Af Amer: 95 mL/min/{1.73_m2} (ref >59)
GFR calc non Af Amer: 82 mL/min/{1.73_m2} (ref >59)
GLUCOSE: 103 mg/dL — AB (ref 65–99)
POTASSIUM: 5 mmol/L (ref 3.5–5.2)
SODIUM: 144 mmol/L (ref 134–144)

## 2016-08-30 ENCOUNTER — Encounter: Payer: Self-pay | Admitting: Internal Medicine

## 2016-08-31 ENCOUNTER — Encounter: Payer: Self-pay | Admitting: Nurse Practitioner

## 2016-08-31 ENCOUNTER — Ambulatory Visit (INDEPENDENT_AMBULATORY_CARE_PROVIDER_SITE_OTHER): Payer: Medicare Other | Admitting: Nurse Practitioner

## 2016-08-31 ENCOUNTER — Other Ambulatory Visit: Payer: Self-pay

## 2016-08-31 ENCOUNTER — Telehealth: Payer: Self-pay

## 2016-08-31 DIAGNOSIS — R634 Abnormal weight loss: Secondary | ICD-10-CM

## 2016-08-31 DIAGNOSIS — R110 Nausea: Secondary | ICD-10-CM | POA: Diagnosis not present

## 2016-08-31 DIAGNOSIS — R112 Nausea with vomiting, unspecified: Secondary | ICD-10-CM

## 2016-08-31 MED ORDER — NA SULFATE-K SULFATE-MG SULF 17.5-3.13-1.6 GM/177ML PO SOLN: 1.0000 | ORAL | 0 refills | Status: DC

## 2016-08-31 MED ORDER — ONDANSETRON HCL 4 MG PO TABS: 4.0000 mg | ORAL_TABLET | Freq: Four times a day (QID) | ORAL | 1 refills | Status: DC | PRN

## 2016-08-31 NOTE — Patient Instructions (Signed)
1. We will schedule your procedures for you. 2. I have sent an Zofran 4 mg tablets to your pharmacy for nausea. You can take this every 6 hours as needed for nausea. 3. Return for follow-up in 3 months. 4. Call us if you have any worsening or severe symptoms before then.

## 2016-08-31 NOTE — Progress Notes (Signed)
Primary Care Physician:  Claretta Fraise, MD Primary Gastroenterologist:  Dr. Gala Romney  Chief Complaint  Patient presents with  . Weight Loss    lost 12 lbs in 3 months  . Nausea    hasn't vomited lately    HPI:   Craig Baird is a 83 y.o. male who presents On referral from primary care for weight loss and nausea. The patient was last seen by primary care on 08/22/2016 at which point he notes appetite change, fatigue, unexpected weight loss as well as chronic nausea. BMP and CBC were ordered and referral to GI completed. BMP essentially normal. CBC also essentially normal. No history of colonoscopy or endoscopy in our system.  Today he states he's not doing well. Started with sinusitis in April which he has been fighting chronically for the past couple months. Most recently 3 weeks and treatment finally "pretty well cleared up" his symptoms. Since his first round of antibiotics has had nausea, no vomiting, "a sick feeling." Denies abdominal pain, hematochezia, melena, fatigue/worsening energy; appetite pretty good. Has lost about 14 lbs in the past few months. Denies previous colonoscopy or EGD. Denies fever, chills, acute changes in bowel habits. Denies chest pain, dyspnea, dizziness, lightheadedness, syncope, near syncope. Denies any other upper or lower GI symptoms.  Past Medical History:  Diagnosis Date  . Allergy   . Anxiety   . Cataract   . Hernia    L inguinal hernia  . Hyperlipidemia   . Sinusitis     Past Surgical History:  Procedure Laterality Date  . THYROID CYST EXCISION  1973  . TONSILLECTOMY      Current Outpatient Prescriptions  Medication Sig Dispense Refill  . aspirin EC 81 MG tablet Take 1 tablet (81 mg total) by mouth daily. (Patient taking differently: Take 81 mg by mouth daily. Doesn't take everyday) 100 tablet 11  . fluticasone (FLONASE) 50 MCG/ACT nasal spray Place 2 sprays into both nostrils daily. (Patient taking differently: Place 2 sprays into both  nostrils as needed. ) 16 g 6   No current facility-administered medications for this visit.     Allergies as of 08/31/2016 - Review Complete 08/31/2016  Allergen Reaction Noted  . Cialis [tadalafil]  08/12/2012  . Naproxen Other (See Comments) 12/16/2015  . Sulfa antibiotics Rash 08/12/2012    Family History  Problem Relation Age of Onset  . Hypertension Mother   . Stroke Mother   . Parkinson's disease Mother   . Hip fracture Mother   . Hypertension Brother 10  . Heart disease Brother   . Birth defects Father        spine  . Early death Sister   . Early death Sister   . Colon cancer Neg Hx   . Gastric cancer Neg Hx   . Esophageal cancer Neg Hx     Social History   Social History  . Marital status: Married    Spouse name: N/A  . Number of children: N/A  . Years of education: N/A   Occupational History  . Not on file.   Social History Main Topics  . Smoking status: Never Smoker  . Smokeless tobacco: Never Used  . Alcohol use No  . Drug use: No  . Sexual activity: Yes   Other Topics Concern  . Not on file   Social History Narrative  . No narrative on file    Review of Systems: General: Negative for anorexia, fever, chills. ENT: Negative for hoarseness, difficulty swallowing.  CV: Negative for chest pain, angina, palpitations, peripheral edema.  Respiratory: Negative for dyspnea at rest, cough, sputum, wheezing.  GI: See history of present illness. MS: Negative for joint pain, low back pain.  Derm: Negative for rash or itching.  Endo: Negative for unusual weight change.  Heme: Negative for bruising or bleeding. Allergy: Negative for rash or hives.    Physical Exam: BP (!) 145/76   Pulse 76   Temp 97.1 F (36.2 C) (Oral)   Ht 5\' 6"  (1.676 m)   Wt 128 lb (58.1 kg)   BMI 20.66 kg/m  General:   Alert and oriented. Pleasant and cooperative. Well-nourished and well-developed.  Head:  Normocephalic and atraumatic. Eyes:  Without icterus, sclera clear  and conjunctiva pink.  Ears:  Normal auditory acuity. Cardiovascular:  S1, S2 present without murmurs appreciated. Extremities without clubbing or edema. Respiratory:  Clear to auscultation bilaterally. No wheezes, rales, or rhonchi. No distress.  Gastrointestinal:  +BS, soft, non-tender and non-distended. No HSM noted. No guarding or rebound. No masses appreciated.  Rectal:  Deferred  Musculoskalatal:  Symmetrical without gross deformities. Neurologic:  Alert and oriented x4;  grossly normal neurologically. Psych:  Alert and cooperative. Normal mood and affect. Heme/Lymph/Immune: No excessive bruising noted.    08/31/2016 11:45 AM   Disclaimer: This note was dictated with voice recognition software. Similar sounding words can inadvertently be transcribed and may not be corrected upon review.

## 2016-08-31 NOTE — Progress Notes (Signed)
CC'D TO PCP °

## 2016-08-31 NOTE — Assessment & Plan Note (Signed)
Noted unintentional weight loss of 12-14 pounds in the past 3 months. His appetite was poor previously when on antibiotics but is much improved. He is continued to lose weight including 2 pounds in the previous week. He is never had a colonoscopy or upper endoscopy. Given weight loss, persistent nausea, no previous endoscopic evaluation we will plan for colonoscopy and upper endoscopy to further evaluate his chronic/persistent nausea and weight loss. I will also provide Zofran in the meantime for symptomatic treatment. Return for follow-up in 3 months, or based on postprocedure recommendations.  Proceed with TCS + EGD with Dr. Gala Romney in near future: the risks, benefits, and alternatives have been discussed with the patient in detail. The patient states understanding and desires to proceed.  The patient is not on any anticoagulants, anxiolytics, chronic pain medications, or antidepressants. Denies alcohol and drug use. Conscious sedation should be adequate for his procedure.

## 2016-08-31 NOTE — Assessment & Plan Note (Signed)
Persistent nausea over the past few months. He has been battling sinusitis and is had a couple rounds of antibiotics as well as steroid injection. His nausea could be sequela of antibiotic use, although he does not have diarrhea. He does have significant weight loss despite normalized appetite. We will further evaluate with colonoscopy and upper endoscopy as noted above given that he has not had any of these and he is currently 83 years old. I will send an Zofran to help with symptomatic management. Return for follow-up in 3 months.

## 2016-08-31 NOTE — Telephone Encounter (Signed)
pts wife called with name of nerve medication. Pt is taking bupropion hcl XL 150mg  daily. I have added this to his medication list in epic.

## 2016-08-31 NOTE — Telephone Encounter (Signed)
Noted, thank you

## 2016-09-04 ENCOUNTER — Telehealth: Payer: Self-pay | Admitting: Internal Medicine

## 2016-09-04 NOTE — Telephone Encounter (Signed)
Talked with wife and told there that RMR was out of town and the schedule full, but she could call daily to see if we had anything to come open.

## 2016-09-04 NOTE — Telephone Encounter (Signed)
Patient wife called and asked if the procedure he is scheduled for can be "speeded up" because he really needs to have it done.

## 2016-09-05 NOTE — Telephone Encounter (Signed)
Pt has been move up to September 15, 2016 @ 1:00 pm. He is aware and new instructions are going out in the mail today.

## 2016-09-11 NOTE — Telephone Encounter (Signed)
Called pt d/t a cancellation 09/15/16 at 7:30am to see if would like to have his procedure done earlier that morning. When told his arrival time would be 6:30am, he said that was too early. Informed pt we would leave his time as it was. Informed Endo Scheduler.

## 2016-09-15 ENCOUNTER — Encounter (HOSPITAL_COMMUNITY): Payer: Self-pay | Admitting: *Deleted

## 2016-09-15 ENCOUNTER — Ambulatory Visit (HOSPITAL_COMMUNITY)
Admission: RE | Admit: 2016-09-15 | Discharge: 2016-09-15 | Disposition: A | Discharge status: Home / Self Care | Payer: Medicare Other | Source: Ambulatory Visit | Attending: Internal Medicine | Admitting: Internal Medicine

## 2016-09-15 ENCOUNTER — Encounter (HOSPITAL_COMMUNITY)
Admission: RE | Disposition: A | Discharge status: Home / Self Care | Payer: Self-pay | Source: Ambulatory Visit | Attending: Internal Medicine

## 2016-09-15 DIAGNOSIS — F419 Anxiety disorder, unspecified: Secondary | ICD-10-CM | POA: Insufficient documentation

## 2016-09-15 DIAGNOSIS — R110 Nausea: Secondary | ICD-10-CM | POA: Diagnosis not present

## 2016-09-15 DIAGNOSIS — Z79899 Other long term (current) drug therapy: Secondary | ICD-10-CM | POA: Diagnosis not present

## 2016-09-15 DIAGNOSIS — R112 Nausea with vomiting, unspecified: Secondary | ICD-10-CM

## 2016-09-15 DIAGNOSIS — Z7982 Long term (current) use of aspirin: Secondary | ICD-10-CM | POA: Insufficient documentation

## 2016-09-15 DIAGNOSIS — K5730 Diverticulosis of large intestine without perforation or abscess without bleeding: Secondary | ICD-10-CM | POA: Insufficient documentation

## 2016-09-15 DIAGNOSIS — R634 Abnormal weight loss: Secondary | ICD-10-CM | POA: Insufficient documentation

## 2016-09-15 DIAGNOSIS — Z1211 Encounter for screening for malignant neoplasm of colon: Secondary | ICD-10-CM | POA: Insufficient documentation

## 2016-09-15 DIAGNOSIS — D122 Benign neoplasm of ascending colon: Secondary | ICD-10-CM | POA: Insufficient documentation

## 2016-09-15 DIAGNOSIS — E785 Hyperlipidemia, unspecified: Secondary | ICD-10-CM | POA: Insufficient documentation

## 2016-09-15 DIAGNOSIS — Z1212 Encounter for screening for malignant neoplasm of rectum: Secondary | ICD-10-CM

## 2016-09-15 DIAGNOSIS — K449 Diaphragmatic hernia without obstruction or gangrene: Secondary | ICD-10-CM | POA: Insufficient documentation

## 2016-09-15 DIAGNOSIS — D128 Benign neoplasm of rectum: Secondary | ICD-10-CM | POA: Insufficient documentation

## 2016-09-15 HISTORY — PX: COLONOSCOPY: SHX5424

## 2016-09-15 HISTORY — PX: ESOPHAGOGASTRODUODENOSCOPY: SHX5428

## 2016-09-15 SURGERY — COLONOSCOPY
Anesthesia: Moderate Sedation

## 2016-09-15 MED ORDER — LIDOCAINE VISCOUS 2 % MT SOLN
OROMUCOSAL | Status: DC | PRN
  Administered 2016-09-15: 1 via OROMUCOSAL

## 2016-09-15 MED ORDER — LIDOCAINE VISCOUS 2 % MT SOLN
OROMUCOSAL | Status: AC
  Filled 2016-09-15: qty 15

## 2016-09-15 MED ORDER — MIDAZOLAM HCL 5 MG/5ML IJ SOLN
INTRAMUSCULAR | Status: AC
  Filled 2016-09-15: qty 10

## 2016-09-15 MED ORDER — ONDANSETRON HCL 4 MG/2ML IJ SOLN
INTRAMUSCULAR | Status: AC
  Filled 2016-09-15: qty 2

## 2016-09-15 MED ORDER — SODIUM CHLORIDE 0.9 % IV SOLN
INTRAVENOUS | Status: DC
  Administered 2016-09-15: 13:00:00 via INTRAVENOUS

## 2016-09-15 MED ORDER — ONDANSETRON HCL 4 MG/2ML IJ SOLN
INTRAMUSCULAR | Status: DC | PRN
  Administered 2016-09-15: 4 mg via INTRAVENOUS

## 2016-09-15 MED ORDER — MEPERIDINE HCL 100 MG/ML IJ SOLN
INTRAMUSCULAR | Status: DC | PRN
  Administered 2016-09-15 (×2): 25 mg via INTRAVENOUS

## 2016-09-15 MED ORDER — MEPERIDINE HCL 100 MG/ML IJ SOLN
INTRAMUSCULAR | Status: AC
  Filled 2016-09-15: qty 2

## 2016-09-15 MED ORDER — MIDAZOLAM HCL 5 MG/5ML IJ SOLN
INTRAMUSCULAR | Status: DC | PRN
  Administered 2016-09-15: 1 mg via INTRAVENOUS
  Administered 2016-09-15: 2 mg via INTRAVENOUS
  Administered 2016-09-15: 1 mg via INTRAVENOUS

## 2016-09-15 NOTE — Op Note (Signed)
[facility] Patient Name: Craig Baird Procedure Date: 09/15/2016 12:57 PM MRN: [id number] Date of Birth: 30-Jan-1934 Attending MD: Norvel Richards , MD CSN: [id number] Age: 83 Admit Type: Outpatient Procedure:                Upper GI endoscopy Indications:              Nausea, Weight loss Providers:                Norvel Richards, MD, Janeece Riggers, RN, Aram Candela Referring MD:              Medicines:                Midazolam 3 mg IV, Meperidine 50 mg IV, Ondansetron                            4 mg IV Complications:            No immediate complications. Estimated Blood Loss:     Estimated blood loss was minimal. Estimated blood                            loss: none. Procedure:                Pre-Anesthesia Assessment:                           - Prior to the procedure, a History and Physical                            was performed, and patient medications and                            allergies were reviewed. The patient's tolerance of                            previous anesthesia was also reviewed. The risks                            and benefits of the procedure and the sedation                            options and risks were discussed with the patient.                            All questions were answered, and informed consent                            was obtained. Prior Anticoagulants: The patient has                            taken no previous anticoagulant or antiplatelet  agents. ASA Grade Assessment: II - A patient with                            mild systemic disease. After reviewing the risks                            and benefits, the patient was deemed in                            satisfactory condition to undergo the procedure.                           After obtaining informed consent, the endoscope was                            passed under direct vision. Throughout the              procedure, the patient's blood pressure, pulse, and                            oxygen saturations were monitored continuously. The                            [id number] ([id number]) scope was introduced through the                            mouth, and advanced to the second part of duodenum.                            The upper GI endoscopy was accomplished without                            difficulty. The patient tolerated the procedure                            well. Scope In: 1:32:25 PM Scope Out: 1:35:49 PM Total Procedure Duration: 0 hours 3 minutes 24 seconds  Findings:      The examined esophagus was normal.      A small hiatal hernia was present.      The exam was otherwise without abnormality.      The duodenal bulb and second portion of the duodenum were normal. Impression:               - Normal esophagus.                           - Small hiatal hernia.                           - The examination was otherwise normal.                           - Normal duodenal bulb and second portion of the  duodenum.                           - No specimens collected. Moderate Sedation:      Moderate (conscious) sedation was administered by the endoscopy nurse       and supervised by the endoscopist. The following parameters were       monitored: oxygen saturation, heart rate, blood pressure, respiratory       rate, EKG, adequacy of pulmonary ventilation, and response to care.       Total physician intraservice time was 13 minutes. Recommendation:           - Patient has a contact number available for                            emergencies. The signs and symptoms of potential                            delayed complications were discussed with the                            patient. Return to normal activities tomorrow.                            Written discharge instructions were provided to the                            patient.                            - Resume previous diet.                           - Continue present medications.                           - Await pathology results.                           - No repeat upper endoscopy.                           - Return to GI office in 2 months. See colonoscopy                            report. Procedure Code(s):        --- Professional ---                           [phone], Esophagogastroduodenoscopy, flexible,                            transoral; diagnostic, including collection of                            specimen(s) by brushing or washing, when performed                            (  separate procedure)                           99152, Moderate sedation services provided by the                            same physician or other qualified health care                            professional performing the diagnostic or                            therapeutic service that the sedation supports,                            requiring the presence of an independent trained                            observer to assist in the monitoring of the                            patient's level of consciousness and physiological                            status; initial 15 minutes of intraservice time,                            patient age 62 years or older Diagnosis Code(s):        --- Professional ---                           K44.9, Diaphragmatic hernia without obstruction or                            gangrene                           R11.0, Nausea                           R63.4, Abnormal weight loss CPT copyright 2016 [employer]. All rights reserved. The codes documented in this report are preliminary and upon coder review may  be revised to meet current compliance requirements. Cristopher Estimable. Nathian Stencil, MD Norvel Richards, MD 09/15/2016 1:55:05 PM This report has been signed electronically. Number of Addenda: 0

## 2016-09-15 NOTE — H&P (View-Only) (Signed)
Primary Care Physician:  Claretta Fraise, MD Primary Gastroenterologist:  Dr. Gala Romney  Chief Complaint  Patient presents with  . Weight Loss    lost 12 lbs in 3 months  . Nausea    hasn't vomited lately    HPI:   Craig Baird is a 83 y.o. male who presents On referral from primary care for weight loss and nausea. The patient was last seen by primary care on 08/22/2016 at which point he notes appetite change, fatigue, unexpected weight loss as well as chronic nausea. BMP and CBC were ordered and referral to GI completed. BMP essentially normal. CBC also essentially normal. No history of colonoscopy or endoscopy in our system.  Today he states he's not doing well. Started with sinusitis in April which he has been fighting chronically for the past couple months. Most recently 3 weeks and treatment finally "pretty well cleared up" his symptoms. Since his first round of antibiotics has had nausea, no vomiting, "a sick feeling." Denies abdominal pain, hematochezia, melena, fatigue/worsening energy; appetite pretty good. Has lost about 14 lbs in the past few months. Denies previous colonoscopy or EGD. Denies fever, chills, acute changes in bowel habits. Denies chest pain, dyspnea, dizziness, lightheadedness, syncope, near syncope. Denies any other upper or lower GI symptoms.  Past Medical History:  Diagnosis Date  . Allergy   . Anxiety   . Cataract   . Hernia    L inguinal hernia  . Hyperlipidemia   . Sinusitis     Past Surgical History:  Procedure Laterality Date  . THYROID CYST EXCISION  1973  . TONSILLECTOMY      Current Outpatient Prescriptions  Medication Sig Dispense Refill  . aspirin EC 81 MG tablet Take 1 tablet (81 mg total) by mouth daily. (Patient taking differently: Take 81 mg by mouth daily. Doesn't take everyday) 100 tablet 11  . fluticasone (FLONASE) 50 MCG/ACT nasal spray Place 2 sprays into both nostrils daily. (Patient taking differently: Place 2 sprays into both  nostrils as needed. ) 16 g 6   No current facility-administered medications for this visit.     Allergies as of 08/31/2016 - Review Complete 08/31/2016  Allergen Reaction Noted  . Cialis [tadalafil]  08/12/2012  . Naproxen Other (See Comments) 12/16/2015  . Sulfa antibiotics Rash 08/12/2012    Family History  Problem Relation Age of Onset  . Hypertension Mother   . Stroke Mother   . Parkinson's disease Mother   . Hip fracture Mother   . Hypertension Brother 44  . Heart disease Brother   . Birth defects Father        spine  . Early death Sister   . Early death Sister   . Colon cancer Neg Hx   . Gastric cancer Neg Hx   . Esophageal cancer Neg Hx     Social History   Social History  . Marital status: Married    Spouse name: N/A  . Number of children: N/A  . Years of education: N/A   Occupational History  . Not on file.   Social History Main Topics  . Smoking status: Never Smoker  . Smokeless tobacco: Never Used  . Alcohol use No  . Drug use: No  . Sexual activity: Yes   Other Topics Concern  . Not on file   Social History Narrative  . No narrative on file    Review of Systems: General: Negative for anorexia, fever, chills. ENT: Negative for hoarseness, difficulty swallowing.  CV: Negative for chest pain, angina, palpitations, peripheral edema.  Respiratory: Negative for dyspnea at rest, cough, sputum, wheezing.  GI: See history of present illness. MS: Negative for joint pain, low back pain.  Derm: Negative for rash or itching.  Endo: Negative for unusual weight change.  Heme: Negative for bruising or bleeding. Allergy: Negative for rash or hives.    Physical Exam: BP (!) 145/76   Pulse 76   Temp 97.1 F (36.2 C) (Oral)   Ht 5\' 6"  (1.676 m)   Wt 128 lb (58.1 kg)   BMI 20.66 kg/m  General:   Alert and oriented. Pleasant and cooperative. Well-nourished and well-developed.  Head:  Normocephalic and atraumatic. Eyes:  Without icterus, sclera clear  and conjunctiva pink.  Ears:  Normal auditory acuity. Cardiovascular:  S1, S2 present without murmurs appreciated. Extremities without clubbing or edema. Respiratory:  Clear to auscultation bilaterally. No wheezes, rales, or rhonchi. No distress.  Gastrointestinal:  +BS, soft, non-tender and non-distended. No HSM noted. No guarding or rebound. No masses appreciated.  Rectal:  Deferred  Musculoskalatal:  Symmetrical without gross deformities. Neurologic:  Alert and oriented x4;  grossly normal neurologically. Psych:  Alert and cooperative. Normal mood and affect. Heme/Lymph/Immune: No excessive bruising noted.    08/31/2016 11:45 AM   Disclaimer: This note was dictated with voice recognition software. Similar sounding words can inadvertently be transcribed and may not be corrected upon review.

## 2016-09-15 NOTE — Op Note (Signed)
[facility] Patient Name: Craig Baird Procedure Date: 09/15/2016 1:37 PM MRN: [id number] Date of Birth: 08/13/1933 Attending MD: Norvel Richards , MD CSN: [id number] Age: 83 Admit Type: Outpatient Procedure:                Colonoscopy Indications:              Screening for colorectal malignant neoplasm Providers:                Norvel Richards, MD, Janeece Riggers, RN, Aram Candela Referring MD:              Medicines:                Midazolam 4 mg IV, Meperidine 50 mg IV, Ondansetron                            4 mg IV Complications:            No immediate complications. Estimated Blood Loss:     Estimated blood loss was minimal. Procedure:                Pre-Anesthesia Assessment:                           - Prior to the procedure, a History and Physical                            was performed, and patient medications and                            allergies were reviewed. The patient's tolerance of                            previous anesthesia was also reviewed. The risks                            and benefits of the procedure and the sedation                            options and risks were discussed with the patient.                            All questions were answered, and informed consent                            was obtained. Prior Anticoagulants: The patient has                            taken no previous anticoagulant or antiplatelet                            agents. ASA Grade Assessment: II - A patient with  mild systemic disease. After reviewing the risks                            and benefits, the patient was deemed in                            satisfactory condition to undergo the procedure.                           After obtaining informed consent, the colonoscope                            was passed under direct vision. Throughout the                            procedure, the patient's  blood pressure, pulse, and                            oxygen saturations were monitored continuously. The                            EC-[id number] ([id number]) scope was introduced through                            the anus and advanced to the the cecum, identified                            by appendiceal orifice and ileocecal valve. The                            ileocecal valve, appendiceal orifice, and rectum                            were photographed. The entire colon was well                            visualized. Anatomical landmarks were photographed.                            The quality of the bowel preparation was adequate. Scope In: 1:41:05 PM Scope Out: 1:53:17 PM Scope Withdrawal Time: 0 hours 7 minutes 56 seconds  Total Procedure Duration: 0 hours 12 minutes 12 seconds  Findings:      The perianal and digital rectal examinations were normal.      A polyp was found in the ascending colon. The polyp was       semi-pedunculated (4 mm). The polyp was removed with a cold snare.       Resection and retrieval were complete. Estimated blood loss was minimal.      A 3 mm polyp was found in the rectum. The polyp was sessile. The polyp       was removed with a cold snare. Resection and retrieval were complete.       Estimated blood loss was minimal.      A 7 mm polyp was found in the rectum. The polyp  was sessile. The polyp       was removed with a hot snare. Resection and retrieval were complete.       Estimated blood loss: none.      Scattered small and large-mouthed diverticula were found in the sigmoid       colon and descending colon. Impression:               - One polyp in the ascending colon, removed with a                            cold snare. Resected and retrieved.                           - One 3 mm polyp in the rectum, removed with a cold                            snare. Resected and retrieved.                           - One 7 mm polyp in the rectum, removed with a hot                             snare. Resected and retrieved.                           - Diverticulosis in the sigmoid colon and in the                            descending colon. Moderate Sedation:      Moderate (conscious) sedation was administered by the endoscopy nurse       and supervised by the endoscopist. The following parameters were       monitored: oxygen saturation, heart rate, blood pressure, respiratory       rate, EKG, adequacy of pulmonary ventilation, and response to care.       Total physician intraservice time was 30 minutes. Recommendation:           - Patient has a contact number available for                            emergencies. The signs and symptoms of potential                            delayed complications were discussed with the                            patient. Return to normal activities tomorrow.                            Written discharge instructions were provided to the                            patient.                           - Resume previous  diet.                           - Continue present medications.                           - Repeat colonoscopy date to be determined after                            pending pathology results are reviewed for                            surveillance based on pathology results.                           - Return to GI clinic in 8 weeks. Begin probiotic                            in the way of Align 1 capsule daily. See EGD report. Procedure Code(s):        --- Professional ---                           [id number], Colonoscopy, flexible; with removal of                            tumor(s), polyp(s), or other lesion(s) by snare                            technique                           99152, Moderate sedation services provided by the                            same physician or other qualified health care                            professional performing the diagnostic or                             therapeutic service that the sedation supports,                            requiring the presence of an independent trained                            observer to assist in the monitoring of the                            patient's level of consciousness and physiological                            status; initial 15 minutes of intraservice time,  patient age 102 years or older                           [id number], Moderate sedation services; each additional                            15 minutes intraservice time Diagnosis Code(s):        --- Professional ---                           Z12.11, Encounter for screening for malignant                            neoplasm of colon                           D12.2, Benign neoplasm of ascending colon                           K62.1, Rectal polyp                           K57.30, Diverticulosis of large intestine without                            perforation or abscess without bleeding CPT copyright 2016 American Medical Association. All rights reserved. The codes documented in this report are preliminary and upon coder review may  be revised to meet current compliance requirements. Cristopher Estimable. Aydeen Blume, MD Norvel Richards, MD 09/15/2016 2:00:02 PM This report has been signed electronically. Number of Addenda: 0

## 2016-09-15 NOTE — Interval H&P Note (Signed)
History and Physical Interval Note:  09/15/2016 1:20 PM  Craig Baird  has presented today for surgery, with the diagnosis of WEIGHT LOSS/NAUSEA  The various methods of treatment have been discussed with the patient and family. After consideration of risks, benefits and other options for treatment, the patient has consented to  Procedure(s) with comments: COLONOSCOPY (N/A) - 1030  ESOPHAGOGASTRODUODENOSCOPY (EGD) (N/A) as a surgical intervention .  The patient's history has been reviewed, patient examined, no change in status, stable for surgery.  I have reviewed the patient's chart and labs.  Questions were answered to the patient's satisfaction.     Craig Baird  Symptoms have improved but not resolved. Denies dysphagia. EGD and colonoscopy per plan.   The risks, benefits, limitations, imponderables and alternatives regarding both EGD and colonoscopy have been reviewed with the patient. Questions have been answered. All parties agreeable.

## 2016-09-15 NOTE — Discharge Instructions (Addendum)
°Colonoscopy °Discharge Instructions ° °Read the instructions outlined below and refer to this sheet in the next few weeks. These discharge instructions provide you with general information on caring for yourself after you leave the hospital. Your doctor may also give you specific instructions. While your treatment has been planned according to the most current medical practices available, unavoidable complications occasionally occur. If you have any problems or questions after discharge, call Dr. Rourk at [phone]. °ACTIVITY °· You may resume your regular activity, but move at a slower pace for the next 24 hours.  °· Take frequent rest periods for the next 24 hours.  °· Walking will help get rid of the air and reduce the bloated feeling in your belly (abdomen).  °· No driving for 24 hours (because of the medicine (anesthesia) used during the test).   °· Do not sign any important legal documents or operate any machinery for 24 hours (because of the anesthesia used during the test).  °NUTRITION °· Drink plenty of fluids.  °· You may resume your normal diet as instructed by your doctor.  °· Begin with a light meal and progress to your normal diet. Heavy or fried foods are harder to digest and may make you feel sick to your stomach (nauseated).  °· Avoid alcoholic beverages for 24 hours or as instructed.  °MEDICATIONS °· You may resume your normal medications unless your doctor tells you otherwise.  °WHAT YOU CAN EXPECT TODAY °· Some feelings of bloating in the abdomen.  °· Passage of more gas than usual.  °· Spotting of blood in your stool or on the toilet paper.  °IF YOU HAD POLYPS REMOVED DURING THE COLONOSCOPY: °· No aspirin products for 7 days or as instructed.  °· No alcohol for 7 days or as instructed.  °· Eat a soft diet for the next 24 hours.  °FINDING OUT THE RESULTS OF YOUR TEST °Not all test results are available during your visit. If your test results are not back during the visit, make an appointment  with your caregiver to find out the results. Do not assume everything is normal if you have not heard from your caregiver or the medical facility. It is important for you to follow up on all of your test results.  °SEEK IMMEDIATE MEDICAL ATTENTION IF: °· You have more than a spotting of blood in your stool.  °· Your belly is swollen (abdominal distention).  °· You are nauseated or vomiting.  °· You have a temperature over 101.  °· You have abdominal pain or discomfort that is severe or gets worse throughout the day.  °EGD °Discharge instructions °Please read the instructions outlined below and refer to this sheet in the next few weeks. These discharge instructions provide you with general information on caring for yourself after you leave the hospital. Your doctor may also give you specific instructions. While your treatment has been planned according to the most current medical practices available, unavoidable complications occasionally occur. If you have any problems or questions after discharge, please call your doctor. °ACTIVITY °· You may resume your regular activity but move at a slower pace for the next 24 hours.  °· Take frequent rest periods for the next 24 hours.  °· Walking will help expel (get rid of) the air and reduce the bloated feeling in your abdomen.  °· No driving for 24 hours (because of the anesthesia (medicine) used during the test).  °· You may shower.  °· Do not sign any important   legal documents or operate any machinery for 24 hours (because of the anesthesia used during the test).  NUTRITION  Drink plenty of fluids.   You may resume your normal diet.   Begin with a light meal and progress to your normal diet.   Avoid alcoholic beverages for 24 hours or as instructed by your caregiver.  MEDICATIONS  You may resume your normal medications unless your caregiver tells you otherwise.  WHAT YOU CAN EXPECT TODAY  You may experience abdominal discomfort such as a feeling of fullness  or gas pains.  FOLLOW-UP  Your doctor will discuss the results of your test with you.  SEEK IMMEDIATE MEDICAL ATTENTION IF ANY OF THE FOLLOWING OCCUR:  Excessive nausea (feeling sick to your stomach) and/or vomiting.   Severe abdominal pain and distention (swelling).   Trouble swallowing.   Temperature over 101 F (37.8 C).   Rectal bleeding or vomiting of blood.    Colon polyp and diverticulosis information provided   Begin Align - Probiotic 1 capsule daily  Further recommendations to follow pending review of pathology report  Office visit with [place] in 8 weeks.   Diverticulosis Diverticulosis is a condition that develops when small pouches (diverticula) form in the wall of the large intestine (colon). The colon is where water is absorbed and stool is formed. The pouches form when the inside layer of the colon pushes through weak spots in the outer layers of the colon. You may have a few pouches or many of them. What are the causes? The cause of this condition is not known. What increases the risk? The following factors may make you more likely to develop this condition:  Being older than age 56. Your risk for this condition increases with age. Diverticulosis is rare among people younger than age 34. By age 29, many people have it.  Eating a low-fiber diet.  Having frequent constipation.  Being overweight.  Not getting enough exercise.  Smoking.  Taking over-the-counter pain medicines, like aspirin and ibuprofen.  Having a family history of diverticulosis.  What are the signs or symptoms? In most people, there are no symptoms of this condition. If you do have symptoms, they may include:  Bloating.  Cramps in the abdomen.  Constipation or diarrhea.  Pain in the lower left side of the abdomen.  How is this diagnosed? This condition is most often diagnosed during an exam for other colon problems. Because diverticulosis usually has no symptoms, it often  cannot be diagnosed independently. This condition may be diagnosed by:  Using a flexible scope to examine the colon (colonoscopy).  Taking an X-ray of the colon after dye has been put into the colon (barium enema).  Doing a CT scan.  How is this treated? You may not need treatment for this condition if you have never developed an infection related to diverticulosis. If you have had an infection before, treatment may include:  Eating a high-fiber diet. This may include eating more fruits, vegetables, and grains.  Taking a fiber supplement.  Taking a live bacteria supplement (probiotic).  Taking medicine to relax your colon.  Taking antibiotic medicines.  Follow these instructions at home:  Drink 6-8 glasses of water or more each day to prevent constipation.  Try not to strain when you have a bowel movement.  If you have had an infection before: ? Eat more fiber as directed by your health care provider or your diet and nutrition specialist (dietitian). ? Take a fiber supplement or probiotic,  if your health care provider approves.  Take over-the-counter and prescription medicines only as told by your health care provider.  If you were prescribed an antibiotic, take it as told by your health care provider. Do not stop taking the antibiotic even if you start to feel better.  Keep all follow-up visits as told by your health care provider. This is important. Contact a health care provider if:  You have pain in your abdomen.  You have bloating.  You have cramps.  You have not had a bowel movement in 3 days. Get help right away if:  Your pain gets worse.  Your bloating becomes very bad.  You have a fever or chills, and your symptoms suddenly get worse.  You vomit.  You have bowel movements that are bloody or black.  You have bleeding from your rectum. Summary  Diverticulosis is a condition that develops when small pouches (diverticula) form in the wall of the large  intestine (colon).  You may have a few pouches or many of them.  This condition is most often diagnosed during an exam for other colon problems.  If you have had an infection related to diverticulosis, treatment may include increasing the fiber in your diet, taking supplements, or taking medicines. This information is not intended to replace advice given to you by your health care provider. Make sure you discuss any questions you have with your health care provider. Document Released: 11/25/2003 Document Revised: 01/17/2016 Document Reviewed: 01/17/2016 Elsevier Interactive Patient Education  2017 [place].  Colon Polyps Polyps are tissue growths inside the body. Polyps can grow in many places, including the large intestine (colon). A polyp may be a round bump or a mushroom-shaped growth. You could have one polyp or several. Most colon polyps are noncancerous (benign). However, some colon polyps can become cancerous over time. What are the causes? The exact cause of colon polyps is not known. What increases the risk? This condition is more likely to develop in people who:  Have a family history of colon cancer or colon polyps.  Are older than 10 or older than 45 if they are African American.  Have inflammatory bowel disease, such as ulcerative colitis or Crohn disease.  Are overweight.  Smoke cigarettes.  Do not get enough exercise.  Drink too much alcohol.  Eat a diet that is: ? High in fat and red meat. ? Low in fiber.  Had childhood cancer that was treated with abdominal radiation.  What are the signs or symptoms? Most polyps do not cause symptoms. If you have symptoms, they may include:  Blood coming from your rectum when having a bowel movement.  Blood in your stool.The stool may look dark red or black.  A change in bowel habits, such as constipation or diarrhea.  How is this diagnosed? This condition is diagnosed with a colonoscopy. This is a procedure  that uses a lighted, flexible scope to look at the inside of your colon. How is this treated? Treatment for this condition involves removing any polyps that are found. Those polyps will then be tested for cancer. If cancer is found, your health care provider will talk to you about options for colon cancer treatment. Follow these instructions at home: Diet  Eat plenty of fiber, such as fruits, vegetables, and whole grains.  Eat foods that are high in calcium and vitamin D, such as milk, cheese, yogurt, eggs, liver, fish, and broccoli.  Limit foods high in fat, red meats, and processed meats,  such as hot dogs, sausage, bacon, and lunch meats.  Maintain a healthy weight, or lose weight if recommended by your health care provider. General instructions  Do not smoke cigarettes.  Do not drink alcohol excessively.  Keep all follow-up visits as told by your health care provider. This is important. This includes keeping regularly scheduled colonoscopies. Talk to your health care provider about when you need a colonoscopy.  Exercise every day or as told by your health care provider. Contact a health care provider if:  You have new or worsening bleeding during a bowel movement.  You have new or increased blood in your stool.  You have a change in bowel habits.  You unexpectedly lose weight. This information is not intended to replace advice given to you by your health care provider. Make sure you discuss any questions you have with your health care provider. Document Released: 11/24/2003 Document Revised: 08/05/2015 Document Reviewed: 01/18/2015 Elsevier Interactive Patient Education  [employer].

## 2016-09-19 ENCOUNTER — Encounter (HOSPITAL_COMMUNITY): Payer: Self-pay | Admitting: Internal Medicine

## 2016-09-19 ENCOUNTER — Encounter: Payer: Self-pay | Admitting: Internal Medicine

## 2016-09-23 ENCOUNTER — Other Ambulatory Visit: Payer: Self-pay | Admitting: Family

## 2016-09-23 DIAGNOSIS — F411 Generalized anxiety disorder: Secondary | ICD-10-CM

## 2016-09-25 ENCOUNTER — Telehealth: Payer: Self-pay | Admitting: Internal Medicine

## 2016-09-25 NOTE — Telephone Encounter (Signed)
Pt called; hadn't received path letter as of yet.  I reviewed path w pts wife; do not recommend a future tcs.

## 2016-09-26 ENCOUNTER — Telehealth: Payer: Self-pay | Admitting: *Deleted

## 2016-09-27 ENCOUNTER — Encounter: Payer: Self-pay | Admitting: Internal Medicine

## 2016-09-27 NOTE — Telephone Encounter (Signed)
Do not see where any recent phone calls have been made to patient.  Patient aware

## 2016-09-29 ENCOUNTER — Encounter: Payer: Self-pay | Admitting: Family Medicine

## 2016-09-29 ENCOUNTER — Ambulatory Visit (INDEPENDENT_AMBULATORY_CARE_PROVIDER_SITE_OTHER): Payer: Medicare Other | Admitting: Family Medicine

## 2016-09-29 VITALS — BP 134/72 | HR 84 | Temp 97.3°F | Ht 66.0 in | Wt 127.4 lb

## 2016-09-29 DIAGNOSIS — F411 Generalized anxiety disorder: Secondary | ICD-10-CM | POA: Diagnosis not present

## 2016-09-29 DIAGNOSIS — R634 Abnormal weight loss: Secondary | ICD-10-CM | POA: Diagnosis not present

## 2016-09-29 DIAGNOSIS — R030 Elevated blood-pressure reading, without diagnosis of hypertension: Secondary | ICD-10-CM

## 2016-09-29 MED ORDER — ESCITALOPRAM OXALATE 10 MG PO TABS: 10.0000 mg | ORAL_TABLET | Freq: Every day | ORAL | 5 refills | Status: DC

## 2016-09-29 NOTE — Progress Notes (Signed)
BP 134/72   Pulse 84   Temp (!) 97.3 F (36.3 C) (Oral)   Ht 5\' 6"  (1.676 m)   Wt 127 lb 6 oz (57.8 kg)   BMI 20.56 kg/m    Subjective:    Patient ID: Craig Baird, male    DOB: 10-20-1933, 83 y.o.   MRN: [id number]  HPI: Craig Baird is a 83 y.o. male presenting on 09/29/2016 for BP elevated recently (took BP at pharmacy and it was elevated, wanted to make sure it was normal here)   HPI Anxiety and weight loss Patient feels like he's been having a lot of issues with anxiety and losing weight recently. He feels like he's lost 15 pounds over the past few months. He has been anxious for quite some time throughout his life and has previously been on medications for it was given Wellbutrin but did not like the dosing of Wellbutrin so he never tried it. He also did not like the side effects of Wellbutrin so he did not try that either. He has been feeling anxious a lot and sometimes keeps him up at night. He denies any suicidal ideations or thoughts of hurting Himself. He just had a colonoscopy which was normal and denies any urinary or prostate issues. He denies any cough or shortness of breath or chest pain. Depression screen  [facility] 2/9 09/29/2016 08/22/2016 08/14/2016 06/15/2016 05/05/2016  Decreased Interest 0 0 0 0 0  Down, Depressed, Hopeless 0 0 0 0 0  PHQ - 2 Score 0 0 0 0 0  Altered sleeping - - - - -  Tired, decreased energy - - - - -  Change in appetite - - - - -  Feeling bad or failure about yourself  - - - - -  Trouble concentrating - - - - -  Moving slowly or fidgety/restless - - - - -  Suicidal thoughts - - - - -  PHQ-9 Score - - - - -  Difficult doing work/chores - - - - -    Elevated BP reading Patient had an elevated BP reading where he checked it at a pharmacy earlier today and he wanted to come in to check it to see if that was accurate. His BP today is 134/72.  Relevant past medical, surgical, family and social history reviewed and updated as indicated. Interim  medical history since our last visit reviewed. Allergies and medications reviewed and updated.  Review of Systems  Constitutional: Positive for fatigue and unexpected weight change. Negative for chills and fever.  Eyes: Negative for discharge.  Respiratory: Negative for shortness of breath and wheezing.   Cardiovascular: Negative for chest pain and leg swelling.  Musculoskeletal: Negative for back pain and gait problem.  Skin: Negative for rash.  Neurological: Negative for dizziness, weakness, light-headedness and numbness.  Psychiatric/Behavioral: Negative for decreased concentration, dysphoric mood, self-injury, sleep disturbance and suicidal ideas. The patient is nervous/anxious.   All other systems reviewed and are negative.   Per HPI unless specifically indicated above        Objective:    BP 134/72   Pulse 84   Temp (!) 97.3 F (36.3 C) (Oral)   Ht 5\' 6"  (1.676 m)   Wt 127 lb 6 oz (57.8 kg)   BMI 20.56 kg/m   Wt Readings from Last 3 Encounters:  09/29/16 127 lb 6 oz (57.8 kg)  09/15/16 128 lb (58.1 kg)  08/31/16 128 lb (58.1 kg)    Physical Exam  Constitutional: He is oriented to person, place, and time. He appears well-developed and well-nourished. No distress.  Eyes: Conjunctivae are normal. No scleral icterus.  Neck: Neck supple. No thyromegaly present.  Cardiovascular: Normal rate, regular rhythm, normal heart sounds and intact distal pulses.   No murmur heard. Pulmonary/Chest: Effort normal and breath sounds normal. No respiratory distress. He has no wheezes. He has no rales.  Abdominal: Soft. Bowel sounds are normal. He exhibits no distension. There is no tenderness.  Musculoskeletal: Normal range of motion. He exhibits no edema.  Lymphadenopathy:    He has no cervical adenopathy.  Neurological: He is alert and oriented to person, place, and time. Coordination normal.  Skin: Skin is warm and dry. No rash noted. He is not diaphoretic.  Psychiatric: He has a  normal mood and affect. His behavior is normal.  Nursing note and vitals reviewed.       Assessment & Plan:   Problem List Items Addressed This Visit      Other   GAD (generalized anxiety disorder)   Relevant Medications   escitalopram (LEXAPRO) 10 MG tablet   Other Relevant Orders   Thyroid Panel With TSH   Loss of weight   Relevant Orders   Thyroid Panel With TSH    Other Visit Diagnoses    Elevated BP without diagnosis of hypertension    -  Primary       Follow up plan: Return in about 4 weeks (around 10/27/2016), or if symptoms worsen or fail to improve, for Recheck anxiety.  Counseling provided for all of the vaccine components Orders Placed This Encounter  Procedures  . Thyroid Panel With TSH    Caryl Pina, MD [facility] 09/29/2016, 6:02 PM

## 2016-10-01 LAB — THYROID PANEL WITH TSH
Free Thyroxine Index: 1.5 (ref 1.2–4.9)
T3 UPTAKE RATIO: 24 % (ref 24–39)
T4 TOTAL: 6.3 ug/dL (ref 4.5–12.0)
TSH: 1.78 u[IU]/mL (ref 0.450–4.500)

## 2016-10-02 ENCOUNTER — Telehealth: Payer: Self-pay | Admitting: Family Medicine

## 2016-10-02 NOTE — Telephone Encounter (Signed)
Patient's wife informed that results have not been reviewed and we will contact them when they are.

## 2016-10-05 MED ORDER — SERTRALINE HCL 25 MG PO TABS: 25.0000 mg | ORAL_TABLET | Freq: Every day | ORAL | 1 refills | Status: DC

## 2016-10-05 NOTE — Telephone Encounter (Signed)
Pt aware labwork was normal  He took one dose of the generic lexapro felt like he was going to 'pass out' would like something else called [facility] to [facility]

## 2016-10-05 NOTE — Telephone Encounter (Signed)
Patient aware and rx sent to pharmacy.  

## 2016-10-05 NOTE — Telephone Encounter (Signed)
Please call in Zoloft 25 mg, once daily and given 30 with 1 refill

## 2016-10-23 ENCOUNTER — Telehealth: Payer: Self-pay | Admitting: Internal Medicine

## 2016-10-23 NOTE — Telephone Encounter (Signed)
Samples are at the front desk. pts wife- Phineas Semen is aware. Pt is refusing to see anyone other than RMR.

## 2016-10-23 NOTE — Telephone Encounter (Signed)
We are one Dr. down  This week and next.  Let's try to get him in with next available with extender. However, I think it would be worthwhile trying him on acid suppression first. Let's have him come in and get Dexilant samples-60 mg once daily 14 days. No more Zofran. Let's see how this works and we will go from there.

## 2016-10-23 NOTE — Telephone Encounter (Signed)
Spoke with the pt, he said he took align for a month and it hasnt helped at all. He still continues to c/o constant nausea. No vomiting. He feels weak all the time. He has 3-4 bowel movements a day, all BM's are normal. Every once in awhile will have a runny stool but not much. No blood in his stool, no dark stools. Pt is out of zofran but feels like it didn't help anyway. Pt has ov on 11/21/16 but doesn't think he can take this until his ov.  Dr.Rourk, do you have any additional recommendations for him?

## 2016-10-23 NOTE — Telephone Encounter (Signed)
PATIENT CALLED AND STATED HE IS VERY NAUSEATED AND WEAK.  DOES NOT KNOW IF HE WILL MAKE IT TO HIS NEXT APPOINTMENT, WANTS TO SEE RMR AND WANTED TO KNOW IF HE COULD WORK HIM IN .  [phone]

## 2016-10-24 ENCOUNTER — Ambulatory Visit: Payer: Self-pay | Admitting: Gastroenterology

## 2016-10-27 ENCOUNTER — Emergency Department (HOSPITAL_COMMUNITY)
Admission: EM | Admit: 2016-10-27 | Discharge: 2016-10-27 | Disposition: A | Discharge status: Home / Self Care | Payer: Medicare Other | Attending: Emergency Medicine | Admitting: Emergency Medicine

## 2016-10-27 ENCOUNTER — Encounter (HOSPITAL_COMMUNITY): Payer: Self-pay | Admitting: Emergency Medicine

## 2016-10-27 DIAGNOSIS — K529 Noninfective gastroenteritis and colitis, unspecified: Secondary | ICD-10-CM | POA: Diagnosis not present

## 2016-10-27 DIAGNOSIS — Z7982 Long term (current) use of aspirin: Secondary | ICD-10-CM | POA: Insufficient documentation

## 2016-10-27 DIAGNOSIS — Z79899 Other long term (current) drug therapy: Secondary | ICD-10-CM | POA: Insufficient documentation

## 2016-10-27 DIAGNOSIS — R110 Nausea: Secondary | ICD-10-CM | POA: Diagnosis not present

## 2016-10-27 DIAGNOSIS — R197 Diarrhea, unspecified: Secondary | ICD-10-CM | POA: Diagnosis not present

## 2016-10-27 HISTORY — DX: Diverticulosis of intestine, part unspecified, without perforation or abscess without bleeding: K57.90

## 2016-10-27 HISTORY — DX: Diaphragmatic hernia without obstruction or gangrene: K44.9

## 2016-10-27 LAB — COMPREHENSIVE METABOLIC PANEL
ALBUMIN: 3.4 g/dL — AB (ref 3.5–5.0)
ALT: 17 U/L (ref 17–63)
AST: 22 U/L (ref 15–41)
Alkaline Phosphatase: 70 U/L (ref 38–126)
Anion gap: 5 (ref 5–15)
BILIRUBIN TOTAL: 0.4 mg/dL (ref 0.3–1.2)
BUN: 14 mg/dL (ref 6–20)
CO2: 28 mmol/L (ref 22–32)
Calcium: 8.5 mg/dL — ABNORMAL LOW (ref 8.9–10.3)
Chloride: 110 mmol/L (ref 101–111)
Creatinine, Ser: 0.98 mg/dL (ref 0.61–1.24)
GFR calc Af Amer: >60 mL/min (ref >60)
GFR calc non Af Amer: >60 mL/min (ref >60)
GLUCOSE: 118 mg/dL — AB (ref 65–99)
POTASSIUM: 4.2 mmol/L (ref 3.5–5.1)
SODIUM: 143 mmol/L (ref 135–145)
Total Protein: 6.2 g/dL — ABNORMAL LOW (ref 6.5–8.1)

## 2016-10-27 LAB — CBC WITH DIFFERENTIAL/PLATELET
BASOS ABS: 0 10*3/uL (ref 0.0–0.1)
BASOS PCT: 1 %
Eosinophils Absolute: 0.3 10*3/uL (ref 0.0–0.7)
Eosinophils Relative: 5 %
HEMATOCRIT: 37.8 % — AB (ref 39.0–52.0)
Hemoglobin: 13 g/dL (ref 13.0–17.0)
Lymphocytes Relative: 26 %
Lymphs Abs: 1.6 10*3/uL (ref 0.7–4.0)
MCH: 32.8 pg (ref 26.0–34.0)
MCHC: 34.4 g/dL (ref 30.0–36.0)
MCV: 95.5 fL (ref 78.0–100.0)
MONO ABS: 0.9 10*3/uL (ref 0.1–1.0)
Monocytes Relative: 14 %
NEUTROS ABS: 3.3 10*3/uL (ref 1.7–7.7)
NEUTROS PCT: 54 %
Platelets: 230 10*3/uL (ref 150–400)
RBC: 3.96 MIL/uL — ABNORMAL LOW (ref 4.22–5.81)
RDW: 13.5 % (ref 11.5–15.5)
WBC: 6.1 10*3/uL (ref 4.0–10.5)

## 2016-10-27 MED ORDER — SODIUM CHLORIDE 0.9 % IV BOLUS (SEPSIS): 500.0000 mL | Freq: Once | INTRAVENOUS | Status: DC

## 2016-10-27 NOTE — ED Provider Notes (Signed)
[facility] DEPT Provider Note   CSN: [id number] Arrival date & time: 10/27/16  1934     History   Chief Complaint Chief Complaint  Patient presents with  . Diarrhea    HPI Craig Baird is a 83 y.o. male.  HPI  83 year old male with a history of anxiety, sinusitis, hyperlipidemia percent today complaining of some nausea and frequent stooling. He states that his symptoms began in March or April when he had a sinus infection. He states he then lost about 15 pounds was started to gain weight back. He can complained of abdominal discomfort with nausea at that time. He was seen by Dr. Sydell Axon and had an upper and lower GI done. He states at that time there were some polyps removed. He was then placed on probiotics for a month. A week ago he continued to have symptoms and was placed on Dexilant. He states he has continued to have frequent stooling over the past week. He is having stools 4-5 times per day. No dark or bloody stools. However, today he ate breakfast, lunch and barbecue for supper. He is in fact gaining some weight. He denies any headache, head injury, neck pain, chest pain, dyspnea, abdominal pain, urinary tract infection symptoms.  Results of colonoscopy and upper gi done 09/15/16: A 3 mm polyp was found in the rectum. The polyp was sessile. The polyp was removed with a cold snare. Resection and retrieval were complete. Estimated blood loss was minimal. A 7 mm polyp was found in the rectum. The polyp was sessile. The polyp was removed with a hot snare Normal esophagus. - Small hiatal hernia. - The examination was otherwise normal. - Normal duodenal bulb and second portion of the duodenum. - No specimens collected. Past Medical History:  Diagnosis Date  . Allergy   . Anxiety   . Cataract   . Diverticulosis   . Hernia    L inguinal hernia  . Hiatal hernia   . Hyperlipidemia   . Sinusitis     Patient Active Problem List   Diagnosis Date Noted  . Loss of weight  08/31/2016  . Nausea without vomiting 08/31/2016  . Preventative health care 12/16/2015  . Osteopenia 11/23/2015  . Dermatitis 04/05/2015  . GAD (generalized anxiety disorder) 10/23/2014  . Insomnia 10/23/2014  . Arthritis 10/23/2014  . Hyperlipidemia 10/23/2014  . Allergic rhinitis 08/12/2012    Past Surgical History:  Procedure Laterality Date  . COLONOSCOPY N/A 09/15/2016   Procedure: COLONOSCOPY;  Surgeon: Daneil Dolin, MD;  Location: [facility] SUITE;  Service: Endoscopy;  Laterality: N/A;  1030   . ESOPHAGOGASTRODUODENOSCOPY N/A 09/15/2016   Procedure: ESOPHAGOGASTRODUODENOSCOPY (EGD);  Surgeon: Daneil Dolin, MD;  Location: [facility] SUITE;  Service: Endoscopy;  Laterality: N/A;  . THYROID CYST EXCISION  1973  . TONSILLECTOMY         Home Medications    Prior to Admission medications   Medication Sig Start Date End Date Taking? Authorizing Provider  ACETAMINOPHEN PO Take 2 tablets by mouth 2 (two) times daily as needed (pain).    [provider]  aspirin EC 81 MG tablet Take 1 tablet (81 mg total) by mouth daily. Patient taking differently: Take 81 mg by mouth See admin instructions. Takes 3 times a month 04/01/14   Claretta Fraise, MD  buPROPion (WELLBUTRIN XL) 150 MG 24 hr tablet TAKE 1 TABLET (150 MG TOTAL) BY MOUTH DAILY. Patient not taking: Reported on 09/29/2016 09/25/16   Lodema Pilot, PA-C  fluticasone (FLONASE) 50 MCG/ACT nasal spray Place 2 sprays into both nostrils daily. Patient taking differently: Place 2 sprays into both nostrils daily as needed for allergies.  08/14/16   Hassell Done, Mary-Margaret, FNP  sertraline (ZOLOFT) 25 MG tablet Take 1 tablet (25 mg total) by mouth daily. 10/05/16   Dettinger, Fransisca Kaufmann, MD    Family History Family History  Problem Relation Age of Onset  . Hypertension Mother   . Stroke Mother   . Parkinson's disease Mother   . Hip fracture Mother   . Hypertension Brother 38  . Heart disease Brother   . Birth defects Father         spine  . Early death Sister   . Early death Sister   . Colon cancer Neg Hx   . Gastric cancer Neg Hx   . Esophageal cancer Neg Hx     Social History Social History  Substance Use Topics  . Smoking status: Never Smoker  . Smokeless tobacco: Never Used  . Alcohol use No     Allergies   Cialis [tadalafil]; Naproxen; and Sulfa antibiotics   Review of Systems Review of Systems  Constitutional: Positive for activity change and fatigue. Negative for chills and fever.  HENT: Negative.   Eyes: Negative.   Respiratory: Negative.   Cardiovascular: Negative.   Gastrointestinal: Positive for diarrhea and nausea. Negative for blood in stool.  Endocrine: Negative.   Genitourinary: Negative.   Musculoskeletal: Negative.   Allergic/Immunologic: Negative.   Neurological: Negative.   Hematological: Negative.   All other systems reviewed and are negative.    Physical Exam Updated Vital Signs BP 138/61 (BP Location: Right Arm)   Pulse 80   Temp 98.1 F (36.7 C) (Oral)   Resp 18   Ht 1.702 m (5\' 7" )   Wt 59 kg (130 lb)   SpO2 98%   BMI 20.36 kg/m   Physical Exam  Constitutional: He is oriented to person, place, and time. He appears well-developed.  HENT:  Head: Normocephalic and atraumatic.  Right Ear: External ear normal.  Left Ear: External ear normal.  Nose: Nose normal.  Eyes: Pupils are equal, round, and reactive to light. EOM are normal.  Neck: Normal range of motion. No tracheal deviation present.  Cardiovascular: Normal rate, regular rhythm and normal heart sounds.   Pulmonary/Chest: Effort normal.  Abdominal: Soft. Bowel sounds are normal.  Musculoskeletal: Normal range of motion.  Neurological: He is alert and oriented to person, place, and time.  Skin: Skin is warm and dry.  Psychiatric: He has a normal mood and affect. His behavior is normal.  Nursing note and vitals reviewed.    ED Treatments / Results  Labs (all labs ordered are listed, but  only abnormal results are displayed) Labs Reviewed  C DIFFICILE QUICK SCREEN W PCR REFLEX  CBC WITH DIFFERENTIAL/PLATELET  COMPREHENSIVE METABOLIC PANEL  POC OCCULT BLOOD, ED    EKG  EKG Interpretation None       Radiology No results found.  Procedures Procedures (including critical care time)  Medications Ordered in ED Medications  sodium chloride 0.9 % bolus 500 mL (not administered)     Initial Impression / Assessment and Plan / ED Course  I have reviewed the triage vital signs and the nursing notes.  Pertinent labs & imaging results that were available during my care of the patient were reviewed by me and considered in my medical decision making (see chart for details).     C. difficile ordered.  Received call from add in lab who states they are unable to perform C. difficile on the stool as it is too formed-I question whether this was just criteria or whether they were actually unable to perform it on the sample. I was informed they're unable to perform it on this sample. Vitals:   10/27/16 1941  BP: 138/61  Pulse: 80  Resp: 18  Temp: 98.1 F (36.7 C)  SpO2: 98%     Final Clinical Impressions(s) / ED Diagnoses   Final diagnoses:  Frequent stools    New Prescriptions New Prescriptions   No medications on file     Pattricia Boss, MD 10/27/16 2114

## 2016-10-27 NOTE — ED Triage Notes (Signed)
Pt c/o constant nausea and weakness since July. Pt states he has been having multiple stools over the last several days. Pt states it is normal in color.

## 2016-10-27 NOTE — Discharge Instructions (Signed)
Labs appear normal.  Please follow up with Dr. Gala Romney next week.

## 2016-10-27 NOTE — ED Notes (Signed)
Occult blood: Negative

## 2016-10-28 ENCOUNTER — Telehealth: Payer: Self-pay | Admitting: Internal Medicine

## 2016-10-28 NOTE — Telephone Encounter (Signed)
Patient's wife called last night. Said patient has had diarrhea for several days week wanting to lay down all the time. She was unsure about nausea. Stated Dexilant 60 mg has not helped, however.. She is worried about patient. No fever or bleeding.  Patient may be dehydrated and significantly ill. I recommended she take him to the emergency department.

## 2016-11-01 ENCOUNTER — Telehealth: Payer: Self-pay | Admitting: Internal Medicine

## 2016-11-01 NOTE — Telephone Encounter (Signed)
Pt's wife called back and she said that they have not tried the Imodium.

## 2016-11-01 NOTE — Telephone Encounter (Signed)
Has good ole Imodium been tried?

## 2016-11-01 NOTE — Telephone Encounter (Signed)
Tried to call pt- NA-No voicemail.

## 2016-11-01 NOTE — Telephone Encounter (Signed)
Pt's wife called to say that patient is having a bowel movement 5-6 times a day and needs a prescription (generic) for [facility] [place]. [phone]

## 2016-11-01 NOTE — Telephone Encounter (Signed)
Routing to RMR- is it ok to send in something for him?

## 2016-11-02 ENCOUNTER — Encounter: Payer: Self-pay | Admitting: *Deleted

## 2016-11-02 NOTE — Telephone Encounter (Signed)
Tried to call- NA 

## 2016-11-02 NOTE — Telephone Encounter (Signed)
Spoke with the pts wife- Craig Baird, asked her to have him try the imodium- per package directions and let us know how he does. She agreed.

## 2016-11-15 ENCOUNTER — Ambulatory Visit (INDEPENDENT_AMBULATORY_CARE_PROVIDER_SITE_OTHER): Payer: Medicare Other | Admitting: Family Medicine

## 2016-11-15 ENCOUNTER — Encounter: Payer: Self-pay | Admitting: Family Medicine

## 2016-11-15 VITALS — BP 132/80 | HR 76 | Temp 97.4°F | Ht 67.0 in | Wt 127.0 lb

## 2016-11-15 DIAGNOSIS — R739 Hyperglycemia, unspecified: Secondary | ICD-10-CM

## 2016-11-15 DIAGNOSIS — Z125 Encounter for screening for malignant neoplasm of prostate: Secondary | ICD-10-CM

## 2016-11-15 DIAGNOSIS — R197 Diarrhea, unspecified: Secondary | ICD-10-CM | POA: Diagnosis not present

## 2016-11-15 DIAGNOSIS — E785 Hyperlipidemia, unspecified: Secondary | ICD-10-CM | POA: Diagnosis not present

## 2016-11-15 LAB — BAYER DCA HB A1C WAIVED: HB A1C: 5.8 % (ref <7.0)

## 2016-11-15 NOTE — Progress Notes (Signed)
BP 132/80   Pulse 76   Temp (!) 97.4 F (36.3 C) (Oral)   Ht 5\' 7"  (1.702 m)   Wt 127 lb (57.6 kg)   BMI 19.89 kg/m    Subjective:    Patient ID: Craig Baird, male    DOB: 12-Apr-1933, 83 y.o.   MRN: [id number]  HPI: Craig Baird is a 83 y.o. male presenting on 11/15/2016 for Frequent episodes of loose stools (patient would like to have stool cultures done and also any labwork he may need, he is not fasting)   HPI Frequent loose stools Patient has been having frequent loose stools over the past week and was seen at an urgent care and they recommended him to follow up with [place] and do a stool study but was still going on. He says he still has been having some his recent as yesterday but today it has been slightly better. He would like to go ahead and do stool studies to make sure nothing is going on. He denies any blood in his stool or abdominal pain or nausea or vomiting.  Hyperlipidemia Patient is coming in for recheck of his hyperlipidemia. The patient is currently taking diet control, we will check to see today how he is doing as it has been sometime since it was checked last. They deny any issues with myalgias or history of liver damage from it. They deny any focal numbness or weakness or chest pain.   Elevated blood sugar Patient has elevated blood sugar from his last blood draw and we will check an A1c today. He denies any urinary frequency or polyuria.  Relevant past medical, surgical, family and social history reviewed and updated as indicated. Interim medical history since our last visit reviewed. Allergies and medications reviewed and updated.  Review of Systems  Constitutional: Negative for chills and fever.  Eyes: Negative for discharge.  Respiratory: Negative for shortness of breath and wheezing.   Cardiovascular: Negative for chest pain and leg swelling.  Gastrointestinal: Positive for diarrhea. Negative for abdominal distention, abdominal pain, blood in stool,  constipation, nausea and vomiting.  Endocrine: Negative for polydipsia and polyuria.  Genitourinary: Negative for dysuria.  Musculoskeletal: Negative for back pain and gait problem.  Skin: Negative for rash.  All other systems reviewed and are negative.   Per HPI unless specifically indicated above        Objective:    BP 132/80   Pulse 76   Temp (!) 97.4 F (36.3 C) (Oral)   Ht 5\' 7"  (1.702 m)   Wt 127 lb (57.6 kg)   BMI 19.89 kg/m   Wt Readings from Last 3 Encounters:  11/15/16 127 lb (57.6 kg)  10/27/16 130 lb (59 kg)  09/29/16 127 lb 6 oz (57.8 kg)    Physical Exam  Constitutional: He is oriented to person, place, and time. He appears well-developed and well-nourished. No distress.  Eyes: Conjunctivae are normal. No scleral icterus.  Cardiovascular: Normal rate, regular rhythm, normal heart sounds and intact distal pulses.   No murmur heard. Pulmonary/Chest: Effort normal and breath sounds normal. No respiratory distress. He has no wheezes. He has no rales.  Abdominal: Soft. Bowel sounds are normal. He exhibits no distension. There is no tenderness. There is no rebound and no guarding.  Musculoskeletal: Normal range of motion. He exhibits no edema.  Neurological: He is alert and oriented to person, place, and time. Coordination normal.  Skin: Skin is warm and dry. No rash noted. He  is not diaphoretic.  Psychiatric: He has a normal mood and affect. His behavior is normal.  Nursing note and vitals reviewed.   Results for orders placed or performed during the hospital encounter of 10/27/16  CBC with Differential/Platelet  Result Value Ref Range   WBC 6.1 4.0 - 10.5 K/uL   RBC 3.96 (L) 4.22 - 5.81 MIL/uL   Hemoglobin 13.0 13.0 - 17.0 g/dL   HCT 37.8 (L) 39.0 - 52.0 %   MCV 95.5 78.0 - 100.0 fL   MCH 32.8 26.0 - 34.0 pg   MCHC 34.4 30.0 - 36.0 g/dL   RDW 13.5 11.5 - 15.5 %   Platelets 230 150 - 400 K/uL   Neutrophils Relative % 54 %   Neutro Abs 3.3 1.7 - 7.7  K/uL   Lymphocytes Relative 26 %   Lymphs Abs 1.6 0.7 - 4.0 K/uL   Monocytes Relative 14 %   Monocytes Absolute 0.9 0.1 - 1.0 K/uL   Eosinophils Relative 5 %   Eosinophils Absolute 0.3 0.0 - 0.7 K/uL   Basophils Relative 1 %   Basophils Absolute 0.0 0.0 - 0.1 K/uL  Comprehensive metabolic panel  Result Value Ref Range   Sodium 143 135 - 145 mmol/L   Potassium 4.2 3.5 - 5.1 mmol/L   Chloride 110 101 - 111 mmol/L   CO2 28 22 - 32 mmol/L   Glucose, Bld 118 (H) 65 - 99 mg/dL   BUN 14 6 - 20 mg/dL   Creatinine, Ser 0.98 0.61 - 1.24 mg/dL   Calcium 8.5 (L) 8.9 - 10.3 mg/dL   Total Protein 6.2 (L) 6.5 - 8.1 g/dL   Albumin 3.4 (L) 3.5 - 5.0 g/dL   AST 22 15 - 41 U/L   ALT 17 17 - 63 U/L   Alkaline Phosphatase 70 38 - 126 U/L   Total Bilirubin 0.4 0.3 - 1.2 mg/dL   GFR calc non Af Amer >60 >60 mL/min   GFR calc Af Amer >60 >60 mL/min   Anion gap 5 5 - 15      Assessment & Plan:   Problem List Items Addressed This Visit      Other   Hyperlipidemia   Relevant Orders   Lipid panel (Completed)    Other Visit Diagnoses    Diarrhea, unspecified type    -  Primary   Relevant Orders   Cdiff NAA+O+P+Stool Culture   Elevated blood sugar       Relevant Orders   Bayer DCA Hb A1c Waived (Completed)   Prostate cancer screening       Relevant Orders   PSA, total and free (Completed)       Follow up plan: Return in about 6 months (around 05/15/2017), or if symptoms worsen or fail to improve, for Recheck cholesterol.  Counseling provided for all of the vaccine components Orders Placed This Encounter  Procedures  . Cdiff NAA+O+P+Stool Culture  . Lipid panel  . PSA, total and free  . [facility] Hb A1c Waived    Caryl Pina, MD [facility] 11/15/2016, 9:09 AM

## 2016-11-16 ENCOUNTER — Other Ambulatory Visit: Payer: Medicare Other

## 2016-11-16 DIAGNOSIS — R197 Diarrhea, unspecified: Secondary | ICD-10-CM | POA: Diagnosis not present

## 2016-11-16 LAB — PSA, TOTAL AND FREE
PSA FREE PCT: 15.5 %
PSA, Free: 0.31 ng/mL
Prostate Specific Ag, Serum: 2 ng/mL (ref 0.0–4.0)

## 2016-11-16 LAB — LIPID PANEL
Chol/HDL Ratio: 4 ratio (ref 0.0–5.0)
Cholesterol, Total: 212 mg/dL — ABNORMAL HIGH (ref 100–199)
HDL: 53 mg/dL (ref >39)
LDL CALC: 136 mg/dL — AB (ref 0–99)
TRIGLYCERIDES: 113 mg/dL (ref 0–149)
VLDL Cholesterol Cal: 23 mg/dL (ref 5–40)

## 2016-11-17 ENCOUNTER — Telehealth: Payer: Self-pay | Admitting: Family Medicine

## 2016-11-17 ENCOUNTER — Ambulatory Visit: Payer: Self-pay | Admitting: Internal Medicine

## 2016-11-17 MED ORDER — METRONIDAZOLE 500 MG PO TABS: 500.0000 mg | ORAL_TABLET | Freq: Two times a day (BID) | ORAL | 0 refills | Status: DC

## 2016-11-17 NOTE — Telephone Encounter (Signed)
Pt aware and he will start med tonight

## 2016-11-17 NOTE — Telephone Encounter (Signed)
Patient came back positive for C. difficile colitis on stool study, sent Flagyl for him. Caryl Pina, MD [facility] 11/17/2016, 3:52 PM

## 2016-11-20 NOTE — Telephone Encounter (Signed)
I have been unable to reach the patient

## 2016-11-20 NOTE — Telephone Encounter (Signed)
Pt is aware of appt change  

## 2016-11-20 NOTE — Telephone Encounter (Signed)
I tried to call the patient several times to let him know we are moving his office time from 8am in the morning until 430 pm.

## 2016-11-21 ENCOUNTER — Telehealth: Payer: Self-pay

## 2016-11-21 ENCOUNTER — Encounter: Payer: Self-pay | Admitting: Internal Medicine

## 2016-11-21 ENCOUNTER — Ambulatory Visit (INDEPENDENT_AMBULATORY_CARE_PROVIDER_SITE_OTHER): Payer: Medicare Other | Admitting: Internal Medicine

## 2016-11-21 ENCOUNTER — Ambulatory Visit: Payer: Medicare Other | Admitting: Internal Medicine

## 2016-11-21 VITALS — BP 119/70 | HR 77 | Temp 97.8°F | Ht 67.0 in | Wt 127.0 lb

## 2016-11-21 DIAGNOSIS — R197 Diarrhea, unspecified: Secondary | ICD-10-CM

## 2016-11-21 DIAGNOSIS — A498 Other bacterial infections of unspecified site: Secondary | ICD-10-CM

## 2016-11-21 LAB — CDIFF NAA+O+P+STOOL CULTURE
E coli, Shiga toxin Assay: NEGATIVE
Toxigenic C. Difficile by PCR: POSITIVE — AB

## 2016-11-21 NOTE — Telephone Encounter (Signed)
Per Dr. Gala Romney, I called in the following prescription to [facility] Vancomycin 125 mg tablets to take one orally qid x 14 days with no refills.

## 2016-11-21 NOTE — Patient Instructions (Addendum)
Information on cleaning BR with CDiff  Stop Metronidazole  Vancomycin 125 mg orally 4x daily x 14 days  Stop probiotic   Office visit in 2 months

## 2016-11-21 NOTE — Progress Notes (Signed)
Primary Care Physician:  Dettinger, Fransisca Kaufmann, MD Primary Gastroenterologist:  Dr. Gala Romney  Pre-Procedure History & Physical: HPI:  Craig Baird is a 83 y.o. male here for follow-up of multiple GI complaints.. Recently found to have a positive C. difficile toxin assay. Start on metronidazole 500 mg twice a day. Having nausea with this medication. Recent onset diarrhea not improved.  Seen in the emergency room at my request a month ago for possible dehydration. EDP ordered a C. difficile but lab declined it because stool was formed. Past Medical History:  Diagnosis Date  . Allergy   . Anxiety   . Cataract   . Diverticulosis   . Hernia    L inguinal hernia  . Hiatal hernia   . Hyperlipidemia   . Sinusitis     Past Surgical History:  Procedure Laterality Date  . COLONOSCOPY N/A 09/15/2016   Procedure: COLONOSCOPY;  Surgeon: Daneil Dolin, MD;  [facility];  Service: Endoscopy;  Laterality: N/A;  1030   . ESOPHAGOGASTRODUODENOSCOPY N/A 09/15/2016   Procedure: ESOPHAGOGASTRODUODENOSCOPY (EGD);  Surgeon: Daneil Dolin, MD;  [facility];  Service: Endoscopy;  Laterality: N/A;  . THYROID CYST EXCISION  1973  . TONSILLECTOMY      Prior to Admission medications   Medication Sig Start Date End Date Taking? Authorizing Provider  aspirin EC 81 MG tablet Take 1 tablet (81 mg total) by mouth daily. Patient taking differently: Take 81 mg by mouth as needed.  04/01/14  Yes Stacks, Cletus Gash, MD  fluticasone (FLONASE) 50 MCG/ACT nasal spray Place 2 sprays into both nostrils daily. Patient taking differently: Place 2 sprays into both nostrils daily as needed for allergies.  08/14/16  Yes Martin, Mary-Margaret, FNP  metroNIDAZOLE (FLAGYL) 500 MG tablet Take 1 tablet (500 mg total) by mouth 2 (two) times daily. 11/17/16  Yes Dettinger, Fransisca Kaufmann, MD    Allergies as of 11/21/2016 - Review Complete 11/21/2016  Allergen Reaction Noted  . Cialis [tadalafil] Other (See  Comments) 08/12/2012  . Naproxen Other (See Comments) 12/16/2015  . Sulfa antibiotics Rash 08/12/2012    Family History  Problem Relation Age of Onset  . Hypertension Mother   . Stroke Mother   . Parkinson's disease Mother   . Hip fracture Mother   . Hypertension Brother 49  . Heart disease Brother   . Birth defects Father        spine  . Early death Sister   . Early death Sister   . Colon cancer Neg Hx   . Gastric cancer Neg Hx   . Esophageal cancer Neg Hx     Social History   Social History  . Marital status: Married    Spouse name: N/A  . Number of children: N/A  . Years of education: N/A   Occupational History  . Not on file.   Social History Main Topics  . Smoking status: Never Smoker  . Smokeless tobacco: Never Used  . Alcohol use No  . Drug use: No  . Sexual activity: Yes   Other Topics Concern  . Not on file   Social History Narrative  . No narrative on file    Review of Systems: See HPI, otherwise negative ROS  Physical Exam: BP 119/70   Pulse 77   Temp 97.8 F (36.6 C) (Oral)   Ht 5\' 7"  (1.702 m)   Wt 127 lb (57.6 kg)   BMI 19.89 kg/m  General:   Alert,  ,  pleasant and cooperative in NAD Neck:  Supple; no masses or thyromegaly. No significant cervical adenopathy. Lungs:  Clear throughout to auscultation.   No wheezes, crackles, or rhonchi. No acute distress. Heart:  Regular rate and rhythm; no murmurs, clicks, rubs,  or gallops. Abdomen: Non-distended, normal bowel sounds.  Soft and nontender without appreciable mass or hepatosplenomegaly.   Impression:  Nonspecific GI symptoms. Certainly, more diarrhea recently. Clostridium difficile positive. Exposure antibiotics earlier related to sinusitis. He is intolerant of metronidazole. He needs to be switched over to the new first-line therapy which is vancomycin  Recommendations:  Information on cleaning BR with CDiff  Stop Metronidazole  Vancomycin 125 mg orally 4x daily x 14 days  Stop  probiotic   Office visit in 2 months   Notice: This dictation was prepared with Dragon dictation along with smaller phrase technology. Any transcriptional errors that result from this process are unintentional and may not be corrected upon review.

## 2016-11-22 NOTE — Telephone Encounter (Signed)
Davina from [facility] called- left voicemail- pts copay for vanc is $190.00 and they cannot afford it. They want to know if there is anything else that can be sent in that will be cheaper?

## 2016-11-22 NOTE — Telephone Encounter (Signed)
I called and informed Davina at [facility], she said she would tell the pt.

## 2016-11-22 NOTE — Telephone Encounter (Signed)
I know of nothing else

## 2016-11-28 ENCOUNTER — Ambulatory Visit (INDEPENDENT_AMBULATORY_CARE_PROVIDER_SITE_OTHER): Payer: Medicare Other | Admitting: *Deleted

## 2016-11-28 ENCOUNTER — Encounter: Payer: Self-pay | Admitting: *Deleted

## 2016-11-28 ENCOUNTER — Encounter (INDEPENDENT_AMBULATORY_CARE_PROVIDER_SITE_OTHER): Payer: Self-pay

## 2016-11-28 VITALS — BP 126/74 | HR 75 | Ht 64.0 in | Wt 129.0 lb

## 2016-11-28 DIAGNOSIS — Z0000 Encounter for general adult medical examination without abnormal findings: Secondary | ICD-10-CM | POA: Diagnosis not present

## 2016-11-28 NOTE — Patient Instructions (Signed)
  Craig Baird , Thank you for taking time to come for your Medicare Wellness Visit. I appreciate your ongoing commitment to your health goals. Please review the following plan we discussed and let me know if I can assist you in the future.   These are the goals we discussed: Goals    . Exercise 150 minutes per week (moderate activity)       This is a list of the screening recommended for you and due dates:  Health Maintenance  Topic Date Due  . Flu Shot  05/15/2017*  . Tetanus Vaccine  11/11/2023  . Pneumonia vaccines  Completed  *Topic was postponed. The date shown is not the original due date.    Review Advance Directives and bring a signed/notarized copy to our office.  Flu shots will be available in the next few weeks

## 2016-12-01 ENCOUNTER — Other Ambulatory Visit: Payer: Self-pay | Admitting: Family Medicine

## 2016-12-01 NOTE — Progress Notes (Addendum)
Subjective:   Craig Baird is a 83 y.o. male who presents for a subsequent Medicare Annual Wellness Visit. Craig Baird lives at home with his wife. They have 2 adult children (adopted), 3 grandchildren, and 3 great grandchildren. 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 He is a member of the [employer] and is active with them.  Review of Systems  Reports that health is about the same as last year.   Cardiac Risk Factors include: advanced age (>74men, >9 women);male gender;dyslipidemia  Other systems negative today.   Objective:    Today's Vitals   11/28/16 1555  BP: 126/74  Pulse: 75  Weight: 129 lb (58.5 kg)  Height: 5\' 4"  (1.626 m)   Body mass index is 22.14 kg/m.  Current Medications (verified) Outpatient Encounter Prescriptions as of 11/28/2016  Medication Sig  . vancomycin (VANCOCIN) 250 MG capsule Take 250 mg by mouth 4 (four) times daily.  Marland Kitchen aspirin EC 81 MG tablet Take 1 tablet (81 mg total) by mouth daily. (Patient taking differently: Take 81 mg by mouth as needed. )  . fluticasone (FLONASE) 50 MCG/ACT nasal spray Place 2 sprays into both nostrils daily. (Patient taking differently: Place 2 sprays into both nostrils daily as needed for allergies. )  . [DISCONTINUED] doxycycline (VIBRA-TABS) 100 MG tablet Take 1 tablet (100 mg total) by mouth 2 (two) times daily. (Patient not taking: Reported on 08/31/2016)  . [DISCONTINUED] metroNIDAZOLE (FLAGYL) 500 MG tablet Take 1 tablet (500 mg total) by mouth 2 (two) times daily.   No facility-administered encounter medications on file as of 11/28/2016.     Allergies (verified) Cialis [tadalafil]; Naproxen; and Sulfa antibiotics   History: Past Medical History:  Diagnosis Date  . Allergy   . Anxiety   . Cataract   . Diverticulosis   . Hernia    L inguinal hernia  . Hiatal hernia   . Hyperlipidemia   . Sinusitis    Past Surgical History:  Procedure Laterality Date  . COLONOSCOPY  N/A 09/15/2016   Procedure: COLONOSCOPY;  Surgeon: Daneil Dolin, MD;  [facility];  Service: Endoscopy;  Laterality: N/A;  1030   . ESOPHAGOGASTRODUODENOSCOPY N/A 09/15/2016   Procedure: ESOPHAGOGASTRODUODENOSCOPY (EGD);  Surgeon: Daneil Dolin, MD;  [facility];  Service: Endoscopy;  Laterality: N/A;  . THYROID CYST EXCISION  1973  . TONSILLECTOMY     Family History  Problem Relation Age of Onset  . Hypertension Mother   . Stroke Mother   . Parkinson's disease Mother   . Hip fracture Mother   . Hypertension Brother 16  . Heart disease Brother   . Birth defects Father        spine  . Early death Sister   . Early death Sister   . Colon cancer Neg Hx   . Gastric cancer Neg Hx   . Esophageal cancer Neg Hx    Social History   Occupational History  . Not on file.   Social History Main Topics  . Smoking status: Never Smoker  . Smokeless tobacco: Never Used  . Alcohol use No  . Drug use: No  . Sexual activity: Yes   Tobacco Counseling Counseling given: Not Answered   Activities of Daily Living In your present state of health, do you have any difficulty performing the following activities: 11/28/2016  Hearing? N  Vision? N  Difficulty concentrating or making decisions? N  Walking or climbing stairs? N  Dressing or bathing? N  Doing errands, shopping? N  Preparing Food and eating ? N  Using the Toilet? N  In the past six months, have you accidently leaked urine? N  Do you have problems with loss of bowel control? N  Managing your Medications? N  Managing your Finances? N  Housekeeping or managing your Housekeeping? N  Some recent data might be hidden    Immunizations and Health Maintenance Immunization History  Administered Date(s) Administered  . Influenza Split 11/21/2012  . Influenza, High Dose Seasonal PF 11/13/2013  . Influenza,inj,Quad PF,6+ Mos 12/09/2015  . Pneumococcal Conjugate-13 07/08/2014  . Pneumococcal Polysaccharide-23  08/21/2007, 02/11/2011  . Pneumococcal-Unspecified 11/29/2014  . Tdap 11/23/2010   There are no preventive care reminders to display for this patient.  Patient Care Team: Dettinger, Fransisca Kaufmann, MD as PCP - General (Family Medicine) Lavonna Monarch, MD as Consulting Physician (Dermatology) Gala Romney Cristopher Estimable, MD as Consulting Physician (Gastroenterology)  No hospitalizations, ER visits or surgeries this past year    Assessment:   This is a routine wellness examination for Craig Baird.   Hearing/Vision screen No deficits noted during visit. Eye exam current.  Dietary issues and exercise activities discussed: Current Exercise Habits: Home exercise routine (Stays busy around the house), Type of exercise: walking, Time (Minutes): 10, Frequency (Times/Week): 7, Weekly Exercise (Minutes/Week): 70, Intensity: Mild, Exercise limited by: None identified  Goals    . Exercise 150 minutes per week (moderate activity)      Depression Screen PHQ 2/9 Scores 11/28/2016 11/15/2016 09/29/2016 08/22/2016  PHQ - 2 Score 0 1 0 0  PHQ- 9 Score - - - -    Fall Risk Fall Risk  11/28/2016 11/15/2016 09/29/2016 08/22/2016 08/14/2016  Falls in the past year? No No No No No  Number falls in past yr: - - - - -  Injury with Fall? - - - - -  Comment - - - - -  Follow up - - - - -    Cognitive Function: MMSE - Mini Mental State Exam 11/28/2016 11/23/2015 07/08/2014  Orientation to time 5 5 5   Orientation to Place 5 5 5   Registration 3 3 3   Attention/ Calculation 4 5 0  Recall 3 2 3   Language- name 2 objects 2 2 2   Language- repeat 1 1 0  Language- follow 3 step command 3 3 3   Language- read & follow direction 1 1 1   Write a sentence 1 1 1   Copy design 0 1 1  Total score 28 29 -    Normal exam    Screening Tests Health Maintenance  Topic Date Due  . INFLUENZA VACCINE  05/15/2017 (Originally 10/11/2016)  . TETANUS/TDAP  11/11/2023  . PNA vac Low Risk Adult  Completed        Plan:    Flu shots will be available  soon Review advance directives and bring a signed/notarized copy to our office Continue to stay active Moderate exercise 150 minutes per week  I have personally reviewed and noted the following in the patient's chart:   . Medical and social history . Use of alcohol, tobacco or illicit drugs  . Current medications and supplements . Functional ability and status . Nutritional status . Physical activity . Advanced directives . List of other physicians . Hospitalizations, surgeries, and ER visits in previous 12 months . Vitals . Screenings to include cognitive, depression, and falls . Referrals and appointments  In addition, I have reviewed and discussed with patient certain  preventive protocols, quality metrics, and best practice recommendations. A written personalized care plan for preventive services as well as general preventive health recommendations were provided to patient.    Chong Sicilian, RN  12/01/2016    I have reviewed and agree with the above AWV documentation.  Claretta Fraise, M.D.

## 2016-12-04 ENCOUNTER — Ambulatory Visit: Payer: Self-pay | Admitting: Nurse Practitioner

## 2016-12-05 NOTE — Progress Notes (Signed)
Done. Thanks, WS 

## 2016-12-07 ENCOUNTER — Telehealth: Payer: Self-pay | Admitting: Internal Medicine

## 2016-12-07 NOTE — Telephone Encounter (Signed)
Pt's wife Phineas Semen) called asking to speak with RMR. I told her he wasn't in the office today and I could take a message. She has questions about her husband's medications if someone could call her back at [phone]

## 2016-12-07 NOTE — Telephone Encounter (Signed)
I spoke with [facility] and answered all her questions. She wanted to know how the pt will know when he is better and will he have to repeat the stool tests. I advised her that the pt will no longer have watery diarrhea and he shouldn't have to repeat the stool tests unless he continues to have symptoms. She said she understood.

## 2016-12-18 ENCOUNTER — Encounter: Payer: Self-pay | Admitting: Internal Medicine

## 2016-12-21 ENCOUNTER — Other Ambulatory Visit: Payer: Self-pay | Admitting: Physician Assistant

## 2016-12-21 DIAGNOSIS — M9904 Segmental and somatic dysfunction of sacral region: Secondary | ICD-10-CM | POA: Diagnosis not present

## 2016-12-21 DIAGNOSIS — M9905 Segmental and somatic dysfunction of pelvic region: Secondary | ICD-10-CM | POA: Diagnosis not present

## 2016-12-21 DIAGNOSIS — M9903 Segmental and somatic dysfunction of lumbar region: Secondary | ICD-10-CM | POA: Diagnosis not present

## 2016-12-21 DIAGNOSIS — M5431 Sciatica, right side: Secondary | ICD-10-CM | POA: Diagnosis not present

## 2016-12-21 DIAGNOSIS — F411 Generalized anxiety disorder: Secondary | ICD-10-CM

## 2016-12-25 DIAGNOSIS — M9904 Segmental and somatic dysfunction of sacral region: Secondary | ICD-10-CM | POA: Diagnosis not present

## 2016-12-25 DIAGNOSIS — M9903 Segmental and somatic dysfunction of lumbar region: Secondary | ICD-10-CM | POA: Diagnosis not present

## 2016-12-25 DIAGNOSIS — M5431 Sciatica, right side: Secondary | ICD-10-CM | POA: Diagnosis not present

## 2016-12-25 DIAGNOSIS — M9905 Segmental and somatic dysfunction of pelvic region: Secondary | ICD-10-CM | POA: Diagnosis not present

## 2016-12-27 DIAGNOSIS — M9903 Segmental and somatic dysfunction of lumbar region: Secondary | ICD-10-CM | POA: Diagnosis not present

## 2016-12-27 DIAGNOSIS — M5431 Sciatica, right side: Secondary | ICD-10-CM | POA: Diagnosis not present

## 2016-12-27 DIAGNOSIS — M9904 Segmental and somatic dysfunction of sacral region: Secondary | ICD-10-CM | POA: Diagnosis not present

## 2016-12-27 DIAGNOSIS — M9905 Segmental and somatic dysfunction of pelvic region: Secondary | ICD-10-CM | POA: Diagnosis not present

## 2017-01-01 DIAGNOSIS — M9903 Segmental and somatic dysfunction of lumbar region: Secondary | ICD-10-CM | POA: Diagnosis not present

## 2017-01-01 DIAGNOSIS — M9905 Segmental and somatic dysfunction of pelvic region: Secondary | ICD-10-CM | POA: Diagnosis not present

## 2017-01-01 DIAGNOSIS — M9904 Segmental and somatic dysfunction of sacral region: Secondary | ICD-10-CM | POA: Diagnosis not present

## 2017-01-01 DIAGNOSIS — M5431 Sciatica, right side: Secondary | ICD-10-CM | POA: Diagnosis not present

## 2017-01-12 ENCOUNTER — Ambulatory Visit (INDEPENDENT_AMBULATORY_CARE_PROVIDER_SITE_OTHER): Payer: Medicare Other | Admitting: Internal Medicine

## 2017-01-12 ENCOUNTER — Encounter: Payer: Self-pay | Admitting: Internal Medicine

## 2017-01-12 VITALS — BP 137/65 | HR 75 | Temp 97.2°F | Ht 64.0 in | Wt 130.2 lb

## 2017-01-12 DIAGNOSIS — A498 Other bacterial infections of unspecified site: Secondary | ICD-10-CM

## 2017-01-12 DIAGNOSIS — B9689 Other specified bacterial agents as the cause of diseases classified elsewhere: Secondary | ICD-10-CM

## 2017-01-12 DIAGNOSIS — R634 Abnormal weight loss: Secondary | ICD-10-CM | POA: Diagnosis not present

## 2017-01-12 NOTE — Progress Notes (Signed)
Primary Care Physician:  Dettinger, Fransisca Kaufmann, MD Primary Gastroenterologist:  Dr. Gala Romney  Pre-Procedure History & Physical: HPI:  Craig Baird is a 83 y.o. male here for follow-up of a C. Difficile associated diarrhea/abdominal pain. He took 2 weeks of vancomycin. He is very happy today. He's gained 3 pounds. He states he is "cured". Has normal bowel function; no abdominal; pain feels like his "old self". He Clorox cleaned the bathroom as instructed.  Past Medical History:  Diagnosis Date  . Allergy   . Anxiety   . Cataract   . Diverticulosis   . Hernia    L inguinal hernia  . Hiatal hernia   . Hyperlipidemia   . Sinusitis     Past Surgical History:  Procedure Laterality Date  . COLONOSCOPY N/A 09/15/2016   Procedure: COLONOSCOPY;  Surgeon: Daneil Dolin, MD;  [facility];  Service: Endoscopy;  Laterality: N/A;  1030   . ESOPHAGOGASTRODUODENOSCOPY N/A 09/15/2016   Procedure: ESOPHAGOGASTRODUODENOSCOPY (EGD);  Surgeon: Daneil Dolin, MD;  [facility];  Service: Endoscopy;  Laterality: N/A;  . THYROID CYST EXCISION  1973  . TONSILLECTOMY      Prior to Admission medications   Medication Sig Start Date End Date Taking? Authorizing Provider  aspirin EC 81 MG tablet Take 1 tablet (81 mg total) by mouth daily. Patient taking differently: Take 81 mg by mouth as needed.  04/01/14  Yes Stacks, Cletus Gash, MD  fluticasone (FLONASE) 50 MCG/ACT nasal spray Place 2 sprays into both nostrils daily. Patient taking differently: Place 2 sprays into both nostrils daily as needed for allergies.  08/14/16  Yes Martin, Mary-Margaret, FNP  sertraline (ZOLOFT) 25 MG tablet Take 25 mg by mouth daily. As needed. Does not take daily   Yes [provider]    Allergies as of 01/12/2017 - Review Complete 01/12/2017  Allergen Reaction Noted  . Cialis [tadalafil] Other (See Comments) 08/12/2012  . Naproxen Other (See Comments) 12/16/2015  . Sulfa antibiotics Rash  08/12/2012    Family History  Problem Relation Age of Onset  . Hypertension Mother   . Stroke Mother   . Parkinson's disease Mother   . Hip fracture Mother   . Hypertension Brother 22  . Heart disease Brother   . Birth defects Father        spine  . Early death Sister   . Early death Sister   . Colon cancer Neg Hx   . Gastric cancer Neg Hx   . Esophageal cancer Neg Hx     Social History   Social History  . Marital status: Married    Spouse name: N/A  . Number of children: N/A  . Years of education: N/A   Occupational History  . Not on file.   Social History Main Topics  . Smoking status: Never Smoker  . Smokeless tobacco: Never Used  . Alcohol use No  . Drug use: No  . Sexual activity: Yes   Other Topics Concern  . Not on file   Social History Narrative  . No narrative on file    Review of Systems: See HPI, otherwise negative ROS  Physical Exam: BP 137/65   Pulse 75   Temp (!) 97.2 F (36.2 C) (Oral)   Ht 5\' 4"  (1.626 m)   Wt 130 lb 3.2 oz (59.1 kg)   BMI 22.35 kg/m  General:   Alert,  Well-developed, well-nourished, pleasant and cooperative in NAD Heart:  Regular rate  and rhythm; no murmurs, clicks, rubs,  or gallops. Abdomen: Non-distended, normal bowel sounds.  Soft and nontender without appreciable mass or hepatosplenomegaly.  Pulses:  Normal pulses noted. Extremities:  Without clubbing or edema.  Impression:  Pleasant 83 year old gentleman history of C. Difficile infection now dramatically improved after 2 week course of vancomycin. His symptoms have resolved. He's chlorine bleach cleaning his bathroom. We talked about strict hygiene measures at home. Even with these measures, we discussed the possibility of recurrence of infection  Recommendations:  At this time, no further GI intervention follow-up warranted. However,  patient is to watch for signs /symptoms of relapse. He is to call me at any time with any concerns..         Notice:  This dictation was prepared with Dragon dictation along with smaller phrase technology. Any transcriptional errors that result from this process are unintentional and may not be corrected upon review.

## 2017-01-12 NOTE — Patient Instructions (Addendum)
Monitor for any recurrent symptoms -  Discussed the possibility of relapse  Follow-up with me as needed

## 2017-03-07 ENCOUNTER — Ambulatory Visit (INDEPENDENT_AMBULATORY_CARE_PROVIDER_SITE_OTHER): Payer: Medicare Other | Admitting: Family Medicine

## 2017-03-07 ENCOUNTER — Encounter: Payer: Self-pay | Admitting: Family Medicine

## 2017-03-07 VITALS — BP 131/69 | HR 81 | Temp 97.8°F | Ht 64.0 in | Wt 133.0 lb

## 2017-03-07 DIAGNOSIS — M26609 Unspecified temporomandibular joint disorder, unspecified side: Secondary | ICD-10-CM

## 2017-03-07 DIAGNOSIS — L309 Dermatitis, unspecified: Secondary | ICD-10-CM | POA: Diagnosis not present

## 2017-03-07 MED ORDER — CETIRIZINE HCL 10 MG PO TABS: 5.0000 mg | ORAL_TABLET | Freq: Every day | ORAL | 1 refills | Status: DC

## 2017-03-07 NOTE — Patient Instructions (Addendum)
I value your feedback and appreciate you entrusting [place] with your care.  If you get a survey, I would appreciate your taking the time to let us know what your experience was like.  I think nightly an antihistamine called cetirizine.  You may take 1/2-1 tablet every night at bedtime for itching.  As we discussed, this can cause sedation and call you to be sleepy.  If your insurance does not pay for this medication, you can obtain a short supply of over-the-counter to see if this will help with your itching.  Continue to follow-up with dermatology as scheduled.  I think that the jaw pain you are experiencing is something called TMJ.  This is a self-limited condition.  You may consider using over-the-counter naproxen or ibuprofen if you are having discomfort.  Temporomandibular Joint Syndrome Temporomandibular joint (TMJ) syndrome is a condition that affects the joints between your jaw and your skull. The TMJs are located near your ears and allow your jaw to open and close. These joints and the nearby muscles are involved in all movements of the jaw. People with TMJ syndrome have pain in the area of these joints and muscles. Chewing, biting, or other movements of the jaw can be difficult or painful. TMJ syndrome can be caused by various things. In many cases, the condition is mild and goes away within a few weeks. For some people, the condition can become a long-term problem. What are the causes? Possible causes of TMJ syndrome include:  Grinding your teeth or clenching your jaw. Some people do this when they are under stress.  Arthritis.  Injury to the jaw.  Head or neck injury.  Teeth or dentures that are not aligned well.  In some cases, the cause of TMJ syndrome may not be known. What are the signs or symptoms? The most common symptom is an aching pain on the side of the head in the area of the TMJ. Other symptoms may include:  Pain when moving your jaw, such as when chewing or  biting.  Being unable to open your jaw all the way.  Making a clicking sound when you open your mouth.  Headache.  Earache.  Neck or shoulder pain.  How is this diagnosed? Diagnosis can usually be made based on your symptoms, your medical history, and a physical exam. Your health care provider may check the range of motion of your jaw. Imaging tests, such as X-rays or an MRI, are sometimes done. You may need to see your dentist to determine if your teeth and jaw are lined up correctly. How is this treated? TMJ syndrome often goes away on its own. If treatment is needed, the options may include:  Eating soft foods and applying ice or heat.  Medicines to relieve pain or inflammation.  Medicines to relax the muscles.  A splint, bite plate, or mouthpiece to prevent teeth grinding or jaw clenching.  Relaxation techniques or counseling to help reduce stress.  Transcutaneous electrical nerve stimulation (TENS). This helps to relieve pain by applying an electrical current through the skin.  Acupuncture. This is sometimes helpful to relieve pain.  Jaw surgery. This is rarely needed.  Follow these instructions at home:  Take medicines only as directed by your health care provider.  Eat a soft diet if you are having trouble chewing.  Apply ice to the painful area. ? Put ice in a plastic bag. ? Place a towel between your skin and the bag. ? Leave the ice on for  20 minutes, 2-3 times a day.  Apply a warm compress to the painful area as directed.  Massage your jaw area and perform any jaw stretching exercises as recommended by your health care provider.  If you were given a mouthpiece or bite plate, wear it as directed.  Avoid foods that require a lot of chewing. Do not chew gum.  Keep all follow-up visits as directed by your health care provider. This is important. Contact a health care provider if:  You are having trouble eating.  You have new or worsening symptoms. Get  help right away if:  Your jaw locks open or closed. This information is not intended to replace advice given to you by your health care provider. Make sure you discuss any questions you have with your health care provider. Document Released: 11/22/2000 Document Revised: 10/28/2015 Document Reviewed: 10/02/2013 Elsevier Interactive Patient Education  [employer].

## 2017-03-07 NOTE — Progress Notes (Signed)
Subjective: CC: rash PCP: Dettinger, Fransisca Kaufmann, MD WCB:[facility] Craig Baird is a 83 y.o. male presenting to clinic today for:  1. Rash Patient reports that he has had a rash in various places on his body for the last year and a half.  He actually sees Dr. Denna Haggard with dermatology for this.  He is currently on triamcinolone cream which she notes does not improve rash significantly.  He is wondering if there is some type of blood tests are alternative therapy that he may try to reduce recurrence of this itchy rash.  Currently, rash is localized to the anterior chest and posterior neck.  He notes associated skin thickening along the left side of his posterior neck.  No recent illnesses.  No fevers, chills, purulence from lesions.  He is not tried any oral antihistamines for the symptoms.  2.  Right ear pain Patient notes onset of right ear pain particularly when he is chewing over the last 2-3 weeks.  He denies dental pain, citing that he uses dentures which are several years old and he thinks may be wearing out.  Denies changes in hearing.  No dizziness.  No otic discharge.  ROS: Per HPI  Allergies  Allergen Reactions  . Cialis [Tadalafil] Other (See Comments)    Unknown   . Naproxen Other (See Comments)    Dry mouth, dizziness  . Sulfa Antibiotics Rash   Past Medical History:  Diagnosis Date  . Allergy   . Anxiety   . Cataract   . Diverticulosis   . Hernia    L inguinal hernia  . Hiatal hernia   . Hyperlipidemia   . Sinusitis     Current Outpatient Medications:  .  aspirin EC 81 MG tablet, Take 1 tablet (81 mg total) by mouth daily. (Patient taking differently: Take 81 mg by mouth as needed. ), Disp: 100 tablet, Rfl: 11 .  fluticasone (FLONASE) 50 MCG/ACT nasal spray, Place 2 sprays into both nostrils daily. (Patient taking differently: Place 2 sprays into both nostrils daily as needed for allergies. ), Disp: 16 g, Rfl: 6 .  sertraline (ZOLOFT) 25 MG tablet, Take 25 mg by mouth  daily. As needed. Does not take daily, Disp: , Rfl:  Social History   Socioeconomic History  . Marital status: Married    Spouse name: Not on file  . Number of children: Not on file  . Years of education: Not on file  . Highest education level: Not on file  Social Needs  . Financial resource strain: Not on file  . Food insecurity - worry: Not on file  . Food insecurity - inability: Not on file  . Transportation needs - medical: Not on file  . Transportation needs - non-medical: Not on file  Occupational History  . Not on file  Tobacco Use  . Smoking status: Never Smoker  . Smokeless tobacco: Never Used  Substance and Sexual Activity  . Alcohol use: No  . Drug use: No  . Sexual activity: Yes  Other Topics Concern  . Not on file  Social History Narrative  . Not on file   Family History  Problem Relation Age of Onset  . Hypertension Mother   . Stroke Mother   . Parkinson's disease Mother   . Hip fracture Mother   . Hypertension Brother 37  . Heart disease Brother   . Birth defects Father        spine  . Early death Sister   . Early  death Sister   . Colon cancer Neg Hx   . Gastric cancer Neg Hx   . Esophageal cancer Neg Hx     Objective: Office vital signs reviewed. BP 131/69   Pulse 81   Temp 97.8 F (36.6 Craig) (Oral)   Ht 5\' 4"  (1.626 m)   Wt 133 lb (60.3 kg)   BMI 22.83 kg/m   Physical Examination:  General: Awake, alert, well nourished, well appearing, No acute distress HEENT: Normal    Neck: No masses palpated. No lymphadenopathy    Ears: Tympanic membranes intact, normal light reflex, no erythema, no bulging    Eyes: PERRLA, extraocular membranes intact, sclera white    Nose: nasal turbinates moist, no nasal discharge    Throat: moist mucus membranes MSK: Patient with palpable click on the right side at the temporomandibular junction.  He is able to open and close his mouth fully.  No tenderness to palpation over TMJ. Skin: Patient with a blanching,  erythematous, maculopapular rash along his chest and posterior neck.  He does have associated thickening of the skin particularly over the left posterior neck.  Multiple healing excoriations appreciated.  No secondary infection appreciated.  Assessment/ Plan: 83 y.o. male   1. TMJ dysfunction I recommended that he see his dentist for new dentures.  He is likely experiencing TMJ because of worn out, ill fitting dentures.  Recommended oral anti-inflammatory like naproxen p.o. twice daily as needed discomfort.  Nothing to suggest a temporal arteritis or other more significant diagnosis.  Home care instructions were reviewed and a handout was provided.  2. Dermatitis Currently being treated by dermatology.  Per his report, he has not had any oral antihistamines.  I have prescribed him Zyrtec 5-10 mg p.o. nightly to see if this will help with the pruritus associated with the dermatitis.  Caution sedation.  Continue routine skin care regimen with emphasis on good hydration and use of mild, non-scented soaps.  He will follow-up with PCP in the next couple of weeks if this is not improving.    Meds ordered this encounter  Medications  . cetirizine (ZYRTEC) 10 MG tablet    Sig: Take 0.5-1 tablets (5-10 mg total) by mouth daily.    Dispense:  30 tablet    Refill:  [place], DO [place] [phone]

## 2017-03-14 ENCOUNTER — Ambulatory Visit: Payer: Medicare Other | Admitting: Family Medicine

## 2017-04-18 ENCOUNTER — Ambulatory Visit (INDEPENDENT_AMBULATORY_CARE_PROVIDER_SITE_OTHER): Payer: Medicare Other | Admitting: Family Medicine

## 2017-04-18 ENCOUNTER — Encounter: Payer: Self-pay | Admitting: Family Medicine

## 2017-04-18 VITALS — BP 134/76 | HR 75 | Temp 96.7°F | Ht 64.0 in | Wt 134.0 lb

## 2017-04-18 DIAGNOSIS — M26621 Arthralgia of right temporomandibular joint: Secondary | ICD-10-CM | POA: Diagnosis not present

## 2017-04-18 DIAGNOSIS — M1712 Unilateral primary osteoarthritis, left knee: Secondary | ICD-10-CM | POA: Diagnosis not present

## 2017-04-18 MED ORDER — METHYLPREDNISOLONE ACETATE 80 MG/ML IJ SUSP
80.0000 mg | Freq: Once | INTRAMUSCULAR | Status: AC
  Administered 2017-04-18: 80 mg via INTRAMUSCULAR

## 2017-04-18 MED ORDER — MELOXICAM 15 MG PO TABS: 15.0000 mg | ORAL_TABLET | Freq: Every day | ORAL | 0 refills | Status: DC

## 2017-04-18 NOTE — Progress Notes (Addendum)
BP 134/76   Pulse 75   Temp (!) 96.7 F (35.9 C) (Oral)   Ht 5\' 4"  (1.626 m)   Wt 134 lb (60.8 kg)   BMI 23.00 kg/m    Subjective:    Patient ID: Craig Baird, male    DOB: February 01, 1934, 84 y.o.   MRN: [id number]  HPI: Craig Baird is a 84 y.o. male presenting on 04/18/2017 for Jaw pain, right side, hurts to chew   HPI Right jaw pain Has been having right jaw pain and popping and clicking this been going on for the past couple weeks.  He was seen for this previously and was recommended to go see an orthopedic he says that it hurts on that right side of his jaw all the way up through his ear and all the way down through his jaw and has been that way.  He has not taken anything for it but just wants to know if he needs an x-ray or something needs to be done for it.  He does have upper and lower dentures and has not had them examined by a dentist in quite some time.  He was recommended to go see a dentist for this and take anti-inflammatories.  Left knee arthritis Patient has chronic left knee arthritis and is flared up on him recently.  He has had injections before and would like to have another one.  He denies any swelling or fevers or chills.  He denies any popping or catching or giving way  Relevant past medical, surgical, family and social history reviewed and updated as indicated. Interim medical history since our last visit reviewed. Allergies and medications reviewed and updated.  Review of Systems  Constitutional: Negative for chills and fever.  Respiratory: Negative for shortness of breath and wheezing.   Cardiovascular: Negative for chest pain and leg swelling.  Musculoskeletal: Positive for arthralgias. Negative for back pain, gait problem and joint swelling.  Skin: Negative for rash.  All other systems reviewed and are negative.   Per HPI unless specifically indicated above   Allergies as of 04/18/2017      Reactions   Cialis [tadalafil] Other (See Comments)   Unknown    Naproxen Other (See Comments)   Dry mouth, dizziness   Sulfa Antibiotics Rash      Medication List        Accurate as of 04/18/17  5:20 PM. Always use your most recent med list.          aspirin EC 81 MG tablet Take 1 tablet (81 mg total) by mouth daily.   cetirizine 10 MG tablet Commonly known as:  ZYRTEC Take 0.5-1 tablets (5-10 mg total) by mouth daily.   fluticasone 50 MCG/ACT nasal spray Commonly known as:  FLONASE Place 2 sprays into both nostrils daily.   triamcinolone cream 0.1 % Commonly known as:  KENALOG Apply 1 application topically 2 (two) times daily.          Objective:    BP 134/76   Pulse 75   Temp (!) 96.7 F (35.9 C) (Oral)   Ht 5\' 4"  (1.626 m)   Wt 134 lb (60.8 kg)   BMI 23.00 kg/m   Wt Readings from Last 3 Encounters:  04/18/17 134 lb (60.8 kg)  03/07/17 133 lb (60.3 kg)  01/12/17 130 lb 3.2 oz (59.1 kg)    Physical Exam  Constitutional: He is oriented to person, place, and time. He appears well-developed and well-nourished. No distress.  Eyes: Conjunctivae are normal. No scleral icterus.  Cardiovascular: Normal rate, regular rhythm, normal heart sounds and intact distal pulses.  No murmur heard. Pulmonary/Chest: Effort normal and breath sounds normal. No respiratory distress. He has no wheezes.  Musculoskeletal: Normal range of motion. He exhibits tenderness (Medial and lateral knee tenderness, consistent with arthritis, negative McMurray and other ligament testing). He exhibits no edema.  Right-sided TMJ popping and clicking and pain over the joint  Neurological: He is alert and oriented to person, place, and time. Coordination normal.  Skin: Skin is warm and dry. No rash noted. He is not diaphoretic.  Psychiatric: He has a normal mood and affect. His behavior is normal.  Nursing note and vitals reviewed.   Knee injection: Consent form signed. Risk factors of bleeding and infection discussed with patient and patient is  agreeable towards injection. Patient prepped with Betadine. Lateral approach towards injection used. Injected 80 mg of Depo-Medrol and 1 mL of 2% lidocaine. Patient tolerated procedure well and no side effects from noted. Minimal to no bleeding. Simple bandage applied after.     Assessment & Plan:   Problem List Items Addressed This Visit    None    Visit Diagnoses    Primary osteoarthritis of left knee    -  Primary   Relevant Medications   methylPREDNISolone acetate (DEPO-MEDROL) injection 80 mg   meloxicam (MOBIC) 15 MG tablet   TMJ tenderness, right       Recommended to see a dentist and use anti-inflammatory medication   Relevant Medications   meloxicam (MOBIC) 15 MG tablet     Patient had not tried anti-inflammatories and it was recommended for him to try them and give Depo-Medrol for his knee.  Follow up plan: Return if symptoms worsen or fail to improve.  Counseling provided for all of the vaccine components No orders of the defined types were placed in this encounter.   Caryl Pina, MD [facility] 04/18/2017, 5:20 PM

## 2017-04-19 NOTE — Addendum Note (Signed)
Addended by: Michaela Corner on: 04/19/2017 12:11 PM   Modules accepted: Orders

## 2017-05-14 IMAGING — DX DG SHOULDER 2+V*R*
3 series · 3 of 3 positions shown · non-contrast
Comparison: None in PACs

CLINICAL DATA: Unable to raise the right arm common no known injury

EXAM:
RIGHT SHOULDER - 2+ VIEW

[shoulder ap]
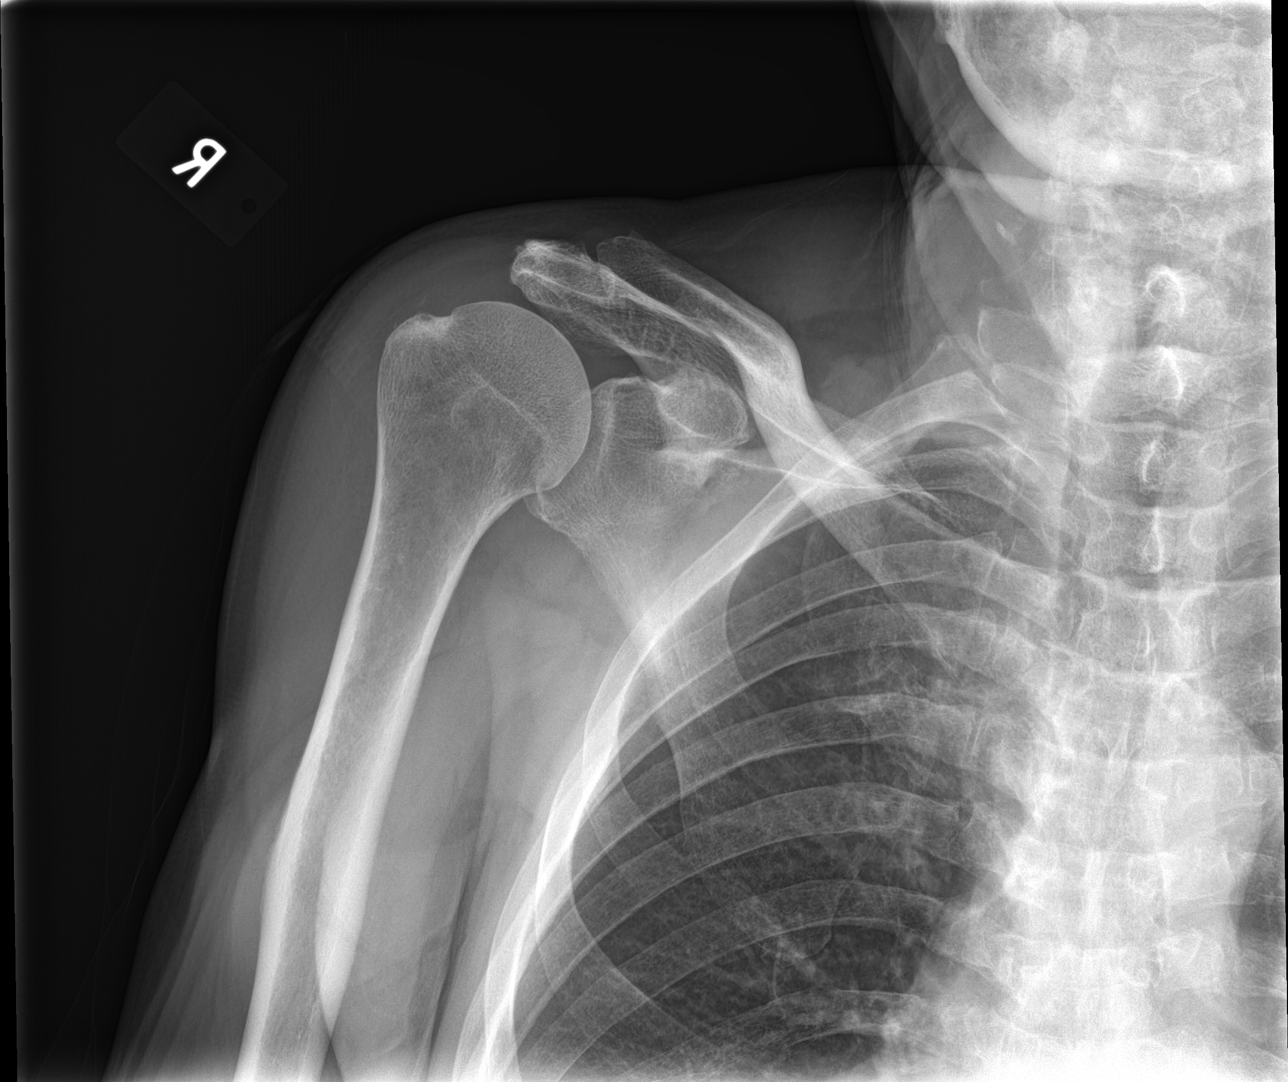

[shoulder obl]
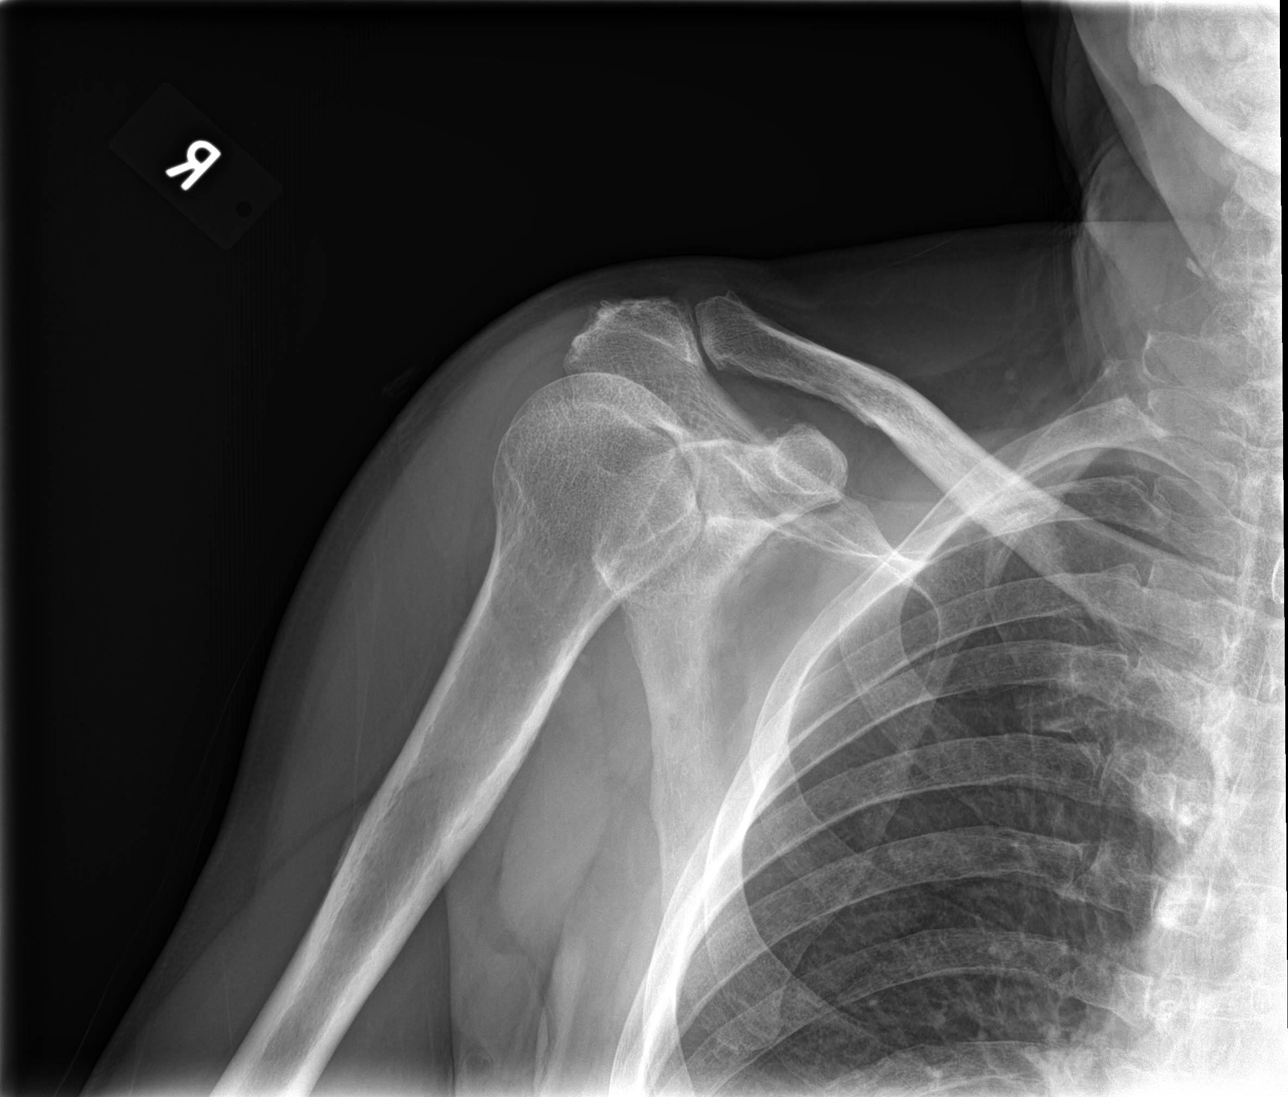

[shoulder axial]
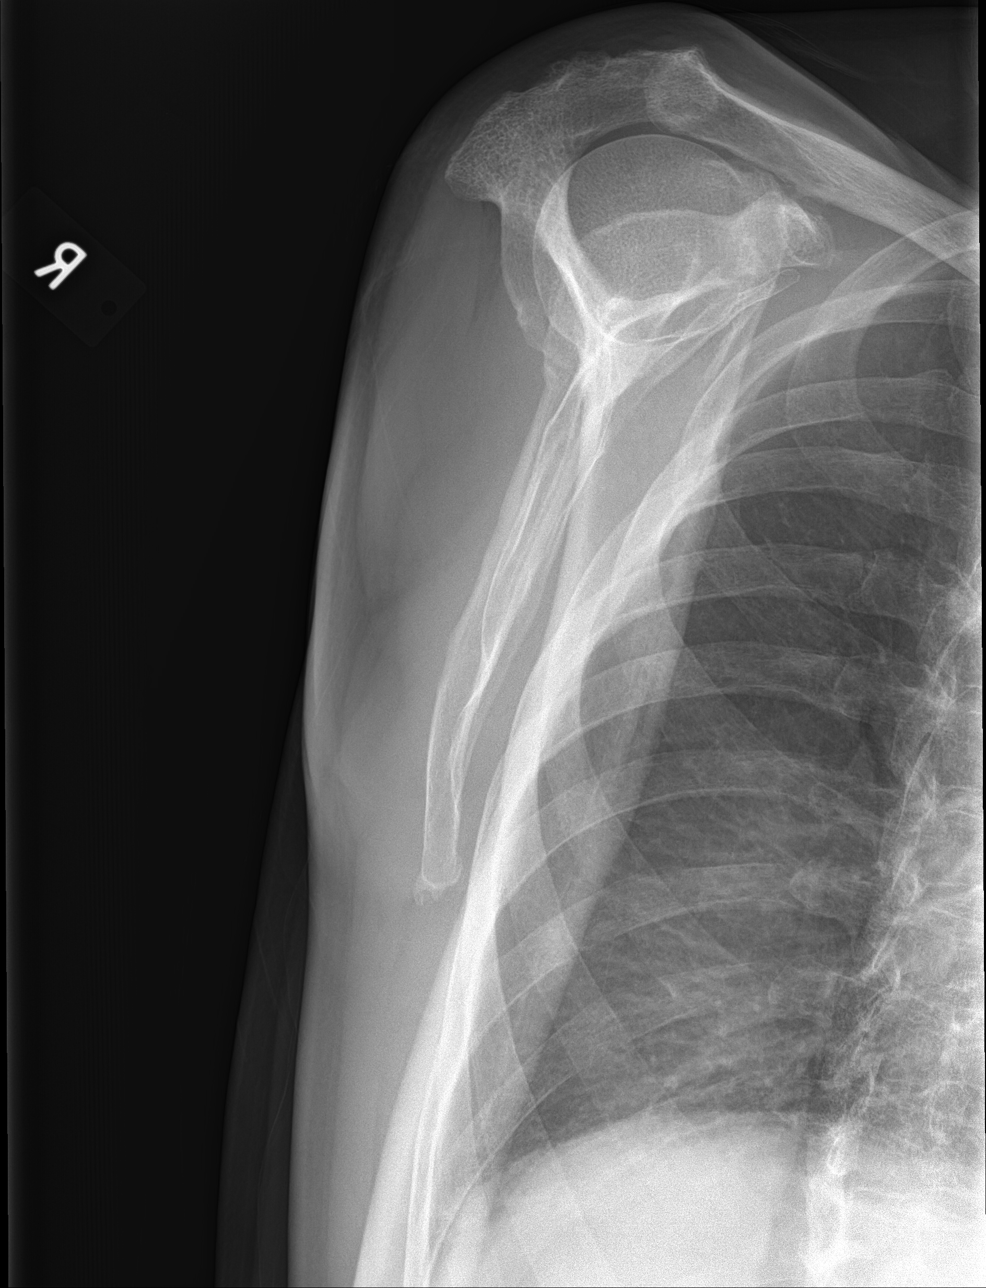

[3 of 3 positions shown; findings below may reference images not displayed]

FINDINGS: The bones are subjectively adequately mineralized. The AC joint
space is preserved. The articular surfaces of the humeral head and
bony glenoid appear normal. There is mild narrowing of the AC joint
with small marginal osteophytes. The subacromial subdeltoid space
appears mildly narrowed. A small subacromial spur is suspected. The
observed portions of the right clavicle and upper right ribs are
normal.
IMPRESSION: Mild degenerative change centered on the AC joint an on the
subacromial subdeltoid space. The findings may be impacting the
rotator cuff. Further evaluation with MRI would be useful if the
patient can undergo the procedure.

## 2017-06-03 ENCOUNTER — Other Ambulatory Visit: Payer: Self-pay | Admitting: Pediatrics

## 2017-06-03 DIAGNOSIS — J3089 Other allergic rhinitis: Secondary | ICD-10-CM

## 2017-06-04 ENCOUNTER — Ambulatory Visit (INDEPENDENT_AMBULATORY_CARE_PROVIDER_SITE_OTHER): Payer: Medicare Other | Admitting: Family Medicine

## 2017-06-04 ENCOUNTER — Encounter: Payer: Self-pay | Admitting: Family Medicine

## 2017-06-04 VITALS — BP 127/74 | HR 82 | Temp 99.3°F | Ht 64.0 in | Wt 139.0 lb

## 2017-06-04 DIAGNOSIS — J0140 Acute pansinusitis, unspecified: Secondary | ICD-10-CM | POA: Diagnosis not present

## 2017-06-04 MED ORDER — AMOXICILLIN-POT CLAVULANATE 875-125 MG PO TABS: 1.0000 | ORAL_TABLET | Freq: Two times a day (BID) | ORAL | 0 refills | Status: DC

## 2017-06-04 NOTE — Patient Instructions (Signed)
I have sent in amoxicillin free to take twice a day for the next 10 days.  Continue the sinus rinses.  Make sure that you are using the Flonase 2 sprays in each nostril once daily.  It may take several weeks before you notice a great deal of difference in your sinuses from the Flonase.  If your symptoms worsen, please seek immediate medical attention.   Sinusitis, Adult Sinusitis is soreness and inflammation of your sinuses. Sinuses are hollow spaces in the bones around your face. They are located:  Around your eyes.  In the middle of your forehead.  Behind your nose.  In your cheekbones.  Your sinuses and nasal passages are lined with a stringy fluid (mucus). Mucus normally drains out of your sinuses. When your nasal tissues get inflamed or swollen, the mucus can get trapped or blocked so air cannot flow through your sinuses. This lets bacteria, viruses, and funguses grow, and that leads to infection. Follow these instructions at home: Medicines  Take, use, or apply over-the-counter and prescription medicines only as told by your doctor. These may include nasal sprays.  If you were prescribed an antibiotic medicine, take it as told by your doctor. Do not stop taking the antibiotic even if you start to feel better. Hydrate and Humidify  Drink enough water to keep your pee (urine) clear or pale yellow.  Use a cool mist humidifier to keep the humidity level in your home above 50%.  Breathe in steam for 10-15 minutes, 3-4 times a day or as told by your doctor. You can do this in the bathroom while a hot shower is running.  Try not to spend time in cool or dry air. Rest  Rest as much as possible.  Sleep with your head raised (elevated).  Make sure to get enough sleep each night. General instructions  Put a warm, moist washcloth on your face 3-4 times a day or as told by your doctor. This will help with discomfort.  Wash your hands often with soap and water. If there is no soap  and water, use hand sanitizer.  Do not smoke. Avoid being around people who are smoking (secondhand smoke).  Keep all follow-up visits as told by your doctor. This is important. Contact a doctor if:  You have a fever.  Your symptoms get worse.  Your symptoms do not get better within 10 days. Get help right away if:  You have a very bad headache.  You cannot stop throwing up (vomiting).  You have pain or swelling around your face or eyes.  You have trouble seeing.  You feel confused.  Your neck is stiff.  You have trouble breathing. This information is not intended to replace advice given to you by your health care provider. Make sure you discuss any questions you have with your health care provider. Document Released: 08/16/2007 Document Revised: 10/24/2015 Document Reviewed: 12/23/2014 Elsevier Interactive Patient Education  [employer].

## 2017-06-04 NOTE — Progress Notes (Signed)
Subjective: CC: Sinus issues PCP: Dettinger, Fransisca Kaufmann, MD [facility] C Oo is a 84 y.o. male presenting to clinic today for:  1.  Sinus symptoms Patient reports that he has had sinus symptoms for the last 3 weeks.  He describes rhinorrhea, sinus pressure.  He denies fevers, chills, nausea, vomiting, headache.  He has been using Tylenol and sinus rinses with little improvement in symptoms.  He is also been using over-the-counter Zyrtec.  He notes that he just started his Flonase today.  Denies cough, congestion, shortness of breath.   ROS: Per HPI  Allergies  Allergen Reactions  . Cialis [Tadalafil] Other (See Comments)    Unknown   . Naproxen Other (See Comments)    Dry mouth, dizziness  . Sulfa Antibiotics Rash   Past Medical History:  Diagnosis Date  . Allergy   . Anxiety   . Cataract   . Diverticulosis   . Hernia    L inguinal hernia  . Hiatal hernia   . Hyperlipidemia   . Sinusitis     Current Outpatient Medications:  .  aspirin EC 81 MG tablet, Take 1 tablet (81 mg total) by mouth daily. (Patient taking differently: Take 81 mg by mouth as needed. ), Disp: 100 tablet, Rfl: 11 .  fluticasone (FLONASE) 50 MCG/ACT nasal spray, Place 2 sprays into both nostrils daily as needed for allergies., Disp: 48 g, Rfl: 1 Social History   Socioeconomic History  . Marital status: Married    Spouse name: Not on file  . Number of children: Not on file  . Years of education: Not on file  . Highest education level: Not on file  Occupational History  . Not on file  Social Needs  . Financial resource strain: Not on file  . Food insecurity:    Worry: Not on file    Inability: Not on file  . Transportation needs:    Medical: Not on file    Non-medical: Not on file  Tobacco Use  . Smoking status: Never Smoker  . Smokeless tobacco: Never Used  Substance and Sexual Activity  . Alcohol use: No  . Drug use: No  . Sexual activity: Yes  Lifestyle  . Physical activity:   Days per week: Not on file    Minutes per session: Not on file  . Stress: Not on file  Relationships  . Social connections:    Talks on phone: Not on file    Gets together: Not on file    Attends religious service: Not on file    Active member of club or organization: Not on file    Attends meetings of clubs or organizations: Not on file    Relationship status: Not on file  . Intimate partner violence:    Fear of current or ex partner: Not on file    Emotionally abused: Not on file    Physically abused: Not on file    Forced sexual activity: Not on file  Other Topics Concern  . Not on file  Social History Narrative  . Not on file   Family History  Problem Relation Age of Onset  . Hypertension Mother   . Stroke Mother   . Parkinson's disease Mother   . Hip fracture Mother   . Hypertension Brother 41  . Heart disease Brother   . Birth defects Father        spine  . Early death Sister   . Early death Sister   . Colon cancer  Neg Hx   . Gastric cancer Neg Hx   . Esophageal cancer Neg Hx     Objective: Office vital signs reviewed. BP 127/74   Pulse 82   Temp 99.3 F (37.4 C) (Oral)   Ht 5\' 4"  (1.626 m)   Wt 139 lb (63 kg)   BMI 23.86 kg/m   Physical Examination:  General: Awake, alert, well nourished, No acute distress HEENT: Normal    Neck: No masses palpated. No lymphadenopathy    Ears: Tympanic membranes intact, normal light reflex, no erythema, no bulging    Eyes: PERRLA, extraocular membranes intact, sclera white    Nose: nasal turbinates moist, clear nasal discharge    Throat: moist mucus membranes, no erythema, no tonsillar exudate.  Airway is patent Cardio: regular rate and rhythm, S1S2 heard, no murmurs appreciated Pulm: clear to auscultation bilaterally, no wheezes, rhonchi or rales; normal work of breathing on room air  Assessment/ Plan: 84 y.o. male   1. Acute non-recurrent pansinusitis So far has failed over-the-counter regimens.  He does have a  low-grade fever here in office.  Given duration of symptoms, will empirically started Augmentin p.o. twice daily for the next 10 days.  Home care instructions were reviewed.  Strict return precautions and reasons for emergent evaluation in the emergency department review with patient.  They voiced understanding and will follow-up as needed.   Meds ordered this encounter  Medications  . amoxicillin-clavulanate (AUGMENTIN) 875-125 MG tablet    Sig: Take 1 tablet by mouth 2 (two) times daily.    Dispense:  20 tablet    Refill:  [place], DO [place] [phone]

## 2017-06-27 ENCOUNTER — Other Ambulatory Visit: Payer: Self-pay | Admitting: Family Medicine

## 2017-10-05 IMAGING — DX DG CHEST 2V
2 series · 2 of 2 positions shown · non-contrast
Comparison: None.

CLINICAL DATA: Productive cough.

EXAM:
CHEST  2 VIEW

[chest pa]
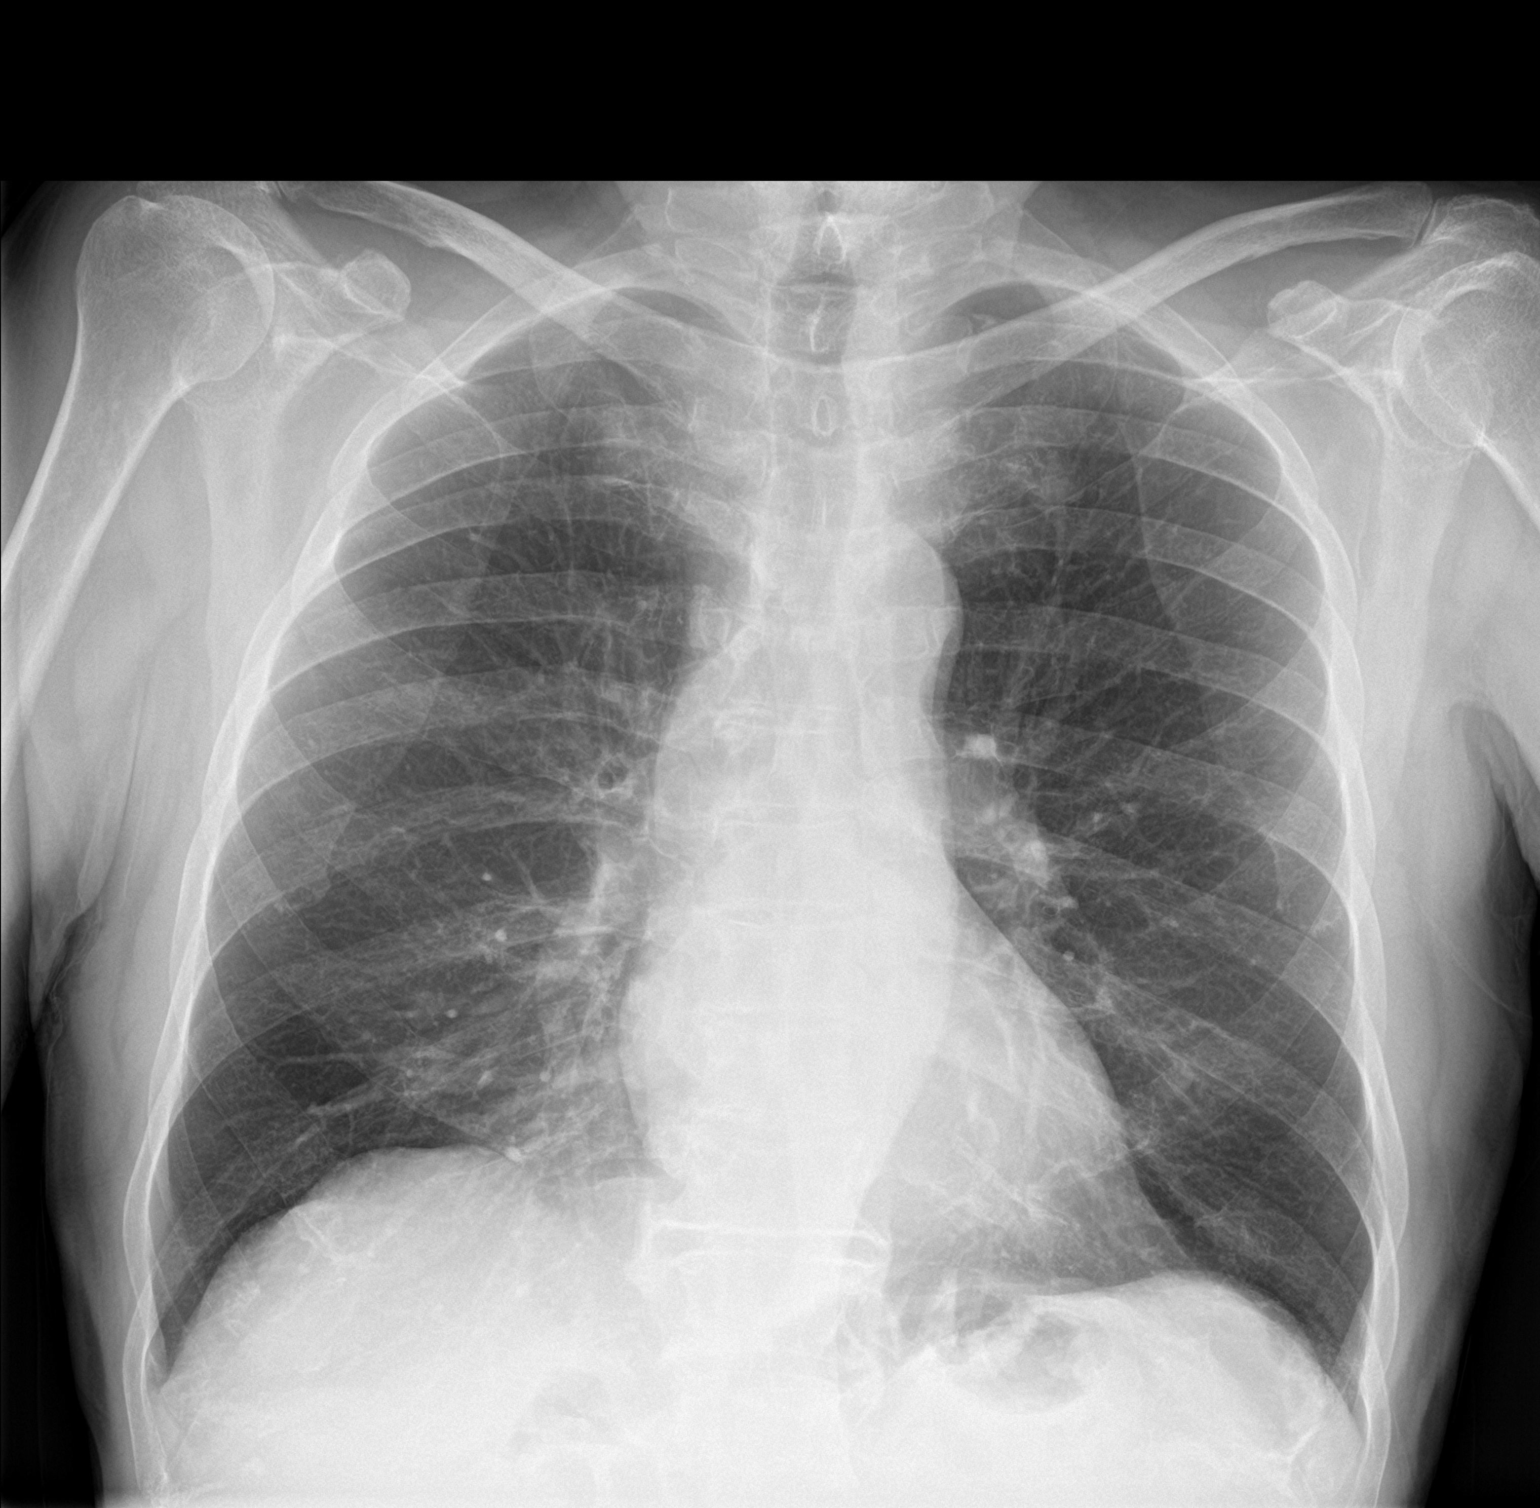

[chest lat]
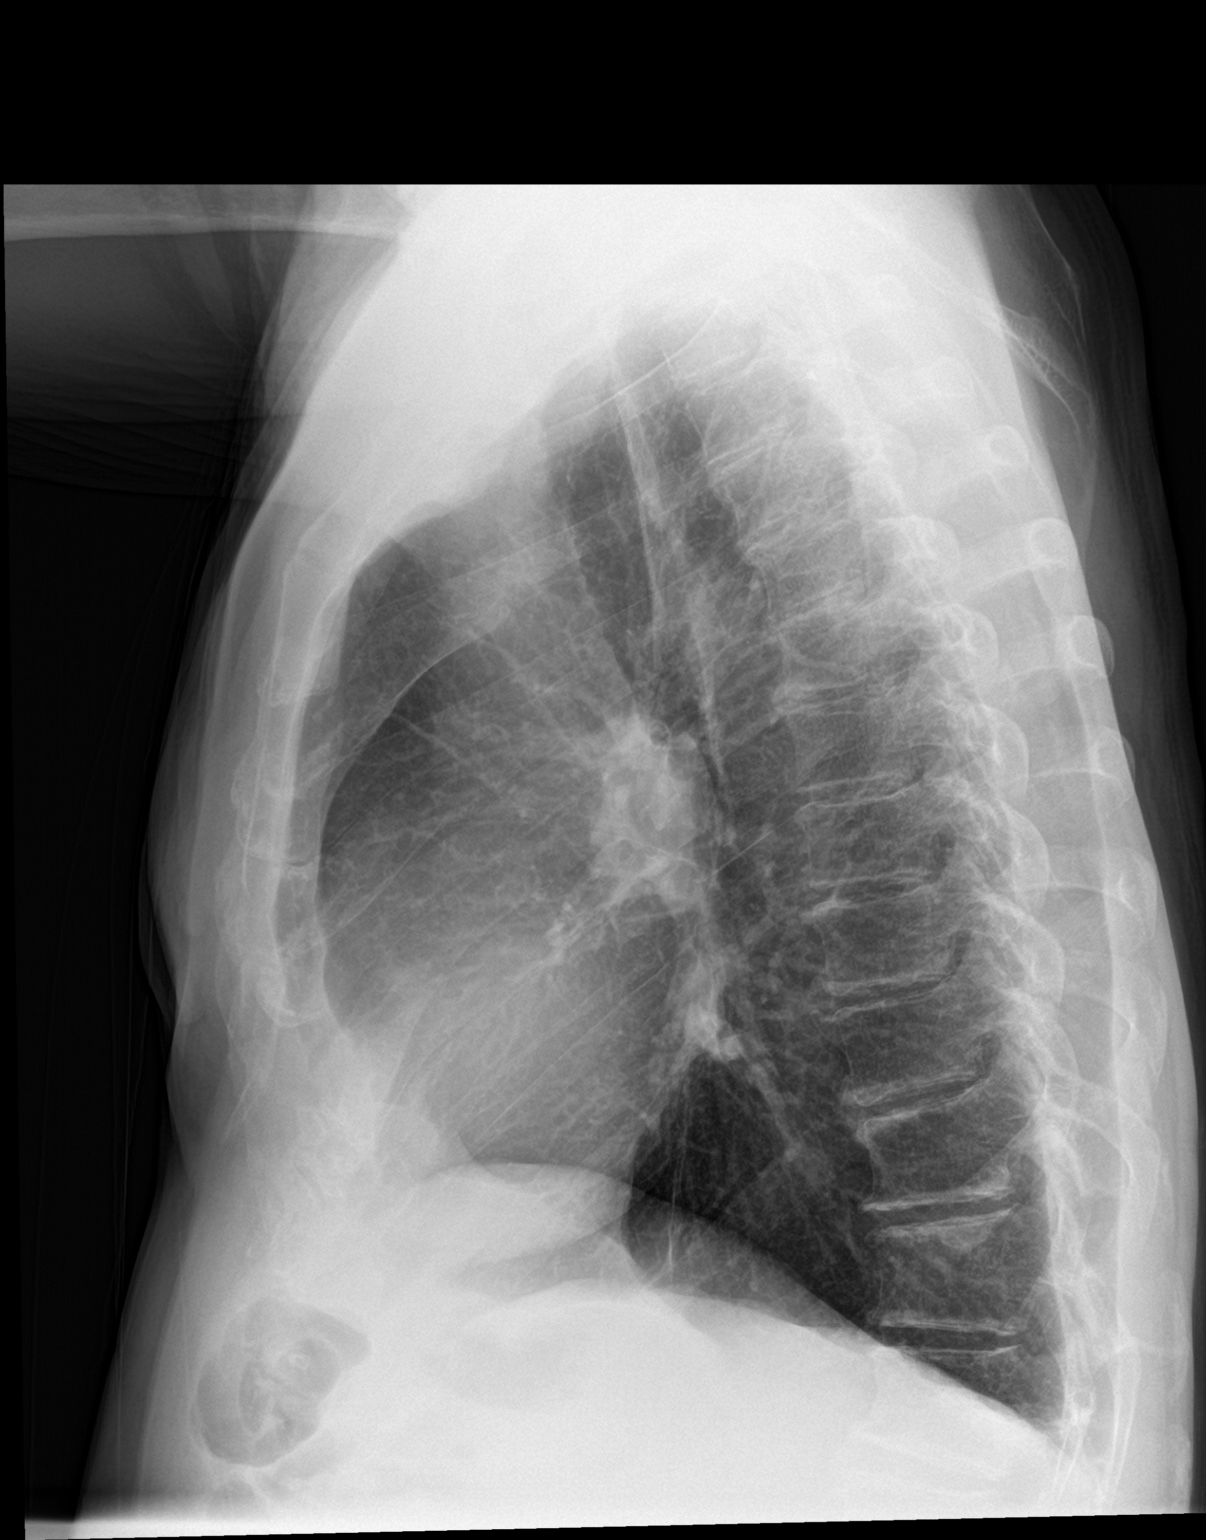

[2 of 2 positions shown; findings below may reference images not displayed]

FINDINGS: The heart size and mediastinal contours are within normal limits.
Both lungs are clear. No pneumothorax or pleural effusion is noted.
The visualized skeletal structures are unremarkable.
IMPRESSION: No active cardiopulmonary disease.

## 2017-10-11 ENCOUNTER — Other Ambulatory Visit: Payer: Self-pay | Admitting: Family Medicine

## 2017-10-16 ENCOUNTER — Telehealth: Payer: Self-pay | Admitting: Family Medicine

## 2017-10-25 DIAGNOSIS — H40033 Anatomical narrow angle, bilateral: Secondary | ICD-10-CM | POA: Diagnosis not present

## 2017-10-25 DIAGNOSIS — H04123 Dry eye syndrome of bilateral lacrimal glands: Secondary | ICD-10-CM | POA: Diagnosis not present

## 2017-11-08 ENCOUNTER — Encounter: Payer: Self-pay | Admitting: Family Medicine

## 2017-11-08 ENCOUNTER — Ambulatory Visit (INDEPENDENT_AMBULATORY_CARE_PROVIDER_SITE_OTHER): Payer: Medicare Other | Admitting: Family Medicine

## 2017-11-08 VITALS — BP 138/65 | HR 66 | Temp 97.0°F | Ht 64.0 in | Wt 133.0 lb

## 2017-11-08 DIAGNOSIS — Z131 Encounter for screening for diabetes mellitus: Secondary | ICD-10-CM

## 2017-11-08 DIAGNOSIS — Z125 Encounter for screening for malignant neoplasm of prostate: Secondary | ICD-10-CM

## 2017-11-08 DIAGNOSIS — E785 Hyperlipidemia, unspecified: Secondary | ICD-10-CM

## 2017-11-08 DIAGNOSIS — M1712 Unilateral primary osteoarthritis, left knee: Secondary | ICD-10-CM

## 2017-11-08 DIAGNOSIS — F411 Generalized anxiety disorder: Secondary | ICD-10-CM

## 2017-11-08 MED ORDER — ESCITALOPRAM OXALATE 10 MG PO TABS: 10.0000 mg | ORAL_TABLET | Freq: Every day | ORAL | 1 refills | Status: DC

## 2017-11-08 MED ORDER — METHYLPREDNISOLONE ACETATE 80 MG/ML IJ SUSP
80.0000 mg | Freq: Once | INTRAMUSCULAR | Status: AC
  Administered 2017-11-08: 80 mg via INTRAMUSCULAR

## 2017-11-08 NOTE — Progress Notes (Signed)
BP 138/65   Pulse 66   Temp (!) 97 F (36.1 C) (Oral)   Ht _0  (1.626 m)   Wt 133 lb (60.3 kg)   BMI 22.83 kg/m    Subjective:    Patient ID: Craig Baird, male    DOB: Jul 28, 1933, 84 y.o.   MRN: [id number]  HPI: Craig Baird is a 84 y.o. male presenting on 11/08/2017 for Hyperlipidemia (check up )   HPI Hyperlipidemia Patient is coming in for recheck of his hyperlipidemia. The patient is currently taking no medication we are monitoring for now. They deny any issues with myalgias or history of liver damage from it. They deny any focal numbness or weakness or chest pain.   Anxiety recheck Patient is coming in for anxiety recheck.  He tried Zoloft 25 mg and does not feel like it did anything to him.  We discussed going up on the dose that he said he rather just go to something completely different because he feels like this 1 did not do anything form.  He denies any depression or suicidal ideations.  He is not feeling down with less energy but he just gets very anxious and sometimes has panic attacks and would like to change of his medication to see if it can help. Depression screen [facility] 2/9 11/08/2017 04/18/2017 11/28/2016 11/15/2016 09/29/2016  Decreased Interest 0 0 0 0 0  Down, Depressed, Hopeless 0 0 0 1 0  PHQ - 2 Score 0 0 0 1 0  Altered sleeping - - - - -  Tired, decreased energy - - - - -  Change in appetite - - - - -  Feeling bad or failure about yourself  - - - - -  Trouble concentrating - - - - -  Moving slowly or fidgety/restless - - - - -  Suicidal thoughts - - - - -  PHQ-9 Score - - - - -  Difficult doing work/chores - - - - -    Knee osteoarthritis Patient comes in with complaints of left knee osteoarthritis bothering him again.  It has been 6 months since his last injection and he would like to get one again.  Relevant past medical, surgical, family and social history reviewed and updated as indicated. Interim medical history since our last visit  reviewed. Allergies and medications reviewed and updated.  Review of Systems  Constitutional: Negative for chills and fever.  Eyes: Negative for visual disturbance.  Respiratory: Negative for shortness of breath and wheezing.   Cardiovascular: Negative for chest pain and leg swelling.  Musculoskeletal: Positive for arthralgias. Negative for back pain, gait problem and joint swelling.  Skin: Negative for color change and rash.  All other systems reviewed and are negative.   Per HPI unless specifically indicated above   Allergies as of 11/08/2017      Reactions   Cialis [tadalafil] Other (See Comments)   Unknown    Naproxen Other (See Comments)   Dry mouth, dizziness   Sulfa Antibiotics Rash      Medication List        Accurate as of 11/08/17  9:44 AM. Always use your most recent med list.          aspirin EC 81 MG tablet Take 1 tablet (81 mg total) by mouth daily.   escitalopram 10 MG tablet Commonly known as:  LEXAPRO Take 1 tablet (10 mg total) by mouth daily.   fluticasone 50 MCG/ACT nasal spray Commonly known as:  FLONASE Place 2 sprays into both nostrils daily as needed for allergies.   sertraline 25 MG tablet Commonly known as:  ZOLOFT Take 1 tablet (25 mg total) by mouth daily. Need to be seen before next refill          Objective:    BP 138/65   Pulse 66   Temp (!) 97 F (36.1 C) (Oral)   Ht _0  (1.626 m)   Wt 133 lb (60.3 kg)   BMI 22.83 kg/m   Wt Readings from Last 3 Encounters:  11/08/17 133 lb (60.3 kg)  06/04/17 139 lb (63 kg)  04/18/17 134 lb (60.8 kg)    Physical Exam  Constitutional: He is oriented to person, place, and time. He appears well-developed and well-nourished. No distress.  HENT:  Nose: Nose normal.  Mouth/Throat: Oropharynx is clear and moist. No oropharyngeal exudate.  Eyes: Pupils are equal, round, and reactive to light. Conjunctivae and EOM are normal. No scleral icterus.  Neck: Neck supple. No thyromegaly present.   Cardiovascular: Normal rate, regular rhythm, normal heart sounds and intact distal pulses.  No murmur heard. Pulmonary/Chest: Effort normal and breath sounds normal. No respiratory distress. He has no wheezes.  Abdominal: Soft. Bowel sounds are normal. He exhibits no distension. There is no tenderness. There is no guarding.  Musculoskeletal: Normal range of motion. He exhibits tenderness (Mild lateral tenderness, no loss of range of motion or instability). He exhibits no edema.  Lymphadenopathy:    He has no cervical adenopathy.  Neurological: He is alert and oriented to person, place, and time. Coordination normal.  Skin: Skin is warm and dry. No rash noted. He is not diaphoretic.  Psychiatric: He has a normal mood and affect. His behavior is normal.  Nursing note and vitals reviewed.   Knee injection: Consent form signed. Risk factors of bleeding and infection discussed with patient and patient is agreeable towards injection. Patient prepped with Betadine. Lateral approach towards injection used. Injected 80 mg of Depo-Medrol and 1 mL of 2% lidocaine. Patient tolerated procedure well and no side effects from noted. Minimal to no bleeding. Simple bandage applied after.     Assessment & Plan:   Problem List Items Addressed This Visit      Other   GAD (generalized anxiety disorder)   Relevant Medications   escitalopram (LEXAPRO) 10 MG tablet   Other Relevant Orders   CBC with Differential/Platelet   Hyperlipidemia - Primary   Relevant Orders   Lipid panel    Other Visit Diagnoses    Prostate cancer screening       Relevant Orders   PSA, total and free   Diabetes mellitus screening       Relevant Orders   CMP14+EGFR   Primary osteoarthritis of left knee       Relevant Medications   methylPREDNISolone acetate (DEPO-MEDROL) injection 80 mg (Start on 11/08/2017 10:00 AM)       Follow up plan: Return in about 6 months (around 05/11/2018), or if symptoms worsen or fail to improve,  for Hyperlipidemia recheck and anxiety.  Counseling provided for all of the vaccine components Orders Placed This Encounter  Procedures  . CBC with Differential/Platelet  . CMP14+EGFR  . Lipid panel  . PSA, total and free    Craig Pina, MD [facility] 11/08/2017, 9:44 AM

## 2017-11-09 LAB — CMP14+EGFR
ALT: 16 IU/L (ref 0–44)
AST: 20 IU/L (ref 0–40)
Albumin/Globulin Ratio: 1.8 (ref 1.2–2.2)
Albumin: 4.4 g/dL (ref 3.5–4.7)
Alkaline Phosphatase: 91 IU/L (ref 39–117)
BUN/Creatinine Ratio: 18 (ref 10–24)
BUN: 13 mg/dL (ref 8–27)
Bilirubin Total: 0.5 mg/dL (ref 0.0–1.2)
CALCIUM: 9 mg/dL (ref 8.6–10.2)
CO2: 26 mmol/L (ref 20–29)
CREATININE: 0.74 mg/dL — AB (ref 0.76–1.27)
Chloride: 104 mmol/L (ref 96–106)
GFR, EST AFRICAN AMERICAN: 100 mL/min/{1.73_m2} (ref >59)
GFR, EST NON AFRICAN AMERICAN: 87 mL/min/{1.73_m2} (ref >59)
GLOBULIN, TOTAL: 2.4 g/dL (ref 1.5–4.5)
Glucose: 99 mg/dL (ref 65–99)
Potassium: 4.2 mmol/L (ref 3.5–5.2)
SODIUM: 145 mmol/L — AB (ref 134–144)
TOTAL PROTEIN: 6.8 g/dL (ref 6.0–8.5)

## 2017-11-09 LAB — PSA, TOTAL AND FREE
PROSTATE SPECIFIC AG, SERUM: 2.4 ng/mL (ref 0.0–4.0)
PSA FREE PCT: 13.3 %
PSA FREE: 0.32 ng/mL

## 2017-11-09 LAB — CBC WITH DIFFERENTIAL/PLATELET
BASOS: 1 %
Basophils Absolute: 0.1 10*3/uL (ref 0.0–0.2)
EOS (ABSOLUTE): 0.3 10*3/uL (ref 0.0–0.4)
EOS: 4 %
Hematocrit: 41.4 % (ref 37.5–51.0)
Hemoglobin: 14.4 g/dL (ref 13.0–17.7)
IMMATURE GRANS (ABS): 0 10*3/uL (ref 0.0–0.1)
IMMATURE GRANULOCYTES: 0 %
LYMPHS: 24 %
Lymphocytes Absolute: 1.6 10*3/uL (ref 0.7–3.1)
MCH: 32.7 pg (ref 26.6–33.0)
MCHC: 34.8 g/dL (ref 31.5–35.7)
MCV: 94 fL (ref 79–97)
Monocytes Absolute: 0.6 10*3/uL (ref 0.1–0.9)
Monocytes: 8 %
NEUTROS PCT: 63 %
Neutrophils Absolute: 4.3 10*3/uL (ref 1.4–7.0)
PLATELETS: 260 10*3/uL (ref 150–450)
RBC: 4.4 x10E6/uL (ref 4.14–5.80)
RDW: 12.8 % (ref 12.3–15.4)
WBC: 6.9 10*3/uL (ref 3.4–10.8)

## 2017-11-09 LAB — LIPID PANEL
CHOL/HDL RATIO: 3.8 ratio (ref 0.0–5.0)
Cholesterol, Total: 212 mg/dL — ABNORMAL HIGH (ref 100–199)
HDL: 56 mg/dL (ref >39)
LDL CALC: 142 mg/dL — AB (ref 0–99)
TRIGLYCERIDES: 72 mg/dL (ref 0–149)
VLDL Cholesterol Cal: 14 mg/dL (ref 5–40)

## 2017-11-10 DIAGNOSIS — G44219 Episodic tension-type headache, not intractable: Secondary | ICD-10-CM | POA: Diagnosis not present

## 2017-11-29 ENCOUNTER — Ambulatory Visit (INDEPENDENT_AMBULATORY_CARE_PROVIDER_SITE_OTHER): Payer: Medicare Other | Admitting: *Deleted

## 2017-11-29 VITALS — BP 127/72 | HR 70 | Temp 97.7°F | Ht 64.0 in | Wt 134.6 lb

## 2017-11-29 DIAGNOSIS — Z0000 Encounter for general adult medical examination without abnormal findings: Secondary | ICD-10-CM | POA: Diagnosis not present

## 2017-11-29 MED ORDER — AMOXICILLIN 500 MG PO CAPS: 500.0000 mg | ORAL_CAPSULE | Freq: Two times a day (BID) | ORAL | 0 refills | Status: DC

## 2017-11-29 NOTE — Progress Notes (Signed)
Subjective:   Craig Baird is a 84 y.o. male who presents for a [employer] Visit. Belford C Hevener lives ay home in [place], [place]  with his wife. They have 2 adult children (adopted), 3 grandchildren, and great grandchildren. 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 He is a member of the [employer].  Review of Systems    Patient reports that his overall health is unchanged compared to last year.  Cardiac Risk Factors include: advanced age (>87men, >79 women);dyslipidemia    All other systems negative       Current Medications (verified) Outpatient Encounter Medications as of 11/29/2017  Medication Sig  . aspirin EC 81 MG tablet Take 1 tablet (81 mg total) by mouth daily. (Patient taking differently: Take 81 mg by mouth as needed. )  . escitalopram (LEXAPRO) 10 MG tablet Take 1 tablet (10 mg total) by mouth daily.  . fluticasone (FLONASE) 50 MCG/ACT nasal spray Place 2 sprays into both nostrils daily as needed for allergies.  Marland Kitchen amoxicillin (AMOXIL) 500 MG capsule Take 1 capsule (500 mg total) by mouth 2 (two) times daily.   No facility-administered encounter medications on file as of 11/29/2017.     Allergies (verified) Cialis [tadalafil]; Naproxen; and Sulfa antibiotics   History: Past Medical History:  Diagnosis Date  . Allergy   . Anxiety   . Cataract   . Diverticulosis   . Hernia    L inguinal hernia  . Hiatal hernia   . Hyperlipidemia   . Sinusitis    Past Surgical History:  Procedure Laterality Date  . COLONOSCOPY N/A 09/15/2016   Procedure: COLONOSCOPY;  Surgeon: Daneil Dolin, MD;  Location: [facility];  Service: Endoscopy;  Laterality: N/A;  1030   . ESOPHAGOGASTRODUODENOSCOPY N/A 09/15/2016   Procedure: ESOPHAGOGASTRODUODENOSCOPY (EGD);  Surgeon: Daneil Dolin, MD;  Location: [facility];  Service: Endoscopy;  Laterality: N/A;  . THYROID CYST EXCISION  1973  . TONSILLECTOMY     Family History    Problem Relation Age of Onset  . Hypertension Mother   . Stroke Mother   . Parkinson's disease Mother   . Hip fracture Mother   . Hypertension Brother 24  . Heart disease Brother   . Birth defects Father        spine  . Early death Sister   . Early death Sister   . Colon cancer Neg Hx   . Gastric cancer Neg Hx   . Esophageal cancer Neg Hx    Social History   Socioeconomic History  . Marital status: Married    Spouse name: Thelma   . Number of children: 2  . Years of education: Not on file  . Highest education level: Not on file  Occupational History  . Not on file  Social Needs  . Financial resource strain: Not hard at all  . Food insecurity:    Worry: Never true    Inability: Never true  . Transportation needs:    Medical: No    Non-medical: No  Tobacco Use  . Smoking status: Never Smoker  . Smokeless tobacco: Never Used  Substance and Sexual Activity  . Alcohol use: No  . Drug use: No  . Sexual activity: Yes  Lifestyle  . Physical activity:    Days per week: 2 days    Minutes per session: 30 min  . Stress: Not at all  Relationships  . Social connections:  Talks on phone: More than three times a week    Gets together: More than three times a week    Attends religious service: More than 4 times per year    Active member of club or organization: No    Attends meetings of clubs or organizations: Never    Relationship status: Married  Other Topics Concern  . Not on file  Social History Narrative  . Not on file    Tobacco Use No.   Activities of Daily Living In your present state of health, do you have any difficulty performing the following activities: 11/29/2017  Hearing? N  Vision? N  Difficulty concentrating or making decisions? N  Walking or climbing stairs? N  Dressing or bathing? N  Doing errands, shopping? N  Preparing Food and eating ? N  Using the Toilet? N  In the past six months, have you accidently leaked urine? N  Do you have  problems with loss of bowel control? N  Managing your Medications? N  Managing your Finances? N  Housekeeping or managing your Housekeeping? N  Some recent data might be hidden     Diet Patient states his overall diet is healthy  Exercise Current Exercise Habits: Home exercise routine, Time (Minutes): 30, Frequency (Times/Week): 2, Weekly Exercise (Minutes/Week): 60, Intensity: Mild, Exercise limited by: None identified    Depression Screen PHQ 2/9 Scores 11/29/2017 11/08/2017 04/18/2017 11/28/2016  PHQ - 2 Score 2 0 0 0  PHQ- 9 Score 6 - - -     Fall Risk Fall Risk  11/29/2017 11/08/2017 04/18/2017 11/28/2016 11/15/2016  Falls in the past year? No No No No No  Number falls in past yr: - - - - -  Injury with Fall? - - - - -  Comment - - - - -  Follow up - - - - -    Safety Is the patient's home free of loose throw rugs in walkways, pet beds, electrical cords, etc?   yes      Grab bars in the bathroom? yes      Walkin shower? no      Shower Seat? no      Handrails on the stairs?   yes      Adequate lighting?   no  Patient Care Team: Dettinger, Fransisca Kaufmann, MD as PCP - General (Family Medicine) Lavonna Monarch, MD as Consulting Physician (Dermatology) Gala Romney Cristopher Estimable, MD as Consulting Physician (Gastroenterology)  Hospitalizations, surgeries, and ER visits in previous 12 months No hospitalizations, ER visits, or surgeries this past year. Objective:    Today's Vitals   11/29/17 0954  BP: 127/72  Pulse: 70  Temp: 97.7 F (36.5 C)  TempSrc: Oral  Weight: 134 lb 9.6 oz (61.1 kg)  Height: 5\' 4"  (1.626 m)   Body mass index is 23.1 kg/m.  Advanced Directives 11/29/2017 11/28/2016 10/27/2016 09/15/2016 05/13/2016 11/23/2015 07/08/2014  Does Patient Have a Medical Advance Directive? No No No No No No No  Does patient want to make changes to medical advance directive? - Yes (MAU/Ambulatory/Procedural Areas - Information given) - - - - -  Copy of [place] in Chart? - No  - copy requested - - - - -  Would patient like information on creating a medical advance directive? Yes (MAU/Ambulatory/Procedural Areas - Information given) Yes (MAU/Ambulatory/Procedural Areas - Information given) - No - Patient declined No - Patient declined Yes - Educational materials given No - patient declined information    Hearing/Vision  No hearing or vision deficits noted during visit.   Cognitive Function: MMSE - Mini Mental State Exam 11/29/2017 11/28/2016 11/23/2015 07/08/2014  Orientation to time 5 5 5 5   Orientation to Place 5 5 5 5   Registration 3 3 3 3   Attention/ Calculation 5 4 5  0  Recall 2 3 2 3   Language- name 2 objects 2 2 2 2   Language- repeat 1 1 1  0  Language- follow 3 step command 3 3 3 3   Language- read & follow direction 1 1 1 1   Write a sentence 1 1 1 1   Copy design 1 0 1 1  Total score 29 28 29  -       Normal Cognitive Function Screening: Yes    Immunizations and Health Maintenance Immunization History  Administered Date(s) Administered  . Influenza Split 11/21/2012  . Influenza, High Dose Seasonal PF 11/13/2013, 11/23/2017  . Influenza,inj,Quad PF,6+ Mos 12/09/2015  . Pneumococcal Conjugate-13 07/08/2014  . Pneumococcal Polysaccharide-23 08/21/2007, 02/11/2011  . Pneumococcal-Unspecified 11/29/2014  . Tdap 11/23/2010   There are no preventive care reminders to display for this patient. Health Maintenance  Topic Date Due  . TETANUS/TDAP  11/11/2023  . INFLUENZA VACCINE  Completed  . PNA vac Low Risk Adult  Completed        Assessment:   This is a routine wellness examination for Pilar.      Plan:    Goals    . Exercise 150 minutes per week (moderate activity)    . Exercise 3x per week (30 min per time)        Health Maintenance Recommendations: Advanced directives: has NO advanced directive  - add't info requested. Referral to SW: no  Additional Screening Recommendations: Lung: Low Dose CT Chest recommended if Age 32-80  years, 30 pack-year currently smoking OR have quit w/in 15years. Patient does not qualify. Hepatitis C Screening recommended: no  Today's Orders No orders of the defined types were placed in this encounter.   Keep f/u with Dettinger, Fransisca Kaufmann, MD and any other specialty appointments you may have Continue current medications Move carefully to avoid falls. Use assistive devices like a cane or walker if needed. Aim for at least 150 minutes of moderate activity a week. This can be chair exercises if necessary. Reading or puzzles are a good way to exercise your brain Stay connected with friends and family. Social connections are beneficial to your emotional and mental health.   I have personally reviewed and noted the following in the patient's chart:   . Medical and social history . Use of alcohol, tobacco or illicit drugs  . Current medications and supplements . Functional ability and status . Nutritional status . Physical activity . Advanced directives . List of other physicians . Hospitalizations, surgeries, and ER visits in previous 12 months . Vitals . Screenings to include cognitive, depression, and falls . Referrals and appointments  In addition, I have reviewed and discussed with patient certain preventive protocols, quality metrics, and best practice recommendations. A written personalized care plan for preventive services as well as general preventive health recommendations were provided to patient.     Lynnea Ferrier, LPN   10/05/3662

## 2017-11-29 NOTE — Patient Instructions (Signed)
  Craig Baird , Thank you for taking time to come for your Medicare Wellness Visit. I appreciate your ongoing commitment to your health goals. Please review the following plan we discussed and let me know if I can assist you in the future.   These are the goals we discussed: Goals    . Exercise 150 minutes per week (moderate activity)    . Exercise 3x per week (30 min per time)       This is a list of the screening recommended for you and due dates:  Health Maintenance  Topic Date Due  . Tetanus Vaccine  11/11/2023  . Flu Shot  Completed  . Pneumonia vaccines  Completed

## 2018-02-22 ENCOUNTER — Other Ambulatory Visit: Payer: Self-pay | Admitting: *Deleted

## 2018-02-22 DIAGNOSIS — J3089 Other allergic rhinitis: Secondary | ICD-10-CM

## 2018-02-22 MED ORDER — FLUTICASONE PROPIONATE 50 MCG/ACT NA SUSP: 2.0000 | Freq: Every day | NASAL | 0 refills | Status: DC | PRN

## 2018-04-24 ENCOUNTER — Telehealth: Payer: Self-pay | Admitting: Family Medicine

## 2018-04-24 NOTE — Telephone Encounter (Signed)
Appt given per patients request 

## 2018-04-26 ENCOUNTER — Ambulatory Visit (INDEPENDENT_AMBULATORY_CARE_PROVIDER_SITE_OTHER): Payer: Medicare Other | Admitting: Family Medicine

## 2018-04-26 ENCOUNTER — Encounter: Payer: Self-pay | Admitting: Family Medicine

## 2018-04-26 VITALS — BP 135/71 | HR 84 | Temp 97.0°F | Ht 64.0 in | Wt 133.0 lb

## 2018-04-26 DIAGNOSIS — F411 Generalized anxiety disorder: Secondary | ICD-10-CM | POA: Diagnosis not present

## 2018-04-26 DIAGNOSIS — G4700 Insomnia, unspecified: Secondary | ICD-10-CM | POA: Diagnosis not present

## 2018-04-26 MED ORDER — TRAZODONE HCL 50 MG PO TABS: 25.0000 mg | ORAL_TABLET | Freq: Every evening | ORAL | 3 refills | Status: DC | PRN

## 2018-04-26 MED ORDER — ESCITALOPRAM OXALATE 10 MG PO TABS: 10.0000 mg | ORAL_TABLET | Freq: Every day | ORAL | 3 refills | Status: DC

## 2018-04-26 NOTE — Progress Notes (Signed)
BP 135/71   Pulse 84   Temp (!) 97 F (36.1 C) (Oral)   Ht 5\' 4"  (1.626 m)   Wt 133 lb (60.3 kg)   BMI 22.83 kg/m    Subjective:    Patient ID: Craig Baird, male    DOB: [birth year], 85 y.o.   MRN: [id number]  HPI: Craig Baird is a 85 y.o. male presenting on 04/26/2018 for trouble sleeping and Hyperlipidemia (check up of chronic medical conditions)   HPI Anxiety and difficulty sleeping Patient is coming in today to discuss anxiety and difficulty sleeping and recheck.  He is currently on the Lexapro but does admit that he does not take it every day and he feels like he does need to start taking it every day because he does get anxious sometimes.  He denies any major depression or suicidal ideations.  Patient says he has the hardest time falling asleep and sometimes even staying asleep is difficult for him. Depression screen [facility] 2/9 04/26/2018 11/29/2017 11/08/2017 04/18/2017 11/28/2016  Decreased Interest 0 1 0 0 0  Down, Depressed, Hopeless 1 1 0 0 0  PHQ - 2 Score 1 2 0 0 0  Altered sleeping - 2 - - -  Tired, decreased energy - 1 - - -  Change in appetite - 1 - - -  Feeling bad or failure about yourself  - 0 - - -  Trouble concentrating - 0 - - -  Moving slowly or fidgety/restless - 0 - - -  Suicidal thoughts - 0 - - -  PHQ-9 Score - 6 - - -  Difficult doing work/chores - - - - -  Some recent data might be hidden     Relevant past medical, surgical, family and social history reviewed and updated as indicated. Interim medical history since our last visit reviewed. Allergies and medications reviewed and updated.  Review of Systems  Constitutional: Negative for chills and fever.  Eyes: Negative for visual disturbance.  Respiratory: Negative for shortness of breath and wheezing.   Cardiovascular: Negative for chest pain and leg swelling.  Musculoskeletal: Negative for back pain and gait problem.  Skin: Negative for rash.  Neurological: Negative for dizziness, weakness,  light-headedness and numbness.  Psychiatric/Behavioral: Positive for sleep disturbance. Negative for decreased concentration, dysphoric mood, self-injury and suicidal ideas. The patient is nervous/anxious.   All other systems reviewed and are negative.   Per HPI unless specifically indicated above   Allergies as of 04/26/2018      Reactions   Cialis [tadalafil] Other (See Comments)   Unknown    Naproxen Other (See Comments)   Dry mouth, dizziness   Sulfa Antibiotics Rash      Medication List       Accurate as of April 26, 2018  1:25 PM. Always use your most recent med list.        aspirin EC 81 MG tablet Take 1 tablet (81 mg total) by mouth daily.   escitalopram 10 MG tablet Commonly known as:  LEXAPRO Take 1 tablet (10 mg total) by mouth daily.   fluticasone 50 MCG/ACT nasal spray Commonly known as:  FLONASE Place 2 sprays into both nostrils daily as needed for allergies.   traZODone 50 MG tablet Commonly known as:  DESYREL Take 0.5-1 tablets (25-50 mg total) by mouth at bedtime as needed for sleep.          Objective:    BP 135/71   Pulse 84   Temp Marland Kitchen)  97 F (36.1 C) (Oral)   Ht 5\' 4"  (1.626 m)   Wt 133 lb (60.3 kg)   BMI 22.83 kg/m   Wt Readings from Last 3 Encounters:  04/26/18 133 lb (60.3 kg)  11/29/17 134 lb 9.6 oz (61.1 kg)  11/08/17 133 lb (60.3 kg)    Physical Exam Vitals signs and nursing note reviewed.  Constitutional:      General: He is not in acute distress.    Appearance: He is well-developed. He is not diaphoretic.  Eyes:     General: No scleral icterus.    Conjunctiva/sclera: Conjunctivae normal.  Neck:     Musculoskeletal: Neck supple.     Thyroid: No thyromegaly.  Cardiovascular:     Rate and Rhythm: Normal rate and regular rhythm.     Heart sounds: Normal heart sounds. No murmur.  Pulmonary:     Effort: Pulmonary effort is normal. No respiratory distress.     Breath sounds: Normal breath sounds. No wheezing.    Lymphadenopathy:     Cervical: No cervical adenopathy.  Skin:    General: Skin is warm and dry.     Findings: No rash.  Neurological:     Mental Status: He is alert and oriented to person, place, and time.     Coordination: Coordination normal.  Psychiatric:        Behavior: Behavior normal.         Assessment & Plan:   Problem List Items Addressed This Visit      Other   GAD (generalized anxiety disorder)   Relevant Medications   escitalopram (LEXAPRO) 10 MG tablet   traZODone (DESYREL) 50 MG tablet   Insomnia - Primary   Relevant Medications   traZODone (DESYREL) 50 MG tablet      Will start trazodone to help with sleep and help calm the mind down so he can sleep better.  Follow up plan: Return in about 6 months (around 10/25/2018), or if symptoms worsen or fail to improve, for Anxiety and blood work, fasting.  Counseling provided for all of the vaccine components No orders of the defined types were placed in this encounter.   Caryl Pina, MD [facility] 04/26/2018, 1:25 PM

## 2018-06-14 ENCOUNTER — Other Ambulatory Visit: Payer: Self-pay | Admitting: Family Medicine

## 2019-01-07 ENCOUNTER — Telehealth: Payer: Self-pay | Admitting: Family Medicine

## 2019-01-08 ENCOUNTER — Encounter: Payer: Medicare Other | Admitting: Family Medicine

## 2019-01-08 ENCOUNTER — Other Ambulatory Visit: Payer: Self-pay | Admitting: Family Medicine

## 2019-02-19 ENCOUNTER — Other Ambulatory Visit: Payer: Self-pay

## 2019-02-20 ENCOUNTER — Encounter: Payer: Self-pay | Admitting: Family Medicine

## 2019-02-20 ENCOUNTER — Ambulatory Visit (INDEPENDENT_AMBULATORY_CARE_PROVIDER_SITE_OTHER): Payer: Medicare Other | Admitting: Family Medicine

## 2019-02-20 VITALS — BP 146/72 | HR 67 | Temp 98.6°F | Ht 64.0 in | Wt 138.4 lb

## 2019-02-20 DIAGNOSIS — Z0001 Encounter for general adult medical examination with abnormal findings: Secondary | ICD-10-CM

## 2019-02-20 DIAGNOSIS — Z0000 Encounter for general adult medical examination without abnormal findings: Secondary | ICD-10-CM

## 2019-02-20 DIAGNOSIS — E785 Hyperlipidemia, unspecified: Secondary | ICD-10-CM

## 2019-02-20 DIAGNOSIS — F411 Generalized anxiety disorder: Secondary | ICD-10-CM

## 2019-02-20 DIAGNOSIS — M1712 Unilateral primary osteoarthritis, left knee: Secondary | ICD-10-CM | POA: Diagnosis not present

## 2019-02-20 DIAGNOSIS — Z125 Encounter for screening for malignant neoplasm of prostate: Secondary | ICD-10-CM

## 2019-02-20 MED ORDER — ESCITALOPRAM OXALATE 10 MG PO TABS: 10.0000 mg | ORAL_TABLET | Freq: Two times a day (BID) | ORAL | 3 refills | Status: DC

## 2019-02-20 MED ORDER — METHYLPREDNISOLONE ACETATE 80 MG/ML IJ SUSP
80.0000 mg | Freq: Once | INTRAMUSCULAR | Status: AC
  Administered 2019-02-20: 10:00:00 80 mg via INTRA_ARTICULAR

## 2019-02-20 NOTE — Progress Notes (Signed)
BP (!) 146/72   Pulse 67   Temp 98.6 F (37 C) (Temporal)   Ht '5\' 4"'$  (1.626 m)   Wt 138 lb 6.4 oz (62.8 kg)   SpO2 97%   BMI 23.76 kg/m    Subjective:   Patient ID: Craig Baird, male    DOB: 1933/12/28, 85 y.o.   MRN: [id number]  HPI: Craig Baird is a 85 y.o. male presenting on 02/20/2019 for Annual Exam   HPI Well adult exam and recheck of chronic issues. Patient is coming in for well exam and recheck of chronic issues.  He says his left knee is bothering and wonders if he can get an injection for the arthritis in his left knee again.  Patient says his anxiety is a little bit higher and wonders if he can double up on his Lexapro to help with anxiety.   Hyperlipidemia Patient is coming in for recheck of his hyperlipidemia. The patient is currently taking no medication currently and we are monitoring.. They deny any issues with myalgias or history of liver damage from it. They deny any focal numbness or weakness or chest pain.   Relevant past medical, surgical, family and social history reviewed and updated as indicated. Interim medical history since our last visit reviewed. Allergies and medications reviewed and updated.  Review of Systems  Constitutional: Negative for chills and fever.  HENT: Negative for ear pain and tinnitus.   Eyes: Negative for pain and discharge.  Respiratory: Negative for cough, shortness of breath and wheezing.   Cardiovascular: Negative for chest pain, palpitations and leg swelling.  Gastrointestinal: Negative for abdominal pain, blood in stool, constipation and diarrhea.  Genitourinary: Negative for dysuria and hematuria.  Musculoskeletal: Positive for arthralgias. Negative for back pain, gait problem and myalgias.  Skin: Negative for rash.  Neurological: Negative for dizziness, weakness and headaches.  Psychiatric/Behavioral: Negative for suicidal ideas.  All other systems reviewed and are negative.   Per HPI unless specifically  indicated above   Allergies as of 02/20/2019      Reactions   Cialis [tadalafil] Other (See Comments)   Unknown    Naproxen Other (See Comments)   Dry mouth, dizziness   Sulfa Antibiotics Rash      Medication List       Accurate as of February 20, 2019 10:23 AM. If you have any questions, ask your nurse or doctor.        STOP taking these medications   traZODone 50 MG tablet Commonly known as: DESYREL Stopped by: Fransisca Kaufmann Chynah Orihuela, MD     TAKE these medications   aspirin EC 81 MG tablet Take 1 tablet (81 mg total) by mouth daily. What changed:   when to take this  reasons to take this   escitalopram 10 MG tablet Commonly known as: Lexapro Take 1 tablet (10 mg total) by mouth 2 (two) times daily. Hold on file until patient needs What changed:   when to take this  additional instructions Changed by: Fransisca Kaufmann Feather Berrie, MD   fluticasone 50 MCG/ACT nasal spray Commonly known as: FLONASE Place 2 sprays into both nostrils daily as needed for allergies.        Objective:   BP (!) 146/72   Pulse 67   Temp 98.6 F (37 C) (Temporal)   Ht '5\' 4"'$  (1.626 m)   Wt 138 lb 6.4 oz (62.8 kg)   SpO2 97%   BMI 23.76 kg/m   Wt Readings from Last 3  Encounters:  02/20/19 138 lb 6.4 oz (62.8 kg)  04/26/18 133 lb (60.3 kg)  11/29/17 134 lb 9.6 oz (61.1 kg)    Physical Exam Vitals reviewed.  Constitutional:      General: He is not in acute distress.    Appearance: He is well-developed. He is not diaphoretic.  HENT:     Right Ear: External ear normal.     Left Ear: External ear normal.     Nose: Nose normal.     Mouth/Throat:     Pharynx: No oropharyngeal exudate.  Eyes:     General: No scleral icterus.       Right eye: No discharge.     Conjunctiva/sclera: Conjunctivae normal.     Pupils: Pupils are equal, round, and reactive to light.  Neck:     Thyroid: No thyromegaly.  Cardiovascular:     Rate and Rhythm: Normal rate and regular rhythm.     Heart  sounds: Normal heart sounds. No murmur.  Pulmonary:     Effort: Pulmonary effort is normal. No respiratory distress.     Breath sounds: Normal breath sounds. No wheezing.  Abdominal:     General: Bowel sounds are normal. There is no distension.     Palpations: Abdomen is soft.     Tenderness: There is no abdominal tenderness. There is no guarding or rebound.  Genitourinary:    Prostate: Normal.     Rectum: Normal. Guaiac result negative.  Musculoskeletal:        General: Tenderness (Left medial knee tenderness) present. Normal range of motion.     Cervical back: Neck supple.  Lymphadenopathy:     Cervical: No cervical adenopathy.  Skin:    General: Skin is warm and dry.     Findings: No rash.  Neurological:     Mental Status: He is alert and oriented to person, place, and time.     Coordination: Coordination normal.  Psychiatric:        Behavior: Behavior normal.    Knee injection: Consent form signed. Risk factors of bleeding and infection discussed with patient and patient is agreeable towards injection. Patient prepped with Betadine. Lateral approach towards injection used. Injected 80 mg of Depo-Medrol and 1 mL of 2% lidocaine. Patient tolerated procedure well and no side effects from noted. Minimal to no bleeding. Simple bandage applied after.    Assessment & Plan:   Problem List Items Addressed This Visit      Other   GAD (generalized anxiety disorder)   Relevant Medications   escitalopram (LEXAPRO) 10 MG tablet   Other Relevant Orders   CBC with Differential/Platelet   CMP14+EGFR   Hyperlipidemia - Primary   Relevant Orders   Lipid panel    Other Visit Diagnoses    Primary osteoarthritis of left knee       Relevant Medications   methylPREDNISolone acetate (DEPO-MEDROL) injection 80 mg (Start on 02/20/2019 10:30 AM)   Prostate cancer screening       Relevant Orders   PSA, total and free   Well adult exam       Relevant Orders   CBC with Differential/Platelet    CMP14+EGFR   Lipid panel   PSA, total and free      Continue current medication, will check blood work, increase Lexapro to twice daily. Follow up plan: Return in about 1 year (around 02/20/2020), or if symptoms worsen or fail to improve, for Recheck cholesterol.  Counseling provided for all of the vaccine components  Orders Placed This Encounter  Procedures  . CBC with Differential/Platelet  . CMP14+EGFR  . Lipid panel  . PSA, total and free    Caryl Pina, MD [facility] 02/20/2019, 10:23 AM

## 2019-02-21 LAB — CBC WITH DIFFERENTIAL/PLATELET
Basophils Absolute: 0.1 10*3/uL (ref 0.0–0.2)
Basos: 2 %
EOS (ABSOLUTE): 0.2 10*3/uL (ref 0.0–0.4)
Eos: 4 %
Hematocrit: 44.4 % (ref 37.5–51.0)
Hemoglobin: 15.2 g/dL (ref 13.0–17.7)
Immature Grans (Abs): 0 10*3/uL (ref 0.0–0.1)
Immature Granulocytes: 0 %
Lymphocytes Absolute: 1.6 10*3/uL (ref 0.7–3.1)
Lymphs: 29 %
MCH: 32.9 pg (ref 26.6–33.0)
MCHC: 34.2 g/dL (ref 31.5–35.7)
MCV: 96 fL (ref 79–97)
Monocytes Absolute: 0.5 10*3/uL (ref 0.1–0.9)
Monocytes: 10 %
Neutrophils Absolute: 3.1 10*3/uL (ref 1.4–7.0)
Neutrophils: 55 %
Platelets: 278 10*3/uL (ref 150–450)
RBC: 4.62 x10E6/uL (ref 4.14–5.80)
RDW: 12.4 % (ref 11.6–15.4)
WBC: 5.6 10*3/uL (ref 3.4–10.8)

## 2019-02-21 LAB — CMP14+EGFR
ALT: 15 IU/L (ref 0–44)
AST: 21 IU/L (ref 0–40)
Albumin/Globulin Ratio: 1.5 (ref 1.2–2.2)
Albumin: 4.2 g/dL (ref 3.6–4.6)
Alkaline Phosphatase: 100 IU/L (ref 39–117)
BUN/Creatinine Ratio: 14 (ref 10–24)
BUN: 12 mg/dL (ref 8–27)
Bilirubin Total: 0.3 mg/dL (ref 0.0–1.2)
CO2: 23 mmol/L (ref 20–29)
Calcium: 9 mg/dL (ref 8.6–10.2)
Chloride: 101 mmol/L (ref 96–106)
Creatinine, Ser: 0.87 mg/dL (ref 0.76–1.27)
GFR calc Af Amer: 92 mL/min/{1.73_m2} (ref >59)
GFR calc non Af Amer: 80 mL/min/{1.73_m2} (ref >59)
Globulin, Total: 2.8 g/dL (ref 1.5–4.5)
Glucose: 90 mg/dL (ref 65–99)
Potassium: 4.7 mmol/L (ref 3.5–5.2)
Sodium: 142 mmol/L (ref 134–144)
Total Protein: 7 g/dL (ref 6.0–8.5)

## 2019-02-21 LAB — LIPID PANEL
Chol/HDL Ratio: 4.2 ratio (ref 0.0–5.0)
Cholesterol, Total: 237 mg/dL — ABNORMAL HIGH (ref 100–199)
HDL: 56 mg/dL (ref >39)
LDL Chol Calc (NIH): 163 mg/dL — ABNORMAL HIGH (ref 0–99)
Triglycerides: 104 mg/dL (ref 0–149)
VLDL Cholesterol Cal: 18 mg/dL (ref 5–40)

## 2019-02-21 LAB — PSA, TOTAL AND FREE
PSA, Free Pct: 19.5 %
PSA, Free: 0.39 ng/mL
Prostate Specific Ag, Serum: 2 ng/mL (ref 0.0–4.0)

## 2019-02-24 ENCOUNTER — Telehealth: Payer: Self-pay | Admitting: Family Medicine

## 2019-02-24 NOTE — Telephone Encounter (Signed)
Please review last labs.

## 2019-02-25 ENCOUNTER — Other Ambulatory Visit: Payer: Self-pay | Admitting: Family Medicine

## 2019-02-25 MED ORDER — ATORVASTATIN CALCIUM 20 MG PO TABS: 20.0000 mg | ORAL_TABLET | Freq: Every day | ORAL | 3 refills | Status: DC

## 2019-02-26 ENCOUNTER — Other Ambulatory Visit: Payer: Self-pay | Admitting: Dermatology

## 2019-02-26 DIAGNOSIS — D485 Neoplasm of uncertain behavior of skin: Secondary | ICD-10-CM | POA: Diagnosis not present

## 2019-02-26 DIAGNOSIS — D229 Melanocytic nevi, unspecified: Secondary | ICD-10-CM | POA: Diagnosis not present

## 2019-02-26 DIAGNOSIS — L988 Other specified disorders of the skin and subcutaneous tissue: Secondary | ICD-10-CM | POA: Diagnosis not present

## 2019-02-26 DIAGNOSIS — L820 Inflamed seborrheic keratosis: Secondary | ICD-10-CM | POA: Diagnosis not present

## 2019-02-26 DIAGNOSIS — C44519 Basal cell carcinoma of skin of other part of trunk: Secondary | ICD-10-CM | POA: Diagnosis not present

## 2019-02-26 DIAGNOSIS — D692 Other nonthrombocytopenic purpura: Secondary | ICD-10-CM | POA: Diagnosis not present

## 2019-03-24 ENCOUNTER — Other Ambulatory Visit: Payer: Self-pay | Admitting: Dermatology

## 2019-03-24 DIAGNOSIS — C44519 Basal cell carcinoma of skin of other part of trunk: Secondary | ICD-10-CM | POA: Diagnosis not present

## 2019-03-24 DIAGNOSIS — D485 Neoplasm of uncertain behavior of skin: Secondary | ICD-10-CM | POA: Diagnosis not present

## 2019-03-24 DIAGNOSIS — L432 Lichenoid drug reaction: Secondary | ICD-10-CM | POA: Diagnosis not present

## 2019-03-24 DIAGNOSIS — L570 Actinic keratosis: Secondary | ICD-10-CM | POA: Diagnosis not present

## 2019-05-21 ENCOUNTER — Other Ambulatory Visit: Payer: Self-pay

## 2019-05-21 ENCOUNTER — Encounter: Payer: Self-pay | Admitting: Gastroenterology

## 2019-05-21 ENCOUNTER — Ambulatory Visit: Payer: Medicare Other | Admitting: Gastroenterology

## 2019-05-21 VITALS — BP 125/65 | HR 65 | Temp 96.9°F | Ht 66.0 in | Wt 139.6 lb

## 2019-05-21 DIAGNOSIS — J309 Allergic rhinitis, unspecified: Secondary | ICD-10-CM | POA: Diagnosis not present

## 2019-05-21 DIAGNOSIS — R110 Nausea: Secondary | ICD-10-CM | POA: Diagnosis not present

## 2019-05-21 DIAGNOSIS — J3089 Other allergic rhinitis: Secondary | ICD-10-CM | POA: Diagnosis not present

## 2019-05-21 MED ORDER — FLUTICASONE PROPIONATE 50 MCG/ACT NA SUSP: 2.0000 | Freq: Every day | NASAL | 0 refills | Status: DC | PRN

## 2019-05-21 NOTE — Assessment & Plan Note (Signed)
Nausea in the setting of sinus issues, postnasal drip.  Currently patient has not been on his Flonase, ran out of prescription.  Complains of springtime allergies every year.  He denies any postprandial nausea but mentions having less of appetite.  He has had no weight loss.  Denies purulent sinus discharge.  Given his history of C. difficile diarrhea, would avoid antibiotic therapy unless necessary.  We will have patient resume Flonase 2 sprays each nostril daily.  He will call our report in 2 weeks, if persistent sinus issues, would consider antibiotic therapy.  If persisting nausea, would have low threshold for further work-up for nausea and diminished appetite, likely CT imaging to rule out malignancy.  Patient had upper endoscopy in 2018 for similar symptoms without significant findings.

## 2019-05-21 NOTE — Patient Instructions (Signed)
1. Start Flonase 2 sprays each nostril daily.  Prescription sent to your pharmacy. 2. If your sinuses are not better in a couple weeks, please call our office and speak to one of my nurses.  At that point we will consider antibiotic therapy. 3. If your nausea does not improve with management of your sinuses, you will need further work-up.  Next step would be considering CT of your abdomen.  Please call us in 2 weeks and let us know how you are feeling.

## 2019-05-21 NOTE — Progress Notes (Signed)
Primary Care Physician: Dettinger, Fransisca Kaufmann, MD  Primary Gastroenterologist:  Garfield Cornea, MD   Chief Complaint  Patient presents with  . Abdominal Pain    nausea,eats less than usual    HPI: Craig Baird is a 86 y.o. male here for further evaluation of nausea and diminished appetite.  Patient was last seen in November 2018.  He has a history of C. difficile associated diarrhea/abdominal pain around that time.  For one month having sinus issues. A lot post drainage drip.  Long history of allergies and sinuses.  For the past month having quite a bit of postnasal drip.  Feels like making him nauseated. Clear drainage when blows nose. No vomiting. No heartburn or dysphagia. BM regular. No melena, brbpr. Appetite not as good as before. Weight is stable.   Currently out of Flonase, has not tried recurrent symptoms.   Current Outpatient Medications  Medication Sig Dispense Refill  . aspirin EC 81 MG tablet Take 1 tablet (81 mg total) by mouth daily. (Patient taking differently: Take 81 mg by mouth as needed. ) 100 tablet 11  . atorvastatin (LIPITOR) 20 MG tablet Take 1 tablet (20 mg total) by mouth daily. 90 tablet 3  . escitalopram (LEXAPRO) 10 MG tablet Take 1 tablet (10 mg total) by mouth 2 (two) times daily. Hold on file until patient needs 180 tablet 3  . fluticasone (FLONASE) 50 MCG/ACT nasal spray Place 2 sprays into both nostrils daily as needed for allergies. (Patient taking differently: Place 2 sprays into both nostrils as needed for allergies. ) 48 g 0   No current facility-administered medications for this visit.    Allergies as of 05/21/2019 - Review Complete 05/21/2019  Allergen Reaction Noted  . Sulfa antibiotics Rash 08/12/2012    ROS:  General: Negative for weight loss, fever, chills, fatigue, weakness. ENT: Negative for hoarseness, difficulty swallowing , nasal congestion. CV: Negative for chest pain, angina, palpitations, dyspnea on exertion,  peripheral edema.  Respiratory: Negative for dyspnea at rest, dyspnea on exertion, cough, sputum, wheezing.  GI: See history of present illness. GU:  Negative for dysuria, hematuria, urinary incontinence, urinary frequency, nocturnal urination.  Endo: Negative for unusual weight change.    Physical Examination:   BP 125/65   Pulse 65   Temp (!) 96.9 F (36.1 C) (Temporal)   Ht 5\' 6"  (1.676 m)   Wt 139 lb 9.6 oz (63.3 kg)   BMI 22.53 kg/m   General: Well-nourished, well-developed in no acute distress.  Eyes: No icterus. Mouth: Oropharyngeal mucosa moist and pink   No sinus tenderness with percussion.  Lungs: Clear to auscultation bilaterally.  Heart: Regular rate and rhythm, no murmurs rubs or gallops.  Abdomen: Bowel sounds are normal, nontender, nondistended, no hepatosplenomegaly or masses, no abdominal bruits or hernia , no rebound or guarding.   Extremities: No lower extremity edema. No clubbing or deformities. Neuro: Alert and oriented x 4   Skin: Warm and dry, no jaundice.   Psych: Alert and cooperative, normal mood and affect.  Labs:  Lab Results  Component Value Date   CREATININE 0.87 02/20/2019   BUN 12 02/20/2019   NA 142 02/20/2019   K 4.7 02/20/2019   CL 101 02/20/2019   CO2 23 02/20/2019   Lab Results  Component Value Date   ALT 15 02/20/2019   AST 21 02/20/2019   ALKPHOS 100 02/20/2019   BILITOT 0.3 02/20/2019   Lab Results  Component Value Date  WBC 5.6 02/20/2019   HGB 15.2 02/20/2019   HCT 44.4 02/20/2019   MCV 96 02/20/2019   PLT 278 02/20/2019     Imaging Studies: No results found.

## 2019-05-30 ENCOUNTER — Ambulatory Visit: Payer: Medicare Other | Attending: Internal Medicine

## 2019-05-30 DIAGNOSIS — Z23 Encounter for immunization: Secondary | ICD-10-CM

## 2019-05-30 NOTE — Progress Notes (Signed)
   Covid-19 Vaccination Clinic  Name:  Craig Baird    MRN: [id number] DOB: Jan 07, 1934  05/30/2019  [name] was observed post Covid-19 immunization for 15 minutes without incident. He was provided with Vaccine Information Sheet and instruction to access the V-Safe system.   [name] was instructed to call 911 with any severe reactions post vaccine: Marland Kitchen Difficulty breathing  . Swelling of face and throat  . A fast heartbeat  . A bad rash all over body  . Dizziness and weakness   Immunizations Administered    Name Date Dose VIS Date Route   Moderna COVID-19 Vaccine 05/30/2019 10:30 AM 0.5 mL 02/11/2019 Intramuscular   Manufacturer: Moderna   Lot: [id number]   [id number]

## 2019-07-01 ENCOUNTER — Ambulatory Visit: Payer: Medicare Other | Attending: Internal Medicine

## 2019-07-01 DIAGNOSIS — Z23 Encounter for immunization: Secondary | ICD-10-CM

## 2019-07-01 NOTE — Progress Notes (Signed)
   Covid-19 Vaccination Clinic  Name:  TELLAS LUEVANO    MRN: [id number] DOB: 08/10/1933  07/01/2019  Mr. Misko was observed post Covid-19 immunization for 15 minutes without incident. He was provided with Vaccine Information Sheet and instruction to access the V-Safe system.   Mr. Sokolik was instructed to call 911 with any severe reactions post vaccine: Marland Kitchen Difficulty breathing  . Swelling of face and throat  . A fast heartbeat  . A bad rash all over body  . Dizziness and weakness   Immunizations Administered    Name Date Dose VIS Date Route   Moderna COVID-19 Vaccine 07/01/2019 11:23 AM 0.5 mL 02/2019 Intramuscular   Manufacturer: Levan Hurst   Lot: [id number]   [place]: [id number]      Covid-19 Vaccination Clinic  Name:  Generoso RUSTON RAPOZO    MRN: [id number] DOB: [birth year]  07/01/2019  Mr. Warf was observed post Covid-19 immunization for 15 minutes without incident. He was provided with Vaccine Information Sheet and instruction to access the V-Safe system.   Mr. Deraad was instructed to call 911 with any severe reactions post vaccine: Marland Kitchen Difficulty breathing  . Swelling of face and throat  . A fast heartbeat  . A bad rash all over body  . Dizziness and weakness   Immunizations Administered    Name Date Dose VIS Date Route   Moderna COVID-19 Vaccine 07/01/2019 11:23 AM 0.5 mL 02/2019 Intramuscular   Manufacturer: Moderna   Lot: [id number]   [place][id number]

## 2019-09-22 ENCOUNTER — Other Ambulatory Visit: Payer: Self-pay

## 2019-09-22 ENCOUNTER — Ambulatory Visit: Payer: Medicare Other | Admitting: Dermatology

## 2019-09-22 DIAGNOSIS — C4492 Squamous cell carcinoma of skin, unspecified: Secondary | ICD-10-CM

## 2019-09-22 DIAGNOSIS — D692 Other nonthrombocytopenic purpura: Secondary | ICD-10-CM | POA: Diagnosis not present

## 2019-09-22 DIAGNOSIS — L570 Actinic keratosis: Secondary | ICD-10-CM

## 2019-09-22 DIAGNOSIS — C44622 Squamous cell carcinoma of skin of right upper limb, including shoulder: Secondary | ICD-10-CM

## 2019-09-22 HISTORY — DX: Squamous cell carcinoma of skin, unspecified: C44.92

## 2019-09-22 NOTE — Patient Instructions (Addendum)
Biopsy, Surgery (Curettage) & Surgery (Excision) Aftercare Instructions  1. Okay to remove bandage in 24 hours  2. Wash area with soap and water  3. Apply Vaseline to area twice daily until healed (Not Neosporin)  4. Okay to cover with a Band-Aid to decrease the chance of infection or prevent irritation from clothing; also it's okay to uncover lesion at home.  5. Suture instructions: return to our office in 7-10 or 10-14 days for a nurse visit for suture removal. Variable healing with sutures, if pain or itching occurs call our office. It's okay to shower or bathe 24 hours after sutures are given.  6. The following risks may occur after a biopsy, curettage or excision: bleeding, scarring, discoloration, recurrence, infection (redness, yellow drainage, pain or swelling).  7. For questions, concerns and results call our office at [place] before 4pm & Friday before 3pm. Biopsy results will be available in 1 week.  Several issues addressed this morning with Craig Baird, date of birth 04-Nov-1933.  Of most concern was a rapidly growing 1.7 cm volcano-like nodule on his right forearm which likely represents a type of skin cancer called a KA.  Shave biopsy showed obvious deep keratin root which was treated with curette plus cautery.  He can expect an abrasion on this area for at least a month but there are no restrictions.  He may use triple antibiotic or Vaseline daily and he can call me if there is any problem with the area.  Small precancers on the left ear were treated with 5-second freeze.  A thicker crust on the right shin was treated with 8-second freeze and will be biopsied if the freezing fails.  We also discussed his fragile skin and easy bruising on his forearms only; he may look for a nonprescription cream called Dermend and apply it daily to areas prone to bruise on the arms.  If there is no improvement with 1 tube then he should not refill this.

## 2019-09-23 ENCOUNTER — Encounter: Payer: Self-pay | Admitting: Dermatology

## 2019-09-23 NOTE — Progress Notes (Signed)
Follow-Up Visit   Subjective  Craig Baird is a 86 y.o. male who presents for the following: Annual Exam (Patient has 2 new spots on right arm and right leg. He also wants [place] to check scalp, back and face. ).  Skin check, new spots on arm and leg Location:  Duration:  Quality:  Associated Signs/Symptoms: Modifying Factors:  Severity:  Timing: Context:   Objective  Well appearing patient in no apparent distress; mood and affect are within normal limits.  All skin waist up examined.  Plus legs.   Assessment & Plan  Several issues addressed this morning with Craig Baird, date of birth 1934/02/08.  Of most concern was a rapidly growing 1.7 cm volcano-like nodule on his right forearm which likely represents a type of skin cancer called a KA.  Shave biopsy showed obvious deep keratin root which was treated with curette plus cautery.  He can expect an abrasion on this area for at least a month but there are no restrictions.  He may use triple antibiotic or Vaseline daily and he can call me if there is any problem with the area.  Small precancers on the left ear were treated with 5-second freeze.  A thicker crust on the right shin was treated with 8-second freeze and will be biopsied if the freezing fails.  We also discussed his fragile skin and easy bruising on his forearms only; he may look for a nonprescription cream called Dermend and apply it daily to areas prone to bruise on the arms.  If there is no improvement with 1 to then he should not refill this. Patient Instructions  Biopsy, Surgery (Curettage) & Surgery (Excision) Aftercare Instructions  1. Okay to remove bandage in 24 hours  2. Wash area with soap and water  3. Apply Vaseline to area twice daily until healed (Not Neosporin)  4. Okay to cover with a Band-Aid to decrease the chance of infection or prevent irritation from clothing; also it's okay to uncover lesion at home.  5. Suture instructions: return to our  office in 7-10 or 10-14 days for a nurse visit for suture removal. Variable healing with sutures, if pain or itching occurs call our office. It's okay to shower or bathe 24 hours after sutures are given.  6. The following risks may occur after a biopsy, curettage or excision: bleeding, scarring, discoloration, recurrence, infection (redness, yellow drainage, pain or swelling).  7. For questions, concerns and results call our office at [place] before 4pm & Friday before 3pm. Biopsy results will be available in 1 week.  Several issues addressed this morning with Craig Baird, date of birth Jul 11, 1933.  Of most concern was a rapidly growing 1.7 cm volcano-like nodule on his right forearm which likely represents a type of skin cancer called a KA.  Shave biopsy showed obvious deep keratin root which was treated with curette plus cautery.  He can expect an abrasion on this area for at least a month but there are no restrictions.  He may use triple antibiotic or Vaseline daily and he can call me if there is any problem with the area.  Small precancers on the left ear were treated with 5-second freeze.  A thicker crust on the right shin was treated with 8-second freeze and will be biopsied if the freezing fails.  We also discussed his fragile skin and easy bruising on his forearms only; he may look for a nonprescription cream called Dermend and apply it daily to areas prone  to bruise on the arms.  If there is no improvement with 1 tube then he should not refill this.   AK (actinic keratosis) (2) Left Ear; Right Lower Leg - Anterior  Destruction of lesion - Left Ear, Right Lower Leg - Anterior Complexity: simple   Destruction method: cryotherapy   Informed consent: discussed and consent obtained   Timeout:  patient name, date of birth, surgical site, and procedure verified Lesion destroyed using liquid nitrogen: Yes   Region frozen until ice ball extended beyond lesion: Yes   Cryotherapy cycles:   5 Outcome: patient tolerated procedure well with no complications    Senile purpura (HCC) (2) Right Hand - Posterior; Left Forearm - Posterior  We will try over-the-counter DERMEND daily after bathing for 1 month.  SCCA (squamous cell carcinoma) of skin Right Forearm - Posterior  Skin / nail biopsy  Destruction of lesion Complexity: simple   Destruction method: electrodesiccation and curettage   Informed consent: discussed and consent obtained   Timeout:  patient name, date of birth, surgical site, and procedure verified Anesthesia: the lesion was anesthetized in a standard fashion   Anesthetic:  1% lidocaine w/ epinephrine 1-100,000 local infiltration Curettage performed in three different directions: Yes   Curettage cycles:  3 Lesion length (cm):  1.6 Lesion width (cm):  1 Margin per side (cm):  0 Final wound size (cm):  1.6 Hemostasis achieved with:  ferric subsulfate Outcome: patient tolerated procedure well with no complications   Post-procedure details: wound care instructions given    Specimen 1 - Surgical pathology Differential Diagnosis: scc vs bcc Check Margins: No  Shave biopsy showed obvious deep keratin extension; this was curetted x3 followed by electrocautery.     I, Craig Monarch, MD, have reviewed all documentation for this visit.  The documentation on 10/12/19 for the exam, diagnosis, procedures, and orders are all accurate and complete.

## 2019-09-30 ENCOUNTER — Telehealth: Payer: Self-pay | Admitting: Dermatology

## 2019-09-30 ENCOUNTER — Encounter: Payer: Self-pay | Admitting: Dermatology

## 2019-09-30 NOTE — Telephone Encounter (Signed)
Biopsy results, ST patient

## 2019-09-30 NOTE — Telephone Encounter (Signed)
Phone call to patient with his pathology results. Patient aware of results.  

## 2019-11-24 ENCOUNTER — Ambulatory Visit: Payer: Medicare Other | Admitting: Dermatology

## 2019-11-27 ENCOUNTER — Other Ambulatory Visit: Payer: Medicare Other

## 2020-02-23 ENCOUNTER — Ambulatory Visit (INDEPENDENT_AMBULATORY_CARE_PROVIDER_SITE_OTHER): Payer: Medicare Other | Admitting: Family Medicine

## 2020-02-23 ENCOUNTER — Other Ambulatory Visit: Payer: Self-pay

## 2020-02-23 ENCOUNTER — Ambulatory Visit (INDEPENDENT_AMBULATORY_CARE_PROVIDER_SITE_OTHER): Payer: Medicare Other

## 2020-02-23 ENCOUNTER — Encounter: Payer: Self-pay | Admitting: Family Medicine

## 2020-02-23 VITALS — BP 130/68 | HR 62 | Temp 97.0°F | Ht 66.0 in | Wt 141.0 lb

## 2020-02-23 DIAGNOSIS — M8588 Other specified disorders of bone density and structure, other site: Secondary | ICD-10-CM | POA: Diagnosis not present

## 2020-02-23 DIAGNOSIS — Z125 Encounter for screening for malignant neoplasm of prostate: Secondary | ICD-10-CM

## 2020-02-23 DIAGNOSIS — M8580 Other specified disorders of bone density and structure, unspecified site: Secondary | ICD-10-CM | POA: Diagnosis not present

## 2020-02-23 DIAGNOSIS — E785 Hyperlipidemia, unspecified: Secondary | ICD-10-CM | POA: Diagnosis not present

## 2020-02-23 DIAGNOSIS — Z0001 Encounter for general adult medical examination with abnormal findings: Secondary | ICD-10-CM | POA: Diagnosis not present

## 2020-02-23 DIAGNOSIS — Z0000 Encounter for general adult medical examination without abnormal findings: Secondary | ICD-10-CM

## 2020-02-23 DIAGNOSIS — F411 Generalized anxiety disorder: Secondary | ICD-10-CM | POA: Diagnosis not present

## 2020-02-23 MED ORDER — ATORVASTATIN CALCIUM 20 MG PO TABS: 20.0000 mg | ORAL_TABLET | Freq: Every day | ORAL | 3 refills | Status: DC

## 2020-02-23 MED ORDER — PAROXETINE HCL 10 MG PO TABS: 10.0000 mg | ORAL_TABLET | Freq: Every day | ORAL | 3 refills | Status: DC

## 2020-02-23 NOTE — Progress Notes (Signed)
BP 130/68    Pulse 62    Temp (!) 97 F (36.1 C)    Ht _0  (1.676 m)    Wt 141 lb (64 kg)    SpO2 97%    BMI 22.76 kg/m    Subjective:   Patient ID: Craig Baird, male    DOB: 02-Sep-1933, 86 y.o.   MRN: [id number]  HPI: Craig Baird is a 86 y.o. male presenting on 02/23/2020 for Medical Management of Chronic Issues (CPE)   HPI Adult well exam and recheck of chronic issues. Patient is coming in today for adult well exam chronic issue recheck.  He says the cholesterol pills been doing okay from it he takes it but not every day all the time.  He does say that he takes it most of the time.  Patient says his anxiety medicine not helping as much as it used to and he would like to do a change.  Patient denies any suicidal ideations but just says he has a little more anxiety especially through this time a year.  Patient is otherwise doing well and denies any health issues. Patient denies any chest pain, shortness of breath, headaches or vision issues, abdominal complaints, diarrhea, nausea, vomiting, or joint issues.   Relevant past medical, surgical, family and social history reviewed and updated as indicated. Interim medical history since our last visit reviewed. Allergies and medications reviewed and updated.  Review of Systems  Constitutional: Negative for chills and fever.  HENT: Negative for ear pain and tinnitus.   Eyes: Negative for pain.  Respiratory: Negative for cough, shortness of breath and wheezing.   Cardiovascular: Negative for chest pain, palpitations and leg swelling.  Gastrointestinal: Negative for abdominal pain, blood in stool, constipation and diarrhea.  Genitourinary: Negative for dysuria and hematuria.  Musculoskeletal: Negative for back pain, gait problem and myalgias.  Skin: Negative for rash.  Neurological: Negative for dizziness, weakness, numbness and headaches.  Psychiatric/Behavioral: Negative for dysphoric mood, self-injury, sleep disturbance and  suicidal ideas. The patient is nervous/anxious. The patient is not hyperactive.   All other systems reviewed and are negative.   Per HPI unless specifically indicated above   Allergies as of 02/23/2020      Reactions   Sulfa Antibiotics Rash      Medication List       Accurate as of February 23, 2020 10:53 AM. If you have any questions, ask your nurse or doctor.        STOP taking these medications   escitalopram 10 MG tablet Commonly known as: Lexapro Stopped by: Fransisca Kaufmann Makaylynn Bonillas, MD     TAKE these medications   aspirin EC 81 MG tablet Take 1 tablet (81 mg total) by mouth daily. What changed:   when to take this  reasons to take this   atorvastatin 20 MG tablet Commonly known as: LIPITOR Take 1 tablet (20 mg total) by mouth daily.   fluticasone 50 MCG/ACT nasal spray Commonly known as: FLONASE Place 2 sprays into both nostrils daily as needed for allergies.   PARoxetine 10 MG tablet Commonly known as: Paxil Take 1 tablet (10 mg total) by mouth daily. Started by: Fransisca Kaufmann Rosaly Labarbera, MD        Objective:   BP 130/68    Pulse 62    Temp (!) 97 F (36.1 C)    Ht _1  (1.676 m)    Wt 141 lb (64 kg)    SpO2 97%  BMI 22.76 kg/m   Wt Readings from Last 3 Encounters:  02/23/20 141 lb (64 kg)  05/21/19 139 lb 9.6 oz (63.3 kg)  02/20/19 138 lb 6.4 oz (62.8 kg)    Physical Exam Vitals and nursing note reviewed.  Constitutional:      General: He is not in acute distress.    Appearance: He is well-developed and well-nourished. He is not diaphoretic.  HENT:     Right Ear: External ear normal.     Left Ear: External ear normal.     Nose: Nose normal.     Mouth/Throat:     Mouth: Oropharynx is clear and moist.     Pharynx: No oropharyngeal exudate.  Eyes:     General: No scleral icterus.       Right eye: No discharge.     Extraocular Movements: EOM normal.     Conjunctiva/sclera: Conjunctivae normal.     Pupils: Pupils are equal, round, and reactive  to light.  Neck:     Thyroid: No thyromegaly.  Cardiovascular:     Rate and Rhythm: Normal rate and regular rhythm.     Pulses: Intact distal pulses.     Heart sounds: Normal heart sounds. No murmur heard.   Pulmonary:     Effort: Pulmonary effort is normal. No respiratory distress.     Breath sounds: Normal breath sounds. No wheezing.  Abdominal:     General: Bowel sounds are normal. There is no distension.     Palpations: Abdomen is soft.     Tenderness: There is no abdominal tenderness. There is no guarding or rebound.  Genitourinary:    Prostate: Enlarged. Not tender and no nodules present.  Musculoskeletal:        General: No edema. Normal range of motion.     Cervical back: Neck supple.  Lymphadenopathy:     Cervical: No cervical adenopathy.  Skin:    General: Skin is warm and dry.     Findings: No rash.  Neurological:     Mental Status: He is alert and oriented to person, place, and time.     Coordination: Coordination normal.  Psychiatric:        Mood and Affect: Mood and affect normal.        Behavior: Behavior normal.       Assessment & Plan:   Problem List Items Addressed This Visit      Musculoskeletal and Integument   Osteopenia   Relevant Orders   DG [facility] DEXA     Other   GAD (generalized anxiety disorder)   Relevant Medications   PARoxetine (PAXIL) 10 MG tablet   Other Relevant Orders   CBC with Differential/Platelet   CMP14+EGFR   Hyperlipidemia   Relevant Medications   atorvastatin (LIPITOR) 20 MG tablet   Other Relevant Orders   Lipid panel    Other Visit Diagnoses    Well adult exam    -  Primary   Relevant Orders   CBC with Differential/Platelet   CMP14+EGFR   Lipid panel   PSA, total and free   DG [facility] DEXA   Prostate cancer screening       Relevant Orders   PSA, total and free      Will switch patient to Paxil for anxiety, continue the Lipitor, continue other current medication and will do blood work.  Follow-up in 1  year. Follow up plan: Return in about 1 year (around 02/22/2021), or if symptoms worsen or fail to improve,  for Physical exam.  Counseling provided for all of the vaccine components Orders Placed This Encounter  Procedures   DG [facility] DEXA   CBC with Differential/Platelet   CMP14+EGFR   Lipid panel   PSA, total and free    Caryl Pina, MD [facility] 02/23/2020, 10:53 AM

## 2020-02-24 LAB — CBC WITH DIFFERENTIAL/PLATELET
Basophils Absolute: 0.1 10*3/uL (ref 0.0–0.2)
Basos: 1 %
EOS (ABSOLUTE): 0.4 10*3/uL (ref 0.0–0.4)
Eos: 5 %
Hematocrit: 43.3 % (ref 37.5–51.0)
Hemoglobin: 15 g/dL (ref 13.0–17.7)
Immature Grans (Abs): 0 10*3/uL (ref 0.0–0.1)
Immature Granulocytes: 0 %
Lymphocytes Absolute: 1.9 10*3/uL (ref 0.7–3.1)
Lymphs: 29 %
MCH: 33 pg (ref 26.6–33.0)
MCHC: 34.6 g/dL (ref 31.5–35.7)
MCV: 95 fL (ref 79–97)
Monocytes Absolute: 0.6 10*3/uL (ref 0.1–0.9)
Monocytes: 9 %
Neutrophils Absolute: 3.7 10*3/uL (ref 1.4–7.0)
Neutrophils: 56 %
Platelets: 288 10*3/uL (ref 150–450)
RBC: 4.55 x10E6/uL (ref 4.14–5.80)
RDW: 12.6 % (ref 11.6–15.4)
WBC: 6.7 10*3/uL (ref 3.4–10.8)

## 2020-02-24 LAB — CMP14+EGFR
ALT: 16 IU/L (ref 0–44)
AST: 21 IU/L (ref 0–40)
Albumin/Globulin Ratio: 1.5 (ref 1.2–2.2)
Albumin: 4.3 g/dL (ref 3.6–4.6)
Alkaline Phosphatase: 111 IU/L (ref 44–121)
BUN/Creatinine Ratio: 13 (ref 10–24)
BUN: 12 mg/dL (ref 8–27)
Bilirubin Total: 0.4 mg/dL (ref 0.0–1.2)
CO2: 26 mmol/L (ref 20–29)
Calcium: 9.2 mg/dL (ref 8.6–10.2)
Chloride: 104 mmol/L (ref 96–106)
Creatinine, Ser: 0.91 mg/dL (ref 0.76–1.27)
GFR calc Af Amer: 89 mL/min/{1.73_m2} (ref >59)
GFR calc non Af Amer: 77 mL/min/{1.73_m2} (ref >59)
Globulin, Total: 2.9 g/dL (ref 1.5–4.5)
Glucose: 95 mg/dL (ref 65–99)
Potassium: 5 mmol/L (ref 3.5–5.2)
Sodium: 144 mmol/L (ref 134–144)
Total Protein: 7.2 g/dL (ref 6.0–8.5)

## 2020-02-24 LAB — PSA, TOTAL AND FREE
PSA, Free Pct: 18.6 %
PSA, Free: 0.39 ng/mL
Prostate Specific Ag, Serum: 2.1 ng/mL (ref 0.0–4.0)

## 2020-02-24 LAB — LIPID PANEL
Chol/HDL Ratio: 3.1 ratio (ref 0.0–5.0)
Cholesterol, Total: 167 mg/dL (ref 100–199)
HDL: 54 mg/dL (ref >39)
LDL Chol Calc (NIH): 97 mg/dL (ref 0–99)
Triglycerides: 85 mg/dL (ref 0–149)
VLDL Cholesterol Cal: 16 mg/dL (ref 5–40)

## 2020-02-27 ENCOUNTER — Telehealth: Payer: Self-pay | Admitting: Family Medicine

## 2020-02-27 DIAGNOSIS — M810 Age-related osteoporosis without current pathological fracture: Secondary | ICD-10-CM | POA: Diagnosis not present

## 2020-02-27 NOTE — Telephone Encounter (Signed)
Pt would like to discuss blood work

## 2020-02-27 NOTE — Telephone Encounter (Signed)
Return to lab result.

## 2020-03-01 ENCOUNTER — Telehealth: Payer: Self-pay

## 2020-03-01 NOTE — Telephone Encounter (Signed)
Please review results in chart and advise

## 2020-03-04 ENCOUNTER — Encounter: Payer: Self-pay | Admitting: *Deleted

## 2020-03-24 ENCOUNTER — Ambulatory Visit (INDEPENDENT_AMBULATORY_CARE_PROVIDER_SITE_OTHER): Payer: Medicare Other | Admitting: Nurse Practitioner

## 2020-03-24 ENCOUNTER — Other Ambulatory Visit: Payer: Self-pay

## 2020-03-24 ENCOUNTER — Encounter: Payer: Self-pay | Admitting: Nurse Practitioner

## 2020-03-24 VITALS — BP 120/61 | HR 73 | Temp 97.0°F | Ht 66.0 in | Wt 142.0 lb

## 2020-03-24 DIAGNOSIS — R197 Diarrhea, unspecified: Secondary | ICD-10-CM | POA: Diagnosis not present

## 2020-03-24 DIAGNOSIS — R110 Nausea: Secondary | ICD-10-CM | POA: Diagnosis not present

## 2020-03-24 MED ORDER — LOPERAMIDE HCL 2 MG PO TABS: 2.0000 mg | ORAL_TABLET | Freq: Four times a day (QID) | ORAL | 0 refills | Status: DC | PRN

## 2020-03-24 MED ORDER — ONDANSETRON HCL 4 MG PO TABS: 4.0000 mg | ORAL_TABLET | Freq: Three times a day (TID) | ORAL | 0 refills | Status: DC | PRN

## 2020-03-24 NOTE — Addendum Note (Signed)
Addended by: Thana Ates on: 03/24/2020 03:59 PM   Modules accepted: Orders

## 2020-03-24 NOTE — Patient Instructions (Signed)
Recommendation-brat diet, reduce spicy food, start patient on antidiarrhea, and antiemetic.  Diarrhea, Adult Diarrhea is when you pass loose and watery poop (stool) often. Diarrhea can make you feel weak and cause you to lose water in your body (get dehydrated). Losing water in your body can cause you to:  Feel tired and thirsty.  Have a dry mouth.  Go pee (urinate) less often. Diarrhea often lasts 2-3 days. However, it can last longer if it is a sign of something more serious. It is important to treat your diarrhea as told by your doctor. Follow these instructions at home: Eating and drinking Follow these instructions as told by your doctor:  Take an ORS (oral rehydration solution). This is a drink that helps you replace fluids and minerals your body lost. It is sold at pharmacies and stores.  Drink plenty of fluids, such as: ? Water. ? Ice chips. ? Diluted fruit juice. ? Low-calorie sports drinks. ? Milk, if you want.  Avoid drinking fluids that have a lot of sugar or caffeine in them.  Eat bland, easy-to-digest foods in small amounts as you are able. These foods include: ? Bananas. ? Applesauce. ? Rice. ? Low-fat (lean) meats. ? Toast. ? Crackers.  Avoid alcohol.  Avoid spicy or fatty foods.      Medicines  Take over-the-counter and prescription medicines only as told by your doctor.  If you were prescribed an antibiotic medicine, take it as told by your doctor. Do not stop using the antibiotic even if you start to feel better. General instructions  Wash your hands often using soap and water. If soap and water are not available, use a hand sanitizer. Others in your home should wash their hands as well. Hands should be washed: ? After using the toilet or changing a diaper. ? Before preparing, cooking, or serving food. ? While caring for a sick person. ? While visiting someone in a hospital.  Drink enough fluid to keep your pee (urine) pale yellow.  Rest at home  while you get better.  Watch your condition for any changes.  Take a warm bath to help with any burning or pain from having diarrhea.  Keep all follow-up visits as told by your doctor. This is important.   Contact a doctor if:  You have a fever.  Your diarrhea gets worse.  You have new symptoms.  You cannot keep fluids down.  You feel light-headed or dizzy.  You have a headache.  You have muscle cramps. Get help right away if:  You have chest pain.  You feel very weak or you pass out (faint).  You have bloody or black poop or poop that looks like tar.  You have very bad pain, cramping, or bloating in your belly (abdomen).  You have trouble breathing or you are breathing very quickly.  Your heart is beating very quickly.  Your skin feels cold and clammy.  You feel confused.  You have signs of losing too much water in your body, such as: ? Dark pee, very little pee, or no pee. ? Cracked lips. ? Dry mouth. ? Sunken eyes. ? Sleepiness. ? Weakness. Summary  Diarrhea is when you pass loose and watery poop (stool) often.  Diarrhea can make you feel weak and cause you to lose water in your body (get dehydrated).  Take an ORS (oral rehydration solution). This is a drink that is sold at pharmacies and stores.  Eat bland, easy-to-digest foods in small amounts as you are  able.  Contact a doctor if your condition gets worse. Get help right away if you have signs that you have lost too much water in your body. This information is not intended to replace advice given to you by your health care provider. Make sure you discuss any questions you have with your health care provider. Document Revised: 08/03/2017 Document Reviewed: 08/03/2017 Elsevier Patient Education  2021 [employer].

## 2020-03-24 NOTE — Progress Notes (Signed)
Acute Office Visit  Subjective:    Patient ID: Craig Baird, male    DOB: [birth year], 87 y.o.   MRN: [id number]  Chief complaint  Cough and congestion  Diarrhea  This is a recurrent problem. The current episode started 1 to 4 weeks ago. The problem occurs 2 to 4 times per day. The problem has been unchanged. The stool consistency is described as watery. The patient states that diarrhea awakens him from sleep. Associated symptoms include abdominal pain. Pertinent negatives include no chills, fever, headaches or vomiting. Nothing aggravates the symptoms. There are no known risk factors. He has tried anti-motility drug for the symptoms. The treatment provided no relief. Diverticulosis     Past Medical History:  Diagnosis Date  . Allergy   . Anxiety   . Cataract   . Diverticulosis   . Hernia    L inguinal hernia  . Hiatal hernia   . Hyperlipidemia   . SCCA (squamous cell carcinoma) of skin 09/22/2019   Right Forearem-posterior (well diff) (treatment after biopsy)  . Sinusitis     Past Surgical History:  Procedure Laterality Date  . COLONOSCOPY N/A 09/15/2016   Dr. Gala Romney: Diverticulosis, several polyps removed, couple of tubular adenomas.  No future surveillance colonoscopies recommended given age.  . ESOPHAGOGASTRODUODENOSCOPY N/A 09/15/2016   Dr. Gala Romney: small hiatal hernia.  . THYROID CYST EXCISION  1973  . TONSILLECTOMY      Family History  Problem Relation Age of Onset  . Hypertension Mother   . Stroke Mother   . Parkinson's disease Mother   . Hip fracture Mother   . Hypertension Brother 6  . Heart disease Brother   . Birth defects Father        spine  . Early death Sister   . Early death Sister   . Colon cancer Neg Hx   . Gastric cancer Neg Hx   . Esophageal cancer Neg Hx     Social History   Socioeconomic History  . Marital status: Married    Spouse name: Thelma   . Number of children: 2  . Years of education: Not on file  . Highest education  level: Not on file  Occupational History  . Not on file  Tobacco Use  . Smoking status: Never Smoker  . Smokeless tobacco: Never Used  Vaping Use  . Vaping Use: Never used  Substance and Sexual Activity  . Alcohol use: No  . Drug use: No  . Sexual activity: Yes  Other Topics Concern  . Not on file  Social History Narrative  . Not on file   Social Determinants of Health   Financial Resource Strain: Not on file  Food Insecurity: Not on file  Transportation Needs: Not on file  Physical Activity: Not on file  Stress: Not on file  Social Connections: Not on file  Intimate Partner Violence: Not on file    Outpatient Medications Prior to Visit  Medication Sig Dispense Refill  . aspirin EC 81 MG tablet Take 1 tablet (81 mg total) by mouth daily. (Patient taking differently: Take 81 mg by mouth as needed.) 100 tablet 11  . atorvastatin (LIPITOR) 20 MG tablet Take 1 tablet (20 mg total) by mouth daily. 90 tablet 3  . fluticasone (FLONASE) 50 MCG/ACT nasal spray Place 2 sprays into both nostrils daily as needed for allergies. 48 g 0  . PARoxetine (PAXIL) 10 MG tablet Take 1 tablet (10 mg total) by mouth daily. 90 tablet 3  No facility-administered medications prior to visit.    Allergies  Allergen Reactions  . Sulfa Antibiotics Rash    Review of Systems  Constitutional: Negative for chills and fever.  HENT: Negative.   Respiratory: Negative.   Gastrointestinal: Positive for abdominal pain and diarrhea. Negative for vomiting.  Genitourinary: Negative.   Skin: Negative.   Neurological: Negative.  Negative for headaches.  Psychiatric/Behavioral: Negative.   All other systems reviewed and are negative.      Objective:    Physical Exam Vitals reviewed.  Constitutional:      Appearance: Normal appearance.  Eyes:     Conjunctiva/sclera: Conjunctivae normal.  Cardiovascular:     Rate and Rhythm: Normal rate and regular rhythm.     Pulses: Normal pulses.     Heart  sounds: Normal heart sounds.  Pulmonary:     Effort: Pulmonary effort is normal.     Breath sounds: Normal breath sounds.  Abdominal:     Tenderness: There is abdominal tenderness.     Comments: Diarrhea  Musculoskeletal:        General: Normal range of motion.  Skin:    General: Skin is warm.  Neurological:     Mental Status: He is alert and oriented to person, place, and time.     BP 120/61   Pulse 73   Temp (!) 97 F (36.1 C)   Ht 5\' 6"  (1.676 m)   Wt 142 lb (64.4 kg)   SpO2 97%   BMI 22.92 kg/m  Wt Readings from Last 3 Encounters:  03/24/20 142 lb (64.4 kg)  02/23/20 141 lb (64 kg)  05/21/19 139 lb 9.6 oz (63.3 kg)    There are no preventive care reminders to display for this patient.  There are no preventive care reminders to display for this patient.   Lab Results  Component Value Date   TSH 1.780 09/29/2016   Lab Results  Component Value Date   WBC 6.7 02/23/2020   HGB 15.0 02/23/2020   HCT 43.3 02/23/2020   MCV 95 02/23/2020   PLT 288 02/23/2020   Lab Results  Component Value Date   NA 144 02/23/2020   K 5.0 02/23/2020   CO2 26 02/23/2020   GLUCOSE 95 02/23/2020   BUN 12 02/23/2020   CREATININE 0.91 02/23/2020   BILITOT 0.4 02/23/2020   ALKPHOS 111 02/23/2020   AST 21 02/23/2020   ALT 16 02/23/2020   PROT 7.2 02/23/2020   ALBUMIN 4.3 02/23/2020   CALCIUM 9.2 02/23/2020   ANIONGAP 5 10/27/2016   Lab Results  Component Value Date   CHOL 167 02/23/2020   Lab Results  Component Value Date   HDL 54 02/23/2020   Lab Results  Component Value Date   LDLCALC 97 02/23/2020   Lab Results  Component Value Date   TRIG 85 02/23/2020   Lab Results  Component Value Date   CHOLHDL 3.1 02/23/2020   Lab Results  Component Value Date   HGBA1C 5.8 11/15/2016       Assessment & Plan:   Problem List Items Addressed This Visit      Other   Nausea without vomiting   Relevant Medications   ondansetron (ZOFRAN) 4 MG tablet   Diarrhea -  Primary    New for patient in the last 4 weeks.  Patient has tried over-the-counter antiemetics with no relief.  Provided education to patient to avoid dairy products, starting the brat diet, Imodium for diarrhea, Zofran for nausea, Tylenol for  pain.  Completed stool cultures results pending.  Rx sent to pharmacy  Follow-up with worsening hours of symptoms.      Relevant Medications   loperamide (IMODIUM A-D) 2 MG tablet   Other Relevant Orders   Stool culture       Meds ordered this encounter  Medications  . loperamide (IMODIUM A-D) 2 MG tablet    Sig: Take 1 tablet (2 mg total) by mouth 4 (four) times daily as needed for diarrhea or loose stools.    Dispense:  30 tablet    Refill:  0    Order Specific Question:   Supervising Provider    Answer:   Janora Norlander [[id number]]  . ondansetron (ZOFRAN) 4 MG tablet    Sig: Take 1 tablet (4 mg total) by mouth every 8 (eight) hours as needed for nausea or vomiting.    Dispense:  20 tablet    Refill:  0    Order Specific Question:   Supervising Provider    Answer:   Janora Norlander [[id number]]     Ivy Lynn, NP

## 2020-03-24 NOTE — Assessment & Plan Note (Signed)
New for patient in the last 4 weeks.  Patient has tried over-the-counter antiemetics with no relief.  Provided education to patient to avoid dairy products, starting the brat diet, Imodium for diarrhea, Zofran for nausea, Tylenol for pain.  Completed stool cultures results pending.  Rx sent to pharmacy  Follow-up with worsening hours of symptoms.

## 2020-03-26 NOTE — Progress Notes (Signed)
1/14 lmtcb mp

## 2020-03-29 LAB — CDIFF NAA+O+P+STOOL CULTURE
E coli, Shiga toxin Assay: NEGATIVE
Toxigenic C. Difficile by PCR: NEGATIVE

## 2020-03-31 ENCOUNTER — Other Ambulatory Visit: Payer: Medicare Other

## 2020-03-31 DIAGNOSIS — Z20822 Contact with and (suspected) exposure to covid-19: Secondary | ICD-10-CM | POA: Diagnosis not present

## 2020-04-02 LAB — NOVEL CORONAVIRUS, NAA: SARS-CoV-2, NAA: NOT DETECTED

## 2020-04-02 LAB — SARS-COV-2, NAA 2 DAY TAT

## 2020-04-05 ENCOUNTER — Telehealth: Payer: Self-pay

## 2020-04-05 NOTE — Telephone Encounter (Signed)
Advised pt's wife COVID results negative.

## 2020-04-06 ENCOUNTER — Other Ambulatory Visit: Payer: Self-pay

## 2020-04-06 ENCOUNTER — Ambulatory Visit: Payer: Medicare Other | Admitting: Dermatology

## 2020-04-06 ENCOUNTER — Telehealth: Payer: Self-pay | Admitting: Dermatology

## 2020-04-06 ENCOUNTER — Encounter: Payer: Self-pay | Admitting: Dermatology

## 2020-04-06 DIAGNOSIS — L309 Dermatitis, unspecified: Secondary | ICD-10-CM

## 2020-04-06 DIAGNOSIS — D485 Neoplasm of uncertain behavior of skin: Secondary | ICD-10-CM | POA: Diagnosis not present

## 2020-04-06 DIAGNOSIS — L111 Transient acantholytic dermatosis [Grover]: Secondary | ICD-10-CM | POA: Diagnosis not present

## 2020-04-06 MED ORDER — TACROLIMUS 0.1 % EX OINT: TOPICAL_OINTMENT | Freq: Two times a day (BID) | CUTANEOUS | 0 refills | Status: DC

## 2020-04-06 MED ORDER — PIMECROLIMUS 1 % EX CREA: TOPICAL_CREAM | CUTANEOUS | 0 refills | Status: DC

## 2020-04-06 NOTE — Patient Instructions (Addendum)
Biopsy, Surgery (Curettage) & Surgery (Excision) Aftercare Instructions  1. Okay to remove bandage in 24 hours  2. Wash area with soap and water  3. Apply Vaseline to area twice daily until healed (Not Neosporin)  4. Okay to cover with a Band-Aid to decrease the chance of infection or prevent irritation from clothing; also it's okay to uncover lesion at home.  5. Suture instructions: return to our office in 7-10 or 10-14 days for a nurse visit for suture removal. Variable healing with sutures, if pain or itching occurs call our office. It's okay to shower or bathe 24 hours after sutures are given.  6. The following risks may occur after a biopsy, curettage or excision: bleeding, scarring, discoloration, recurrence, infection (redness, yellow drainage, pain or swelling).  7. For questions, concerns and results call our office at [facility] before 4pm & Friday before 3pm. Biopsy results will be available in 1 week.  Paperwork signed today for [employer] [place], patient will be on the look out for phone calls form [employer] or [employer].

## 2020-04-06 NOTE — Telephone Encounter (Signed)
Sent prescription to [facility] per patient wife.

## 2020-04-06 NOTE — Telephone Encounter (Signed)
Pimecrolimus Rx was too expensive at local pharmacy. Patient's wife would like Rx sent to [facility] so the medication will at no extra cost to them. The phone number to the mail order pharmacy is [phone].

## 2020-04-08 ENCOUNTER — Encounter: Payer: Self-pay | Admitting: Dermatology

## 2020-04-08 NOTE — Progress Notes (Signed)
Follow-Up Visit   Subjective  Craig Baird is a 87 y.o. male who presents for the following: Skin Problem (Patient here today for itching all over x years getting worse in the last 6 months. Per patient the itching is waking him up at night. No household change, no one else in home has itch, no pets.  Patient has been using TAC and OTC creams that do not help.).  Life altering itchy rash Location: Covers most of torso Duration: Several years but definitely spreading Quality:  Associated Signs/Symptoms: Modifying Factors:  Severity:  Timing: Context:   Objective  Well appearing patient in no apparent distress; mood and affect are within normal limits. Objective  Whole Back, Whole Chest: Craig Baird now has extensive papular dermatitis, 50% of body surface area with most severe involvement of the back and chest.  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  Ill-defined pink papules/plaques with scale-crust.   Images      Objective  Right Mid Back: Multiple biopsies obtained to rule out possibility of CTCL.  Objective  Left Upper Mid Back (2): No photo no measurement per ST    A full examination was performed including scalp, head, eyes, ears, nose, lips, neck, chest, axillae, abdomen, back, buttocks, bilateral upper extremities, bilateral lower extremities, hands, feet, fingers, toes, fingernails, and toenails. All findings within normal limits unless otherwise noted below.   Assessment & Plan    Eczema, unspecified type (2) Whole Chest; Whole Back  Patient will try a different topical therapy and sign paperwork for Dupixent.  We will try to obtain whichever TCI is affordable (tacrolimus or Elidel).  If Dupixent unavailable will consider methotrexate.  pimecrolimus (ELIDEL) 1 % cream - Whole Back, Whole Chest  Neoplasm of uncertain behavior of skin (3) Right Mid Back  Skin /  nail biopsy Type of biopsy: tangential   Informed consent: discussed and consent obtained   Timeout: patient name, date of birth, surgical site, and procedure verified   Procedure prep:  Patient was prepped and draped in usual sterile fashion (Non sterile) Prep type:  Chlorhexidine Anesthesia: the lesion was anesthetized in a standard fashion   Anesthetic:  1% lidocaine w/ epinephrine 1-100,000 local infiltration Instrument used: flexible razor blade   Hemostasis achieved with: ferric subsulfate   Outcome: patient tolerated procedure well   Post-procedure details: sterile dressing applied and wound care instructions given   Dressing type: bandage and petrolatum    Specimen 1 - Surgical pathology Differential Diagnosis: R/O CTCL  Check Margins: No  Left Upper Mid Back (2)  Skin / nail biopsy Type of biopsy: tangential   Informed consent: discussed and consent obtained   Timeout: patient name, date of birth, surgical site, and procedure verified   Procedure prep:  Patient was prepped and draped in usual sterile fashion (Non sterile) Prep type:  Chlorhexidine Anesthesia: the lesion was anesthetized in a standard fashion   Anesthetic:  1% lidocaine w/ epinephrine 1-100,000 local infiltration Instrument used: flexible razor blade   Hemostasis achieved with: ferric subsulfate   Outcome: patient tolerated procedure well   Post-procedure details: sterile dressing applied and wound care instructions given   Dressing type: bandage and petrolatum    Specimen 2 - Surgical pathology Differential Diagnosis: R/O CTCL  Check Margins: No   Over 1 hour spent in eye to eye consultation with Craig Baird.  He was literally begging me to provide relief from his intense pruritus, but  I do believe he completely understands my unwillingness to use long-term systemic steroids as an appropriate strategy.   I, Lavonna Monarch, MD, have reviewed all documentation for this visit.  The documentation on  04/08/20 for the exam, diagnosis, procedures, and orders are all accurate and complete.

## 2020-04-12 ENCOUNTER — Other Ambulatory Visit: Payer: Self-pay

## 2020-04-12 ENCOUNTER — Encounter: Payer: Self-pay | Admitting: Nurse Practitioner

## 2020-04-12 ENCOUNTER — Ambulatory Visit (INDEPENDENT_AMBULATORY_CARE_PROVIDER_SITE_OTHER): Payer: Medicare Other | Admitting: Nurse Practitioner

## 2020-04-12 ENCOUNTER — Telehealth: Payer: Self-pay | Admitting: Dermatology

## 2020-04-12 VITALS — BP 139/77 | HR 71 | Temp 97.4°F | Resp 20 | Ht 66.0 in | Wt 142.0 lb

## 2020-04-12 DIAGNOSIS — M1711 Unilateral primary osteoarthritis, right knee: Secondary | ICD-10-CM | POA: Diagnosis not present

## 2020-04-12 DIAGNOSIS — M1712 Unilateral primary osteoarthritis, left knee: Secondary | ICD-10-CM | POA: Diagnosis not present

## 2020-04-12 DIAGNOSIS — M1990 Unspecified osteoarthritis, unspecified site: Secondary | ICD-10-CM

## 2020-04-12 MED ORDER — METHYLPREDNISOLONE ACETATE 40 MG/ML IJ SUSP
40.0000 mg | Freq: Once | INTRAMUSCULAR | Status: AC
  Administered 2020-04-12: 40 mg via INTRAMUSCULAR

## 2020-04-12 MED ORDER — BUPIVACAINE HCL 0.25 % IJ SOLN
1.0000 mL | Freq: Once | INTRAMUSCULAR | Status: AC
  Administered 2020-04-12: 1 mL via INTRA_ARTICULAR

## 2020-04-12 NOTE — Telephone Encounter (Signed)
Phone call to [facility] to cancel the patient's prescription. Spoke with Alice with [facility] to cancel patient's prescription. Per Danton Clap the prescription is cancelled.

## 2020-04-12 NOTE — Progress Notes (Signed)
   Subjective:    Patient ID: Craig Baird, male    DOB: 05/31/1933, 87 y.o.   MRN: [id number]   Chief Complaint: Bilateral knee pain   HPI Patient come is c/o bil knee pain, has documented arthritis. He has had an injection in left knee before but never in right knee. They hurt when he is up walking and have occasional fluid on them. Rates pain around 5/10 currently pain increases with walking.  Review of Systems  Musculoskeletal: Positive for arthralgias (bil knee).  All other systems reviewed and are negative.       Objective:   Physical Exam Vitals and nursing note reviewed.  Constitutional:      Appearance: Normal appearance.  Cardiovascular:     Rate and Rhythm: Normal rate and regular rhythm.     Heart sounds: Normal heart sounds.  Musculoskeletal:     Comments: Gait normal No knee effusion bil FROM of bil knees with crepitus on flexion and extension.  Skin:    General: Skin is warm.  Neurological:     General: No focal deficit present.     Mental Status: He is alert and oriented to person, place, and time.  Psychiatric:        Mood and Affect: Mood normal.        Behavior: Behavior normal.    BP 139/77   Pulse 71   Temp (!) 97.4 F (36.3 C) (Temporal)   Resp 20   Ht 5\' 6"  (1.676 m)   Wt 142 lb (64.4 kg)   SpO2 96%   BMI 22.92 kg/m   Joint Injection/Arthrocentesis  Date/Time: 04/12/2020 3:47 PM Performed by: Chevis Pretty, FNP Authorized by: Hassell Done Mary-Margaret, FNP  Indications: diagnostic evaluation and pain  Body area: knee Joint: right knee Local anesthesia used: no  Anesthesia: Local anesthesia used: no  Sedation: Patient sedated: no  Preparation: Patient was prepped and draped in the usual sterile fashion. Needle size: 22 G Ultrasound guidance: no Approach: inferior Methylprednisolone amount: 40 mg Bupivacaine 0.25% amount: 1 mL Patient tolerance: patient tolerated the procedure well with no immediate  complications  Joint Injection/Arthrocentesis  Date/Time: 04/12/2020 3:48 PM Performed by: Chevis Pretty, FNP Authorized by: Hassell Done Mary-Margaret, FNP  Indications: pain  Body area: knee Joint: left knee Local anesthesia used: no  Anesthesia: Local anesthesia used: no  Sedation: Patient sedated: no  Needle size: 22 G Ultrasound guidance: no Approach: inferior Methylprednisolone amount: 40 mg Bupivacaine 0.25% amount: 1 mL Patient tolerance: patient tolerated the procedure well with no immediate complications        Assessment & Plan:  [name] in today with chief complaint of Bilateral knee pain   1. Arthritis *bil knee injection Rest  Ice BID Compression wrap if helps elevate legs when sitting    The above assessment and management plan was discussed with the patient. The patient verbalized understanding of and has agreed to the management plan. Patient is aware to call the clinic if symptoms persist or worsen. Patient is aware when to return to the clinic for a follow-up visit. Patient educated on when it is appropriate to go to the emergency department.   Mary-Margaret Hassell Done, FNP

## 2020-04-12 NOTE — Telephone Encounter (Signed)
Spoke with Kendra Opitz via the [facility] Portal to cancel patient's Dupixent prescription.  Per Kendra Opitz with [employer] support the prescription is cancelled.

## 2020-04-12 NOTE — Telephone Encounter (Signed)
-----   Message from Lavonna Monarch, MD sent at 04/10/2020  9:29 AM EST ----- Although biopsy once again supports a diagnosis of [place] disease.  This may respond to [place] as with atopic dermatitis, so if this proves to be available that would be my treatment choice.  If this is unobtainable, I will discuss with Craig Baird once again a trial with low-dose methotrexate or etretinate.

## 2020-04-12 NOTE — Patient Instructions (Signed)
Joint Steroid Injection A joint steroid injection is a procedure to relieve swelling and pain in a joint. Steroids are medicines that reduce inflammation. In this procedure, your health care provider uses a syringe and a needle to inject a steroid medicine into a painful and inflamed joint. A pain-relieving medicine (anesthetic) may be injected along with the steroid. In some cases, your health care provider may use an imaging technique such as ultrasound or fluoroscopy to guide the injection. Joints that are often treated with steroid injections include the knee, shoulder, hip, and spine. These injections may also be used in the elbow, ankle, and joints of the hands or feet. You may have joint steroid injections as part of your treatment for inflammation caused by:  Gout.  Rheumatoid arthritis.  Advanced wear-and-tear arthritis (osteoarthritis).  Tendinitis.  Bursitis. Joint steroid injections may be repeated, but having them too often can damage a joint or the skin over the joint. You should not have joint steroid injections less than 6 weeks apart or more than four times a year. Tell a health care provider about:  Any allergies you have.  All medicines you are taking, including vitamins, herbs, eye drops, creams, and over-the-counter medicines.  Any problems you or family members have had with anesthetic medicines.  Any blood disorders you have.  Any surgeries you have had.  Any medical conditions you have.  Whether you are pregnant or may be pregnant. What are the risks? Generally, this is a safe treatment. However, problems may occur, including:  Infection.  Bleeding.  Allergic reactions to medicines.  Damage to the joint or tissues around the joint.  Thinning of skin or loss of skin color over the joint.  Temporary flushing of the face or chest.  Temporary increase in pain.  Temporary increase in blood sugar.  Failure to relieve inflammation or pain. What  happens before the treatment? Medicines Ask your health care provider about:  Changing or stopping your regular medicines. This is especially important if you are taking diabetes medicines or blood thinners.  Taking medicines such as aspirin and ibuprofen. These medicines can thin your blood. Do not take these medicines unless your health care provider tells you to take them.  Taking over-the-counter medicines, vitamins, herbs, and supplements. General instructions  You may have imaging tests of your joint.  Ask your health care provider if you can drive yourself home after the procedure. What happens during the treatment?  Your health care provider will position you for the injection and locate the injection site over your joint.  The skin over the joint will be cleaned with a germ-killing soap.  Your health care provider may: ? Spray a numbing solution (topical anesthetic) over the injection site. ? Inject a local anesthetic under the skin above your joint.  The needle will be placed through your skin into your joint. Your health care provider may use imaging to guide the needle to the right spot for the injection. If imaging is used, a special contrast dye may be injected to confirm that the needle is in the correct location.  The steroid medicine will be injected into your joint.  Anesthetic may be injected along with the steroid. This may be a medicine that relieves pain for a short time (short-acting anesthetic) or for a longer time (long-acting anesthetic).  The needle will be removed, and an adhesive bandage (dressing) will be placed over the injection site. The procedure may vary among health care providers and hospitals.     What can I expect after the treatment?  You will be able to go home after the treatment.  It is normal to feel slight flushing for a few days after the injection.  After the treatment, it is common to have an increase in joint pain after the  anesthetic has worn off. This may happen about an hour after a short-acting anesthetic or about 8 hours after a longer-acting anesthetic.  You should begin to feel relief from joint pain and swelling after 24 to 48 hours. Contact your health care provider if you do not begin to feel relief after 2 days. Follow these instructions at home: Injection site care  Leave the adhesive dressing over your injection site in place until your health care provider says you can remove it.  Check your injection site every day for signs of infection. Check for: ? More redness, swelling, or pain. ? Fluid or blood. ? Warmth. ? Pus or a bad smell. Activity  Return to your normal activities as told by your health care provider. Ask your health care provider what activities are safe for you. You may be asked to limit activities that put stress on the joint for a few days.  Do joint exercises as told by your health care provider.  Do not take baths, swim, or use a hot tub until your health care provider approves. Ask your health care provider if you may take showers. You may only be allowed to take sponge baths. Managing pain, stiffness, and swelling  If directed, put ice on the joint. To do this: ? Put ice in a plastic bag. ? Place a towel between your skin and the bag. ? Leave the ice on for 20 minutes, 2-3 times a day. ? Remove the ice if your skin turns bright red. This is very important. If you cannot feel pain, heat, or cold, you have a greater risk of damage to the area.  Raise (elevate) your joint above the level of your heart when you are sitting or lying down.   General instructions  Take over-the-counter and prescription medicines only as told by your health care provider.  Do not use any products that contain nicotine or tobacco, such as cigarettes, e-cigarettes, and chewing tobacco. These can delay joint healing. If you need help quitting, ask your health care provider.  If you have  diabetes, be aware that your blood sugar may be slightly elevated for several days after the injection.  Keep all follow-up visits. This is important. Contact a health care provider if you have:  Chills or a fever.  Any signs of infection at your injection site.  Increased pain or swelling or no relief after 2 days. Summary  A joint steroid injection is a treatment to relieve pain and swelling in a joint.  Steroids are medicines that reduce inflammation. Your health care provider may add an anesthetic along with the steroid.  You may have joint steroid injections as part of your arthritis treatment.  Joint steroid injections may be repeated, but having them too often can damage a joint or the skin over the joint.  Contact your health care provider if you have a fever, chills, or signs of infection, or if you get no relief from joint pain or swelling. This information is not intended to replace advice given to you by your health care provider. Make sure you discuss any questions you have with your health care provider. Document Revised: 08/08/2019 Document Reviewed: 08/08/2019 Elsevier Patient Education    2021 [employer]vier Inc.  

## 2020-04-12 NOTE — Telephone Encounter (Signed)
Phone call to patient with his pathology results and Dr. Onalee Hua recommendations.  Patient aware, patient states that he would like to hold off on starting Dupixent at this time.  Per patient he's not as bad as he's been in the past, so he doesn't want to start new treatment.

## 2020-04-12 NOTE — Telephone Encounter (Signed)
Results

## 2020-04-19 ENCOUNTER — Other Ambulatory Visit: Payer: Self-pay | Admitting: Nurse Practitioner

## 2020-04-19 DIAGNOSIS — R197 Diarrhea, unspecified: Secondary | ICD-10-CM

## 2020-04-22 ENCOUNTER — Ambulatory Visit: Payer: Medicare Other | Admitting: Dermatology

## 2020-05-07 ENCOUNTER — Other Ambulatory Visit: Payer: Self-pay

## 2020-06-04 ENCOUNTER — Other Ambulatory Visit: Payer: Self-pay | Admitting: Family Medicine

## 2020-06-04 DIAGNOSIS — E785 Hyperlipidemia, unspecified: Secondary | ICD-10-CM

## 2020-06-09 ENCOUNTER — Telehealth: Payer: Self-pay

## 2020-06-09 DIAGNOSIS — F411 Generalized anxiety disorder: Secondary | ICD-10-CM

## 2020-06-09 MED ORDER — PAROXETINE HCL 10 MG PO TABS: 10.0000 mg | ORAL_TABLET | Freq: Every day | ORAL | 2 refills | Status: DC

## 2020-06-09 NOTE — Telephone Encounter (Signed)
Aware Rx sent to [facility]

## 2020-06-09 NOTE — Telephone Encounter (Signed)
  Prescription Request  06/09/2020  What is the name of the medication or equipment? Taroxetine 10mg   Have you contacted your pharmacy to request a refill? (if applicable) no  Which pharmacy would you like this sent to? [facility] mail order   Patient notified that their request is being sent to the clinical staff for review and that they should receive a response within 2 business days.   [facility] pt.  Please call pt.

## 2020-07-29 ENCOUNTER — Ambulatory Visit (INDEPENDENT_AMBULATORY_CARE_PROVIDER_SITE_OTHER): Payer: Medicare Other | Admitting: Family Medicine

## 2020-07-29 ENCOUNTER — Ambulatory Visit (INDEPENDENT_AMBULATORY_CARE_PROVIDER_SITE_OTHER): Payer: Medicare Other

## 2020-07-29 ENCOUNTER — Encounter: Payer: Self-pay | Admitting: Family Medicine

## 2020-07-29 ENCOUNTER — Other Ambulatory Visit: Payer: Self-pay

## 2020-07-29 VITALS — BP 130/74 | HR 76 | Ht 64.0 in | Wt 136.0 lb

## 2020-07-29 DIAGNOSIS — M25562 Pain in left knee: Secondary | ICD-10-CM | POA: Diagnosis not present

## 2020-07-29 DIAGNOSIS — M25512 Pain in left shoulder: Secondary | ICD-10-CM

## 2020-07-29 DIAGNOSIS — M25561 Pain in right knee: Secondary | ICD-10-CM | POA: Diagnosis not present

## 2020-07-29 DIAGNOSIS — G8929 Other chronic pain: Secondary | ICD-10-CM

## 2020-07-29 DIAGNOSIS — M5136 Other intervertebral disc degeneration, lumbar region: Secondary | ICD-10-CM | POA: Diagnosis not present

## 2020-07-29 DIAGNOSIS — E785 Hyperlipidemia, unspecified: Secondary | ICD-10-CM | POA: Diagnosis not present

## 2020-07-29 DIAGNOSIS — M545 Low back pain, unspecified: Secondary | ICD-10-CM

## 2020-07-29 DIAGNOSIS — M19012 Primary osteoarthritis, left shoulder: Secondary | ICD-10-CM | POA: Diagnosis not present

## 2020-07-29 DIAGNOSIS — M1711 Unilateral primary osteoarthritis, right knee: Secondary | ICD-10-CM | POA: Diagnosis not present

## 2020-07-29 DIAGNOSIS — M47816 Spondylosis without myelopathy or radiculopathy, lumbar region: Secondary | ICD-10-CM | POA: Diagnosis not present

## 2020-07-29 MED ORDER — ESCITALOPRAM OXALATE 10 MG PO TABS: 10.0000 mg | ORAL_TABLET | Freq: Every day | ORAL | 3 refills | Status: DC

## 2020-07-29 MED ORDER — ATORVASTATIN CALCIUM 20 MG PO TABS: 20.0000 mg | ORAL_TABLET | Freq: Every day | ORAL | 3 refills | Status: DC

## 2020-07-29 MED ORDER — DICLOFENAC SODIUM 1 % EX GEL: 2.0000 g | Freq: Four times a day (QID) | CUTANEOUS | 3 refills | Status: DC

## 2020-07-29 MED ORDER — FLUTICASONE PROPIONATE 50 MCG/ACT NA SUSP: 1.0000 | Freq: Two times a day (BID) | NASAL | 6 refills | Status: DC | PRN

## 2020-07-29 NOTE — Progress Notes (Signed)
BP 130/74   Pulse 76   Ht 5\' 4"  (1.626 m)   Wt 136 lb (61.7 kg)   SpO2 96%   BMI 23.34 kg/m    Subjective:   Patient ID: Craig Baird, male    DOB: 1933-09-03, 87 y.o.   MRN: [id number]  HPI: Craig Baird is a 87 y.o. male presenting on 07/29/2020 for Knee Pain (bilateral)   HPI Bilateral knee osteoarthritis Patient has both knees pain and wobbly and soreness, right is worse than left. He did not do well with injections but doesn't want replacement.  Patient says is been bothering him but is not severe but does feel like he is getting a little more wobbly and that it does give him a little bit.  He did injections of both knees before and felt like he got no benefit from them.  Back pain Patient is coming in with back pain that is been chronic and mild but not severe but wanted to bring up and have it checked out.  He says is both sides of his lower back.  He denies any numbness or weakness.  Right shoulder pain chronic Patient has right chronic shoulder pain that is more of a nagging irritating scents.  He denies any numbness or weakness or giving way.  He is able to move his shoulder in all directions and range of motion is good.  He is wanting to check to see if there was anything that he needed to do or change.  Relevant past medical, surgical, family and social history reviewed and updated as indicated. Interim medical history since our last visit reviewed. Allergies and medications reviewed and updated.  Review of Systems  Constitutional: Negative for chills and fever.  Eyes: Negative for visual disturbance.  Respiratory: Negative for shortness of breath and wheezing.   Cardiovascular: Negative for chest pain and leg swelling.  Musculoskeletal: Positive for arthralgias. Negative for back pain, gait problem, joint swelling and myalgias.  Skin: Negative for rash.  Neurological: Negative for dizziness, weakness and light-headedness.  All other systems reviewed and are  negative.   Per HPI unless specifically indicated above   Allergies as of 07/29/2020      Reactions   Sulfa Antibiotics Rash      Medication List       Accurate as of Jul 29, 2020  3:30 PM. If you have any questions, ask your nurse or doctor.        STOP taking these medications   aspirin EC 81 MG tablet Stopped by: Worthy Rancher, MD   PARoxetine 10 MG tablet Commonly known as: Paxil Stopped by: Fransisca Kaufmann Pat Sires, MD     TAKE these medications   atorvastatin 20 MG tablet Commonly known as: LIPITOR Take 1 tablet (20 mg total) by mouth daily.   diclofenac Sodium 1 % Gel Commonly known as: Voltaren Apply 2 g topically 4 (four) times daily. Started by: Worthy Rancher, MD   escitalopram 10 MG tablet Commonly known as: Lexapro Take 1 tablet (10 mg total) by mouth daily. Started by: Fransisca Kaufmann Godfrey Tritschler, MD   fluticasone 50 MCG/ACT nasal spray Commonly known as: FLONASE Place 1 spray into both nostrils 2 (two) times daily as needed for allergies or rhinitis. Started by: Fransisca Kaufmann Diamone Whistler, MD        Objective:   BP 130/74   Pulse 76   Ht 5\' 4"  (1.626 m)   Wt 136 lb (61.7 kg)   SpO2  96%   BMI 23.34 kg/m   Wt Readings from Last 3 Encounters:  07/29/20 136 lb (61.7 kg)  04/12/20 142 lb (64.4 kg)  03/24/20 142 lb (64.4 kg)    Physical Exam Vitals and nursing note reviewed.  Constitutional:      General: He is not in acute distress.    Appearance: He is well-developed. He is not diaphoretic.  Eyes:     General: No scleral icterus.    Conjunctiva/sclera: Conjunctivae normal.  Neck:     Thyroid: No thyromegaly.  Musculoskeletal:        General: Normal range of motion.     Right shoulder: No tenderness or bony tenderness. Normal range of motion. Normal strength.     Cervical back: Neck supple.     Lumbar back: Tenderness (Bilateral paraspinal tenderness, mild) present. No bony tenderness. Normal range of motion. Negative right straight leg raise  test and negative left straight leg raise test.     Right knee: Crepitus present. No bony tenderness. Normal range of motion. Tenderness present over the lateral joint line. No LCL laxity, MCL laxity, ACL laxity or PCL laxity. Normal alignment and normal meniscus.     Instability Tests: Anterior drawer test negative. Anterior Lachman test negative. Medial McMurray test negative and lateral McMurray test negative.     Left knee: Crepitus present. No bony tenderness. Normal range of motion. Tenderness present over the medial joint line. No LCL laxity, MCL laxity, ACL laxity or PCL laxity.Normal alignment and normal meniscus.     Instability Tests: Anterior drawer test negative. Anterior Lachman test negative. Medial McMurray test negative and lateral McMurray test negative.  Lymphadenopathy:     Cervical: No cervical adenopathy.  Skin:    General: Skin is warm and dry.     Findings: No rash.  Neurological:     Mental Status: He is alert and oriented to person, place, and time.     Coordination: Coordination normal.  Psychiatric:        Behavior: Behavior normal.       Assessment & Plan:   Problem List Items Addressed This Visit      Other   Hyperlipidemia - Primary   Relevant Medications   atorvastatin (LIPITOR) 20 MG tablet    Other Visit Diagnoses    Chronic pain of both knees       Relevant Medications   escitalopram (LEXAPRO) 10 MG tablet   Other Relevant Orders   DG Knee 1-2 Views Left   DG Knee 1-2 Views Right   Chronic midline low back pain, unspecified whether sciatica present       Relevant Orders   DG Lumbar Spine 2-3 Views   Chronic left shoulder pain       Relevant Medications   escitalopram (LEXAPRO) 10 MG tablet   Other Relevant Orders   DG Shoulder Left   DDD (degenerative disc disease), lumbar          Recommended conservative management for now, patient did not want to go see an orthopedic.  Injections did not help in the knees before.  We discussed  options and he would rather just wait on it and try the Voltaren gel and follow-up as needed. Follow up plan: Return if symptoms worsen or fail to improve.  Counseling provided for all of the vaccine components Orders Placed This Encounter  Procedures  . DG Knee 1-2 Views Left  . DG Knee 1-2 Views Right  . DG Shoulder Left  .  DG Lumbar Spine 2-3 Views    Caryl Pina, MD [facility] 07/29/2020, 3:30 PM

## 2020-08-16 DIAGNOSIS — M9905 Segmental and somatic dysfunction of pelvic region: Secondary | ICD-10-CM | POA: Diagnosis not present

## 2020-08-16 DIAGNOSIS — M9903 Segmental and somatic dysfunction of lumbar region: Secondary | ICD-10-CM | POA: Diagnosis not present

## 2020-08-16 DIAGNOSIS — M9904 Segmental and somatic dysfunction of sacral region: Secondary | ICD-10-CM | POA: Diagnosis not present

## 2020-08-16 DIAGNOSIS — M5431 Sciatica, right side: Secondary | ICD-10-CM | POA: Diagnosis not present

## 2020-08-18 DIAGNOSIS — M5431 Sciatica, right side: Secondary | ICD-10-CM | POA: Diagnosis not present

## 2020-08-18 DIAGNOSIS — M9903 Segmental and somatic dysfunction of lumbar region: Secondary | ICD-10-CM | POA: Diagnosis not present

## 2020-08-18 DIAGNOSIS — M9904 Segmental and somatic dysfunction of sacral region: Secondary | ICD-10-CM | POA: Diagnosis not present

## 2020-08-18 DIAGNOSIS — M9905 Segmental and somatic dysfunction of pelvic region: Secondary | ICD-10-CM | POA: Diagnosis not present

## 2020-08-19 DIAGNOSIS — M9905 Segmental and somatic dysfunction of pelvic region: Secondary | ICD-10-CM | POA: Diagnosis not present

## 2020-08-19 DIAGNOSIS — M9903 Segmental and somatic dysfunction of lumbar region: Secondary | ICD-10-CM | POA: Diagnosis not present

## 2020-08-19 DIAGNOSIS — M9904 Segmental and somatic dysfunction of sacral region: Secondary | ICD-10-CM | POA: Diagnosis not present

## 2020-08-19 DIAGNOSIS — M5431 Sciatica, right side: Secondary | ICD-10-CM | POA: Diagnosis not present

## 2020-08-23 DIAGNOSIS — M9904 Segmental and somatic dysfunction of sacral region: Secondary | ICD-10-CM | POA: Diagnosis not present

## 2020-08-23 DIAGNOSIS — M9903 Segmental and somatic dysfunction of lumbar region: Secondary | ICD-10-CM | POA: Diagnosis not present

## 2020-08-23 DIAGNOSIS — M5431 Sciatica, right side: Secondary | ICD-10-CM | POA: Diagnosis not present

## 2020-08-23 DIAGNOSIS — M9905 Segmental and somatic dysfunction of pelvic region: Secondary | ICD-10-CM | POA: Diagnosis not present

## 2020-12-29 ENCOUNTER — Telehealth: Payer: Self-pay | Admitting: Family Medicine

## 2020-12-29 NOTE — Telephone Encounter (Signed)
No answer unable to leave a message for patient to call back and schedule Medicare Annual Wellness Visit (AWV) to be completed by video or phone.   Last AWV: 11/29/2017  Please schedule at anytime with [facility] Advisor.  45 minute appointment  Any questions, please contact me at [phone]

## 2021-01-28 ENCOUNTER — Ambulatory Visit: Payer: Medicare Other | Admitting: Family Medicine

## 2021-02-23 ENCOUNTER — Encounter: Payer: Self-pay | Admitting: Family Medicine

## 2021-02-23 ENCOUNTER — Ambulatory Visit (INDEPENDENT_AMBULATORY_CARE_PROVIDER_SITE_OTHER): Payer: Medicare Other | Admitting: Family Medicine

## 2021-02-23 VITALS — BP 139/69 | HR 64 | Ht 64.0 in | Wt 140.0 lb

## 2021-02-23 DIAGNOSIS — E785 Hyperlipidemia, unspecified: Secondary | ICD-10-CM

## 2021-02-23 DIAGNOSIS — Z0001 Encounter for general adult medical examination with abnormal findings: Secondary | ICD-10-CM | POA: Diagnosis not present

## 2021-02-23 DIAGNOSIS — F411 Generalized anxiety disorder: Secondary | ICD-10-CM | POA: Diagnosis not present

## 2021-02-23 DIAGNOSIS — Z0000 Encounter for general adult medical examination without abnormal findings: Secondary | ICD-10-CM | POA: Diagnosis not present

## 2021-02-23 DIAGNOSIS — Z125 Encounter for screening for malignant neoplasm of prostate: Secondary | ICD-10-CM | POA: Diagnosis not present

## 2021-02-23 MED ORDER — ESCITALOPRAM OXALATE 10 MG PO TABS: 10.0000 mg | ORAL_TABLET | Freq: Every day | ORAL | 3 refills | Status: DC

## 2021-02-23 MED ORDER — ATORVASTATIN CALCIUM 20 MG PO TABS: 20.0000 mg | ORAL_TABLET | Freq: Every day | ORAL | 3 refills | Status: DC

## 2021-02-23 NOTE — Progress Notes (Signed)
BP 139/69    Pulse 64    Ht 5' 4"  (1.626 m)    Wt 140 lb (63.5 kg)    SpO2 97%    BMI 24.03 kg/m    Subjective:   Patient ID: Craig Baird, male    DOB: 1933/08/24, 87 y.o.   MRN: [id number]  HPI: Craig Baird is a 87 y.o. male presenting on 02/23/2021 for Medical Management of Chronic Issues (CPE) and Hyperlipidemia   HPI Physical Exam Patient denies any chest pain, shortness of breath, headaches or vision issues, abdominal complaints, diarrhea, nausea, vomiting, or joint issues.  He gets the occasional back pain.  This is not changed and its been something that he has for some time.  Patient says his anxiety is under control and he takes Lexapro and says that it does still help and he does pretty good on it.  Hyperlipidemia Patient is coming in for recheck of his hyperlipidemia. The patient is currently taking atorvastatin. They deny any issues with myalgias or history of liver damage from it. They deny any focal numbness or weakness or chest pain.   Relevant past medical, surgical, family and social history reviewed and updated as indicated. Interim medical history since our last visit reviewed. Allergies and medications reviewed and updated.  Review of Systems  Constitutional:  Negative for chills and fever.  HENT:  Negative for ear pain and tinnitus.   Eyes:  Negative for pain and discharge.  Respiratory:  Negative for cough, shortness of breath and wheezing.   Cardiovascular:  Negative for chest pain, palpitations and leg swelling.  Gastrointestinal:  Negative for abdominal pain, blood in stool, constipation and diarrhea.  Genitourinary:  Negative for dysuria and hematuria.  Musculoskeletal:  Negative for back pain, gait problem and myalgias.  Skin:  Negative for rash.  Neurological:  Negative for dizziness, weakness and headaches.  Psychiatric/Behavioral:  Negative for suicidal ideas.   All other systems reviewed and are negative.  Per HPI unless specifically  indicated above   Allergies as of 02/23/2021       Reactions   Sulfa Antibiotics Rash        Medication List        Accurate as of February 23, 2021 10:44 AM. If you have any questions, ask your nurse or doctor.          atorvastatin 20 MG tablet Commonly known as: LIPITOR Take 1 tablet (20 mg total) by mouth daily.   diclofenac Sodium 1 % Gel Commonly known as: Voltaren Apply 2 g topically 4 (four) times daily.   escitalopram 10 MG tablet Commonly known as: Lexapro Take 1 tablet (10 mg total) by mouth daily.   fluticasone 50 MCG/ACT nasal spray Commonly known as: FLONASE Place 1 spray into both nostrils 2 (two) times daily as needed for allergies or rhinitis.         Objective:   BP 139/69    Pulse 64    Ht 5' 4"  (1.626 m)    Wt 140 lb (63.5 kg)    SpO2 97%    BMI 24.03 kg/m   Wt Readings from Last 3 Encounters:  02/23/21 140 lb (63.5 kg)  07/29/20 136 lb (61.7 kg)  04/12/20 142 lb (64.4 kg)    Physical Exam Vitals reviewed.  Constitutional:      General: He is not in acute distress.    Appearance: He is well-developed. He is not diaphoretic.  HENT:     Right Ear:  External ear normal.     Left Ear: External ear normal.     Nose: Nose normal.     Mouth/Throat:     Pharynx: No oropharyngeal exudate.  Eyes:     General: No scleral icterus.       Right eye: No discharge.     Conjunctiva/sclera: Conjunctivae normal.     Pupils: Pupils are equal, round, and reactive to light.  Neck:     Thyroid: No thyromegaly.  Cardiovascular:     Rate and Rhythm: Normal rate and regular rhythm.     Heart sounds: Normal heart sounds. No murmur heard. Pulmonary:     Effort: Pulmonary effort is normal. No respiratory distress.     Breath sounds: Normal breath sounds. No wheezing.  Abdominal:     General: Bowel sounds are normal. There is no distension.     Palpations: Abdomen is soft.     Tenderness: There is no abdominal tenderness. There is no guarding or  rebound.  Musculoskeletal:        General: Normal range of motion.     Cervical back: Neck supple.  Lymphadenopathy:     Cervical: No cervical adenopathy.  Skin:    General: Skin is warm and dry.     Findings: No rash.  Neurological:     Mental Status: He is alert and oriented to person, place, and time.     Coordination: Coordination normal.  Psychiatric:        Behavior: Behavior normal.      Assessment & Plan:   Problem List Items Addressed This Visit       Other   GAD (generalized anxiety disorder)   Relevant Medications   escitalopram (LEXAPRO) 10 MG tablet   Hyperlipidemia   Relevant Medications   atorvastatin (LIPITOR) 20 MG tablet   Other Relevant Orders   Lipid panel   Other Visit Diagnoses     Physical exam    -  Primary   Relevant Orders   CBC with Differential/Platelet   CMP14+EGFR   Lipid panel   PSA, total and free   Prostate cancer screening       Relevant Orders   PSA, total and free       Continue current medicine, no changes.  We will check blood work. Follow up plan: Return in about 1 year (around 02/23/2022), or if symptoms worsen or fail to improve, for Physical exam and cholesterol recheck.  Counseling provided for all of the vaccine components Orders Placed This Encounter  Procedures   CBC with Differential/Platelet   CMP14+EGFR   Lipid panel   PSA, total and free     Caryl Pina, MD [facility] 02/23/2021, 10:44 AM

## 2021-02-24 LAB — CBC WITH DIFFERENTIAL/PLATELET
Basophils Absolute: 0.1 10*3/uL (ref 0.0–0.2)
Basos: 1 %
EOS (ABSOLUTE): 0.3 10*3/uL (ref 0.0–0.4)
Eos: 4 %
Hematocrit: 42.7 % (ref 37.5–51.0)
Hemoglobin: 14.9 g/dL (ref 13.0–17.7)
Immature Grans (Abs): 0 10*3/uL (ref 0.0–0.1)
Immature Granulocytes: 0 %
Lymphocytes Absolute: 1.7 10*3/uL (ref 0.7–3.1)
Lymphs: 29 %
MCH: 32.5 pg (ref 26.6–33.0)
MCHC: 34.9 g/dL (ref 31.5–35.7)
MCV: 93 fL (ref 79–97)
Monocytes Absolute: 0.5 10*3/uL (ref 0.1–0.9)
Monocytes: 9 %
Neutrophils Absolute: 3.3 10*3/uL (ref 1.4–7.0)
Neutrophils: 57 %
Platelets: 261 10*3/uL (ref 150–450)
RBC: 4.59 x10E6/uL (ref 4.14–5.80)
RDW: 11.9 % (ref 11.6–15.4)
WBC: 5.9 10*3/uL (ref 3.4–10.8)

## 2021-02-24 LAB — CMP14+EGFR
ALT: 19 IU/L (ref 0–44)
AST: 21 IU/L (ref 0–40)
Albumin/Globulin Ratio: 1.5 (ref 1.2–2.2)
Albumin: 4.3 g/dL (ref 3.6–4.6)
Alkaline Phosphatase: 119 IU/L (ref 44–121)
BUN/Creatinine Ratio: 11 (ref 10–24)
BUN: 10 mg/dL (ref 8–27)
Bilirubin Total: 0.4 mg/dL (ref 0.0–1.2)
CO2: 26 mmol/L (ref 20–29)
Calcium: 9.5 mg/dL (ref 8.6–10.2)
Chloride: 104 mmol/L (ref 96–106)
Creatinine, Ser: 0.91 mg/dL (ref 0.76–1.27)
Globulin, Total: 2.8 g/dL (ref 1.5–4.5)
Glucose: 94 mg/dL (ref 70–99)
Potassium: 4.9 mmol/L (ref 3.5–5.2)
Sodium: 145 mmol/L — ABNORMAL HIGH (ref 134–144)
Total Protein: 7.1 g/dL (ref 6.0–8.5)
eGFR: 83 mL/min/{1.73_m2} (ref >59)

## 2021-02-24 LAB — PSA, TOTAL AND FREE
PSA, Free Pct: 20 %
PSA, Free: 0.42 ng/mL
Prostate Specific Ag, Serum: 2.1 ng/mL (ref 0.0–4.0)

## 2021-02-24 LAB — LIPID PANEL
Chol/HDL Ratio: 2.7 ratio (ref 0.0–5.0)
Cholesterol, Total: 163 mg/dL (ref 100–199)
HDL: 61 mg/dL (ref >39)
LDL Chol Calc (NIH): 88 mg/dL (ref 0–99)
Triglycerides: 76 mg/dL (ref 0–149)
VLDL Cholesterol Cal: 14 mg/dL (ref 5–40)

## 2021-03-01 ENCOUNTER — Emergency Department (HOSPITAL_COMMUNITY)
Admission: EM | Admit: 2021-03-01 | Discharge: 2021-03-01 | Disposition: A | Discharge status: Home / Self Care | Payer: Medicare Other | Attending: Emergency Medicine | Admitting: Emergency Medicine

## 2021-03-01 ENCOUNTER — Ambulatory Visit (INDEPENDENT_AMBULATORY_CARE_PROVIDER_SITE_OTHER): Payer: Medicare Other | Admitting: *Deleted

## 2021-03-01 ENCOUNTER — Encounter (HOSPITAL_COMMUNITY): Payer: Self-pay | Admitting: *Deleted

## 2021-03-01 DIAGNOSIS — R21 Rash and other nonspecific skin eruption: Secondary | ICD-10-CM | POA: Diagnosis not present

## 2021-03-01 DIAGNOSIS — Z0000 Encounter for general adult medical examination without abnormal findings: Secondary | ICD-10-CM | POA: Diagnosis not present

## 2021-03-01 MED ORDER — DIPHENHYDRAMINE HCL 25 MG PO TABS: ORAL_TABLET | ORAL | 0 refills | Status: DC

## 2021-03-01 MED ORDER — PREDNISONE 20 MG PO TABS: 40.0000 mg | ORAL_TABLET | Freq: Every day | ORAL | 0 refills | Status: DC

## 2021-03-01 NOTE — ED Triage Notes (Signed)
C/o itching for years, states he has rash over body

## 2021-03-01 NOTE — Discharge Instructions (Signed)
Take the medication as directed.  The Benadryl may cause drowsiness.  Follow-up with your primary care doctor or your dermatologist.

## 2021-03-01 NOTE — Progress Notes (Signed)
MEDICARE ANNUAL WELLNESS VISIT  03/01/2021  Telephone Visit Disclaimer This Medicare AWV was conducted by telephone due to national recommendations for restrictions regarding the COVID-19 Pandemic (e.g. social distancing).  I verified, using two identifiers, that I am speaking with Craig Baird or their authorized healthcare agent. I discussed the limitations, risks, security, and privacy concerns of performing an evaluation and management service by telephone and the potential availability of an in-person appointment in the future. The patient expressed understanding and agreed to proceed.  Location of Patient: Home  Location of Provider (nurse):  [facility]   Subjective:    Craig Baird is a 87 y.o. male patient of Dettinger, Fransisca Kaufmann, MD who had a Medicare Annual Wellness Visit today via telephone. Craig Baird is retired and lives with his wife [place]. he has 2 children. He reports that he is socially active and does interact with friends/family regularly. He is involved in church. He is moderately physically active and enjoys working around the house.  Patient Care Team: Dettinger, Fransisca Kaufmann, MD as PCP - General (Family Medicine) Lavonna Monarch, MD as Consulting Physician (Dermatology) Daneil Dolin, MD as Consulting Physician (Gastroenterology)  Advanced Directives 03/01/2021 11/29/2017 11/28/2016 10/27/2016 09/15/2016 05/13/2016 11/23/2015  Does Patient Have a Medical Advance Directive? No No No No No No No  Does patient want to make changes to medical advance directive? - - Yes (MAU/Ambulatory/Procedural Areas - Information given) - - - -  Copy of [place] in Chart? - - No - copy requested - - - -  Would patient like information on creating a medical advance directive? No - Patient declined Yes (MAU/Ambulatory/Procedural Areas - Information given) Yes (MAU/Ambulatory/Procedural Areas - Information given) - No - Patient declined No - Patient declined Yes - Educational  materials given    Hospital Utilization Over the Past 12 Months: # of hospitalizations or ER visits: 0 # of surgeries: 0  Review of Systems    Patient reports that his overall health is unchanged compared to last year.  History obtained from the patient and patient chart.  Patient Reported Readings (BP, Pulse, CBG, Weight, etc) none  Pain Assessment Pain : No/denies pain     Current Medications & Allergies (verified) Allergies as of 03/01/2021       Reactions   Sulfa Antibiotics Rash        Medication List        Accurate as of March 01, 2021 10:58 AM. If you have any questions, ask your nurse or doctor.          STOP taking these medications    diclofenac Sodium 1 % Gel Commonly known as: Voltaren       TAKE these medications    atorvastatin 20 MG tablet Commonly known as: LIPITOR Take 1 tablet (20 mg total) by mouth daily.   escitalopram 10 MG tablet Commonly known as: Lexapro Take 1 tablet (10 mg total) by mouth daily.   fluticasone 50 MCG/ACT nasal spray Commonly known as: FLONASE Place 1 spray into both nostrils 2 (two) times daily as needed for allergies or rhinitis.        History (reviewed): Past Medical History:  Diagnosis Date   Allergy    Anxiety    Cataract    Diverticulosis    Hernia    L inguinal hernia   Hiatal hernia    Hyperlipidemia    SCCA (squamous cell carcinoma) of skin 09/22/2019   Right Forearem-posterior (well diff) (  treatment after biopsy)   Sinusitis    Past Surgical History:  Procedure Laterality Date   COLONOSCOPY N/A 09/15/2016   Dr. Gala Romney: Diverticulosis, several polyps removed, couple of tubular adenomas.  No future surveillance colonoscopies recommended given age.   ESOPHAGOGASTRODUODENOSCOPY N/A 09/15/2016   Dr. Gala Romney: small hiatal hernia.   THYROID CYST EXCISION  1973   TONSILLECTOMY     Family History  Problem Relation Age of Onset   Hypertension Mother    Stroke Mother    Parkinson's  disease Mother    Hip fracture Mother    Hypertension Brother 63   Heart disease Brother    Birth defects Father        spine   Early death Sister    Early death Sister    Colon cancer Neg Hx    Gastric cancer Neg Hx    Esophageal cancer Neg Hx    Social History   Socioeconomic History   Marital status: Married    Spouse name: [place]    Number of children: 2   Years of education: Not on file   Highest education level: 7th grade  Occupational History   Not on file  Tobacco Use   Smoking status: Never   Smokeless tobacco: Never  Vaping Use   Vaping Use: Never used  Substance and Sexual Activity   Alcohol use: No   Drug use: No   Sexual activity: Yes  Other Topics Concern   Not on file  Social History Narrative   Retired, lives with wife [place], two grown children. Enjoys working around the house.    Social Determinants of Health   Financial Resource Strain: Not on file  Food Insecurity: Not on file  Transportation Needs: Not on file  Physical Activity: Not on file  Stress: Not on file  Social Connections: Not on file    Activities of Daily Living In your present state of health, do you have any difficulty performing the following activities: 03/01/2021 04/12/2020  Hearing? N N  Vision? N N  Difficulty concentrating or making decisions? N N  Walking or climbing stairs? Y Y  Comment knee pain -  Dressing or bathing? N N  Doing errands, shopping? N N  Preparing Food and eating ? N -  Using the Toilet? N -  In the past six months, have you accidently leaked urine? N -  Do you have problems with loss of bowel control? N -  Managing your Medications? N -  Managing your Finances? N -  Housekeeping or managing your Housekeeping? N -  Some recent data might be hidden    Patient Education/ Literacy How often do you need to have someone help you when you read instructions, pamphlets, or other written materials from your doctor or pharmacy?: 1 - Never What is the  last grade level you completed in school?: 7th  Exercise Current Exercise Habits: The patient does not participate in regular exercise at present, Exercise limited by: orthopedic condition(s) (knee pain)  Diet Patient reports consuming 3 meals a day and 2 snack(s) a day Patient reports that his primary diet is: Regular Patient reports that she does have regular access to food.   Depression Screen PHQ 2/9 Scores 03/01/2021 02/23/2021 07/29/2020 04/12/2020 03/24/2020 02/23/2020 02/20/2019  PHQ - 2 Score 0 0 2 0 0 0 0  PHQ- 9 Score - 4 4 - - - -     Fall Risk Fall Risk  03/01/2021 02/23/2021 07/29/2020 04/12/2020 03/24/2020  Falls  in the past year? 0 0 1 0 0  Number falls in past yr: - - 0 - -  Injury with Fall? - - 0 - -  Comment - - - - -  Risk for fall due to : - - Impaired balance/gait - -  Follow up - - Falls evaluation completed - -     Objective:  Franck C Lickteig seemed alert and oriented and he participated appropriately during our telephone visit.  Blood Pressure Weight BMI  BP Readings from Last 3 Encounters:  02/23/21 139/69  07/29/20 130/74  04/12/20 139/77   Wt Readings from Last 3 Encounters:  02/23/21 140 lb (63.5 kg)  07/29/20 136 lb (61.7 kg)  04/12/20 142 lb (64.4 kg)   BMI Readings from Last 1 Encounters:  02/23/21 24.03 kg/m    *Unable to obtain current vital signs, weight, and BMI due to telephone visit type  Hearing/Vision  Abdulrahim did not seem to have difficulty with hearing/understanding during the telephone conversation Reports that he has had a formal eye exam by an eye care professional within the past year Reports that he has not had a formal hearing evaluation within the past year *Unable to fully assess hearing and vision during telephone visit type  Cognitive Function: 6CIT Screen 03/01/2021  What Year? 0 points  What month? 0 points  What time? 0 points  Count back from 20 0 points  Months in reverse 0 points  Repeat phrase 10 points   Total Score 10   (Normal:0-7, Significant for Dysfunction: >8)  Normal Cognitive Function Screening: No: 10   Immunization & Health Maintenance Record Immunization History  Administered Date(s) Administered   Influenza Split 11/21/2012, 11/16/2019   Influenza, High Dose Seasonal PF 11/13/2013, 11/23/2017, 11/12/2018   Influenza,inj,Quad PF,6+ Mos 12/09/2015   Influenza-Unspecified 11/11/2020   Moderna Sars-Covid-2 Vaccination 05/30/2019, 07/01/2019, 02/12/2020   Pneumococcal Conjugate-13 07/08/2014   Pneumococcal Polysaccharide-23 08/21/2007, 02/11/2011   Pneumococcal-Unspecified 11/29/2014   Tdap 11/23/2010    Health Maintenance  Topic Date Due   COVID-19 Vaccine (4 - Booster for Moderna series) 03/11/2021 (Originally 04/08/2020)   Zoster Vaccines- Shingrix (1 of 2) 05/24/2021 (Originally 02/20/1953)   TETANUS/TDAP  11/11/2023   Pneumonia Vaccine 26+ Years old  Completed   INFLUENZA VACCINE  Completed   HPV VACCINES  Aged Out       Assessment  This is a routine wellness examination for Fiore C Netter.  Health Maintenance: Due or Overdue There are no preventive care reminders to display for this patient.  Audwin C Greth does not need a referral for [employer] Assistance: Care Management:   no Social Work:    no Prescription Assistance:  no Nutrition/Diabetes Education:  no   Plan:  Personalized Goals  Goals Addressed             This Visit's Progress    Patient Stated       03/01/2021 AWV Goal: Keep All Scheduled Appointments  Over the next year, patient will attend all scheduled appointments with their PCP and any specialists that they see.        Personalized Health Maintenance & Screening Recommendations  Shingles vaccine  Lung Cancer Screening Recommended: no (Low Dose CT Chest recommended if Age 45-80 years, 30 pack-year currently smoking OR have quit w/in past 15 years) Hepatitis C Screening recommended: no HIV Screening recommended:  no  Advanced Directives: Written information was not prepared per patient's request.  Referrals & Orders No orders of the defined types  were placed in this encounter.   Follow-up Plan Follow-up with Dettinger, Fransisca Kaufmann, MD as planned   I have personally reviewed and noted the following in the patients chart:   Medical and social history Use of alcohol, tobacco or illicit drugs  Current medications and supplements Functional ability and status Nutritional status Physical activity Advanced directives List of other physicians Hospitalizations, surgeries, and ER visits in previous 12 months Vitals Screenings to include cognitive, depression, and falls Referrals and appointments  In addition, I have reviewed and discussed with Montavious C Kloosterman certain preventive protocols, quality metrics, and best practice recommendations. A written personalized care plan for preventive services as well as general preventive health recommendations is available and can be mailed to the patient at his request.      Baldomero Lamy, LPN  [phone]

## 2021-03-03 NOTE — ED Provider Notes (Signed)
[facility] EMERGENCY DEPARTMENT Provider Note   CSN: [id number] Arrival date & time: 03/01/21  1300     History Chief Complaint  Patient presents with   Skin Problem    Craig Baird is a 87 y.o. male.  HPI      Craig Baird is a 87 y.o. male who presents to the Emergency Department requesting evaluation of persistent itching.  He states symptoms have been present for 15 years.  Itching can be in various places and he states many times wake him from sleep.  States that he is currently seeing a dermatologist and given a prescription cream that hasn't provided relief.  He notes scratching his left lower leg until he now has an open wound.  Patient states that he was bringing his spouse to the ER for evaluation, so he decided to check in for evaluation as well.  He denies fever, chills, swelling, redness, rash, hx of liver disease, new medications or hygiene products.     Past Medical History:  Diagnosis Date   Allergy    Anxiety    Cataract    Diverticulosis    Hernia    L inguinal hernia   Hiatal hernia    Hyperlipidemia    SCCA (squamous cell carcinoma) of skin 09/22/2019   Right Forearem-posterior (well diff) (treatment after biopsy)   Sinusitis     Patient Active Problem List   Diagnosis Date Noted   Loss of weight 08/31/2016   Osteopenia 11/23/2015   GAD (generalized anxiety disorder) 10/23/2014   Insomnia 10/23/2014   Arthritis 10/23/2014   Hyperlipidemia 10/23/2014   Allergic rhinitis 08/12/2012    Past Surgical History:  Procedure Laterality Date   COLONOSCOPY N/A 09/15/2016   Dr. Gala Romney: Diverticulosis, several polyps removed, couple of tubular adenomas.  No future surveillance colonoscopies recommended given age.   ESOPHAGOGASTRODUODENOSCOPY N/A 09/15/2016   Dr. Gala Romney: small hiatal hernia.   THYROID CYST EXCISION  1973   TONSILLECTOMY         Family History  Problem Relation Age of Onset   Hypertension Mother    Stroke Mother     Parkinson's disease Mother    Hip fracture Mother    Hypertension Brother 29   Heart disease Brother    Birth defects Father        spine   Early death Sister    Early death Sister    Colon cancer Neg Hx    Gastric cancer Neg Hx    Esophageal cancer Neg Hx     Social History   Tobacco Use   Smoking status: Never   Smokeless tobacco: Never  Vaping Use   Vaping Use: Never used  Substance Use Topics   Alcohol use: No   Drug use: No    Home Medications Prior to Admission medications   Medication Sig Start Date End Date Taking? Authorizing Provider  diphenhydrAMINE (BENADRYL) 25 MG tablet Take 1/2 tablet every 6 hours as needed for itching.  May cause drowsiness. 03/01/21  Yes Shadrack Brummitt, PA-C  predniSONE (DELTASONE) 20 MG tablet Take 2 tablets (40 mg total) by mouth daily. 03/01/21  Yes Lossie Kalp, PA-C  atorvastatin (LIPITOR) 20 MG tablet Take 1 tablet (20 mg total) by mouth daily. 02/23/21   Dettinger, Fransisca Kaufmann, MD  escitalopram (LEXAPRO) 10 MG tablet Take 1 tablet (10 mg total) by mouth daily. 02/23/21   Dettinger, Fransisca Kaufmann, MD  fluticasone (FLONASE) 50 MCG/ACT nasal spray Place 1 spray into both nostrils  2 (two) times daily as needed for allergies or rhinitis. 07/29/20   Dettinger, Fransisca Kaufmann, MD    Allergies    Sulfa antibiotics  Review of Systems   Review of Systems  Constitutional:  Negative for chills, fatigue and fever.  HENT:  Negative for facial swelling, sore throat and trouble swallowing.   Eyes:  Negative for visual disturbance.  Respiratory:  Negative for cough, shortness of breath and wheezing.   Cardiovascular:  Negative for chest pain and palpitations.  Gastrointestinal:  Negative for abdominal pain, nausea and vomiting.  Genitourinary:  Negative for dysuria, flank pain and hematuria.  Musculoskeletal:  Negative for arthralgias, back pain, myalgias, neck pain and neck stiffness.  Skin:  Negative for color change and rash.       Generalized  itching.  Scabbed wound to left lower leg.  Allergic/Immunologic: Negative for food allergies and immunocompromised state.  Neurological:  Negative for dizziness, weakness and numbness.  Hematological:  Does not bruise/bleed easily.  Psychiatric/Behavioral:  Negative for confusion.   All other systems reviewed and are negative.  Physical Exam Updated Vital Signs BP 140/70    Pulse 70    Temp 98 F (36.7 C) (Oral)    Resp 16    SpO2 99%   Physical Exam Vitals and nursing note reviewed.  Constitutional:      Appearance: Normal appearance. He is not ill-appearing or toxic-appearing.  HENT:     Head: Normocephalic and atraumatic.  Eyes:     Conjunctiva/sclera: Conjunctivae normal.     Pupils: Pupils are equal, round, and reactive to light.  Neck:     Thyroid: No thyromegaly.     Trachea: Phonation normal.     Meningeal: Kernig's sign absent.  Cardiovascular:     Rate and Rhythm: Normal rate and regular rhythm.     Pulses: Normal pulses.  Pulmonary:     Effort: Pulmonary effort is normal.     Breath sounds: Normal breath sounds. No wheezing.  Abdominal:     Palpations: Abdomen is soft.     Tenderness: There is no abdominal tenderness. There is no guarding or rebound.  Musculoskeletal:        General: Normal range of motion.     Cervical back: Normal range of motion.     Right lower leg: No edema.     Left lower leg: No edema.  Lymphadenopathy:     Cervical: No cervical adenopathy.  Skin:    General: Skin is warm and dry.     Capillary Refill: Capillary refill takes less than 2 seconds.     Coloration: Skin is not jaundiced.     Findings: No bruising, erythema or rash.  Neurological:     General: No focal deficit present.     Mental Status: He is alert and oriented to person, place, and time.     Sensory: No sensory deficit.     Motor: No weakness.    ED Results / Procedures / Treatments   Labs (all labs ordered are listed, but only abnormal results are  displayed) Labs Reviewed - No data to display  EKG None  Radiology No results found.  Procedures Procedures   Medications Ordered in ED Medications - No data to display  ED Course  I have reviewed the triage vital signs and the nursing notes.  Pertinent labs & imaging results that were available during my care of the patient were reviewed by me and considered in my medical decision making (see chart  for details).    MDM Rules/Calculators/A&P                         Pt here for eval of generalized itching, sx's reported to be present "for 15 years." He is followed by dermatology and given steroid cream which pt denies any improvement. Stated that he brought spouse to ER for evaluation, so he decided  to be evaluated as well for his itching.    On exam pt has area of excoriation to left lower leg w/o findings of cellulitis, abscess, area appears to be scabbed.    Pt well appearing, doubt emergent process.  Pt reassured and will try short course of oral steroid and recommended OTC benadryl.  Pt appears appropriate for d/c home.         Final Clinical Impression(s) / ED Diagnoses Final diagnoses:  Rash and nonspecific skin eruption    Rx / DC Orders ED Discharge Orders          Ordered    predniSONE (DELTASONE) 20 MG tablet  Daily        03/01/21 1616    diphenhydrAMINE (BENADRYL) 25 MG tablet        03/01/21 [address] PA-C 03/04/21 0019    Varney Biles, MD 03/04/21 [phone]

## 2021-03-17 DIAGNOSIS — H2513 Age-related nuclear cataract, bilateral: Secondary | ICD-10-CM | POA: Diagnosis not present

## 2021-03-17 DIAGNOSIS — H40033 Anatomical narrow angle, bilateral: Secondary | ICD-10-CM | POA: Diagnosis not present

## 2021-04-26 ENCOUNTER — Other Ambulatory Visit: Payer: Self-pay

## 2021-04-26 ENCOUNTER — Encounter: Payer: Self-pay | Admitting: Dermatology

## 2021-04-26 ENCOUNTER — Ambulatory Visit: Payer: Medicare Other | Admitting: Dermatology

## 2021-04-26 DIAGNOSIS — L570 Actinic keratosis: Secondary | ICD-10-CM

## 2021-04-26 DIAGNOSIS — Z85828 Personal history of other malignant neoplasm of skin: Secondary | ICD-10-CM

## 2021-04-26 DIAGNOSIS — Z1283 Encounter for screening for malignant neoplasm of skin: Secondary | ICD-10-CM | POA: Diagnosis not present

## 2021-04-26 DIAGNOSIS — L309 Dermatitis, unspecified: Secondary | ICD-10-CM

## 2021-04-26 MED ORDER — DUPIXENT 300 MG/2ML ~~LOC~~ SOAJ: 300.0000 mg | SUBCUTANEOUS | 0 refills | Status: DC

## 2021-04-26 MED ORDER — TACROLIMUS 0.1 % EX OINT: TOPICAL_OINTMENT | Freq: Every day | CUTANEOUS | 0 refills | Status: DC

## 2021-04-27 ENCOUNTER — Encounter: Payer: Self-pay | Admitting: Dermatology

## 2021-04-27 NOTE — Progress Notes (Addendum)
° °  Follow-Up Visit   Subjective  Craig Baird is a 88 y.o. male who presents for the following: Annual Exam (Pt states his whole body is itching.  Personal history of scc. ).  Total Body itch and eczema Location:  Duration:  Quality:  Associated Signs/Symptoms: Modifying Factors:  Severity:  Timing: Context:   Objective  Well appearing patient in no apparent distress; mood and affect are within normal limits. Chest (Upper Torso, Anterior), Torso - Posterior (Back) Extensive papular eczema, more than 20% body surface area involved with essentially total body itching that has major negative impact on his life.  No sign of scabies.  I discussed seeking a second opinion with Dr. Juliet Rude at [facility] Department of dermatology and he said he would not pursue this.  We discussed narrowband ultraviolet light but this was would be rather inconvenient for him we again discussed Dupixent in great detail along with the possibility that if his insurance did not cover it we may be able to obtain free medicine and he was interested in pursuing that.  We will give him his initial 600 mg 2 shots today and have him return in 2 weeks.       Right Forearm - Anterior Horn like 4 mm crust    A full examination was performed including scalp, head, eyes, ears, nose, lips, neck, chest, axillae, abdomen, back, buttocks, bilateral upper extremities, bilateral lower extremities, hands, feet, fingers, toes, fingernails, and toenails. All findings within normal limits unless otherwise noted below.  Areas beneath underwear not fully examined   Assessment & Plan    Eczema, unspecified type Chest (Upper Torso, Anterior); Torso - Posterior (Back)  SubQ  Dupixent 600 mg.  Return in 2 weeks. SAMPLE GIVEN IN OFFICE LOT: [id number] EXP: 03-13-2023  tacrolimus (PROTOPIC) 0.1 % ointment - Chest (Upper Torso, Anterior), Torso - Posterior (Back) Apply topically daily.  Actinic  keratosis Right Forearm - Anterior  Destruction of lesion - Right Forearm - Anterior Complexity: simple   Destruction method: cryotherapy   Informed consent: discussed and consent obtained   Lesion destroyed using liquid nitrogen: Yes   Cryotherapy cycles:  3 Outcome: patient tolerated procedure well with no complications        I, Lavonna Monarch, MD, have reviewed all documentation for this visit.  The documentation on 05/08/21 for the exam, diagnosis, procedures, and orders are all accurate and complete.

## 2021-04-28 ENCOUNTER — Telehealth: Payer: Self-pay

## 2021-04-28 NOTE — Telephone Encounter (Signed)
Dupixent new start prescription sent through [facility].

## 2021-04-28 NOTE — Telephone Encounter (Signed)
[place] form completed and faxed to [place] [facility].

## 2021-04-28 NOTE — Telephone Encounter (Deleted)
Dupixent MyWay Enrollement Form fa

## 2021-05-05 DIAGNOSIS — M79676 Pain in unspecified toe(s): Secondary | ICD-10-CM | POA: Diagnosis not present

## 2021-05-05 DIAGNOSIS — B351 Tinea unguium: Secondary | ICD-10-CM | POA: Diagnosis not present

## 2021-05-05 NOTE — Telephone Encounter (Signed)
Per [facility] copay [phone] patient aware we can help with samples until he gets it covered. He has a follow up next week and will get him more injections or Dupixent to take home.

## 2021-05-10 ENCOUNTER — Other Ambulatory Visit: Payer: Self-pay

## 2021-05-10 ENCOUNTER — Ambulatory Visit: Payer: Medicare Other | Admitting: Dermatology

## 2021-05-10 DIAGNOSIS — L2084 Intrinsic (allergic) eczema: Secondary | ICD-10-CM

## 2021-05-20 ENCOUNTER — Encounter: Payer: Self-pay | Admitting: Dermatology

## 2021-05-20 NOTE — Progress Notes (Signed)
° °  Follow-Up Visit   Subjective  Craig Baird is a 88 y.o. male who presents for the following: Follow-up (Here for 2 week follow up on eczema. Gave dupixent samples last visit. ).  Total body eczema Location:  Duration:  Quality:  Associated Signs/Symptoms: Modifying Factors:  Severity:  Timing: Context:   Objective  Well appearing patient in no apparent distress; mood and affect are within normal limits. Mid Back Total body itching which interferes with sleep significant negative impact on the quality of his daily life.  Visible eczematous dermatitis over 40 to 50% body surface area.  I again reviewed Dupixent in some detail; he actually experienced significant reduction in itching from his initial shots.  He remains skeptical about being able to get this medicine for an affordable price.    All skin waist up examined.   Assessment & Plan    Intrinsic eczema Mid Back  Gave Dupixent samples last visit. patient has high copay. We will get him samples till we get him covered.       I, Lavonna Monarch, MD, have reviewed all documentation for this visit.  The documentation on 05/20/21 for the exam, diagnosis, procedures, and orders are all accurate and complete.

## 2021-05-23 ENCOUNTER — Encounter: Payer: Self-pay | Admitting: Nurse Practitioner

## 2021-05-23 ENCOUNTER — Ambulatory Visit (INDEPENDENT_AMBULATORY_CARE_PROVIDER_SITE_OTHER): Payer: Medicare Other | Admitting: Nurse Practitioner

## 2021-05-23 ENCOUNTER — Ambulatory Visit (INDEPENDENT_AMBULATORY_CARE_PROVIDER_SITE_OTHER): Payer: Medicare Other

## 2021-05-23 VITALS — BP 142/83 | HR 76 | Temp 98.1°F | Resp 20 | Ht 64.0 in | Wt 144.0 lb

## 2021-05-23 DIAGNOSIS — M5441 Lumbago with sciatica, right side: Secondary | ICD-10-CM

## 2021-05-23 DIAGNOSIS — M47816 Spondylosis without myelopathy or radiculopathy, lumbar region: Secondary | ICD-10-CM | POA: Diagnosis not present

## 2021-05-23 MED ORDER — PREDNISONE 20 MG PO TABS: ORAL_TABLET | ORAL | 0 refills | Status: DC

## 2021-05-23 MED ORDER — METHYLPREDNISOLONE ACETATE 80 MG/ML IJ SUSP
80.0000 mg | Freq: Once | INTRAMUSCULAR | Status: AC
  Administered 2021-05-23: 80 mg via INTRAMUSCULAR

## 2021-05-23 NOTE — Patient Instructions (Signed)
Degenerative Disk Disease °Degenerative disk disease is a condition caused by changes that occur in the spinal disks as a person ages. Spinal disks are soft and compressible disks located between the bones of your spine (vertebrae). These disks act like shock absorbers. °Degenerative disk disease can affect the whole spine. However, the neck and lower back are most often affected. Many changes can occur in the spinal disks with aging, such as: °The spinal disks may dry and shrink. °Small tears may occur in the tough, outer covering of the disk (annulus). °The disk space may become smaller due to loss of water. °Abnormal growths in the bone (spurs) may occur. This can put pressure on the nerve roots exiting the spinal canal, causing pain. °The spinal canal may become narrowed. °What are the causes? °This condition may be caused by: °Normal degeneration with age. °Injuries. °Certain activities and sports that cause damage. °What increases the risk? °The following factors may make you more likely to develop this condition: °Being overweight. °Having a family history of degenerative disk disease. °Smoking and use of products that contain nicotine and tobacco. °Sudden injury. °Doing work that requires heavy lifting. °What are the signs or symptoms? °Symptoms of this condition include: °Pain that varies in intensity. Some people have no pain, while others have severe pain. The location of the pain depends on the part of your backbone that is affected. You may have: °Pain in your neck or arm if a disk in your neck area is affected. °Pain in your back, buttocks, or legs if a disk in your lower back is affected. °Pain that becomes worse while bending or reaching up, or with twisting movements. °Pain that may start gradually and worsen as time passes. It may also start after a major or minor injury. °Numbness or tingling in the arms or legs. °How is this diagnosed? °This condition may be diagnosed based on: °Your symptoms and  medical history. °A physical exam. °Imaging tests, including: °X-ray of the spine. °CT scan. °MRI. °How is this treated? °This condition may be treated with: °Medicines. °Injection of steroids into the back. °Rehabilitation exercises. These activities aim to strengthen muscles in your back and abdomen to better support your spine. °If treatments do not help to relieve your symptoms or you have severe pain, you may need surgery. °Follow these instructions at home: °Medicines °Take over-the-counter and prescription medicines only as told by your health care provider. °Ask your health care provider if the medicine prescribed to you: °Requires you to avoid driving or using machinery. °Can cause constipation. You may need to take these actions to prevent or treat constipation: °Drink enough fluid to keep your urine pale yellow. °Take over-the-counter or prescription medicines. °Eat foods that are high in fiber, such as beans, whole grains, and fresh fruits and vegetables. °Limit foods that are high in fat and processed sugars, such as fried or sweet foods. °Activity °Rest as told by your health care provider. °Avoid sitting for a long time without moving. Get up to take short walks every 1-2 hours. This is important to improve blood flow and breathing. Ask for help if you feel weak or unsteady. °Return to your normal activities as told by your health care provider. Ask your health care provider what activities are safe for you. °Perform relaxation exercises as told by your health care provider. °Maintain good posture. °Do not lift anything that is heavier than 10 lb (4.5 kg), or the limit that you are told, until your health care   provider says that it is safe. °Follow proper lifting and walking techniques as told by your health care provider. °Managing pain, stiffness, and swelling °  °If directed, put ice on the painful area. Icing can help to relieve pain. To do this: °Put ice in a plastic bag. °Place a towel between  your skin and the bag. °Leave the ice on for 20 minutes, 2-3 times a day. °Remove the ice if your skin turns bright red. This is very important. If you cannot feel pain, heat, or cold, you have a greater risk of damage to the area. °If directed, apply heat to the painful area as often as told by your health care provider. Heat can reduce the stiffness of your muscles. Use the heat source that your health care provider recommends, such as a moist heat pack or a heating pad. °Place a towel between your skin and the heat source. °Leave the heat on for 20-30 minutes. °Remove the heat if your skin turns bright red. This is especially important if you are unable to feel pain, heat, or cold. You may have a greater risk of getting burned. °General instructions °Change your sitting, standing, and sleeping habits as told by your health care provider. °Avoid sitting in the same position for long periods of time. Change positions frequently. °Lose weight or maintain a healthy weight as told by your health care provider. °Do not use any products that contain nicotine or tobacco, such as cigarettes, e-cigarettes, and chewing tobacco. If you need help quitting, ask your health care provider. °Wear supportive footwear. °Keep all follow-up visits. This is important. This may include visits for physical therapy. °Contact a health care provider if you: °Have pain that does not go away within 1-4 weeks. °Lose your appetite. °Lose weight without trying. °Get help right away if you: °Have severe pain. °Notice weakness in your arms, hands, or legs. °Begin to lose control of your bladder or bowel movements. °Have fevers or night sweats. °Summary °Degenerative disk disease is a condition caused by changes that occur in the spinal disks as a person ages. °This condition can affect the whole spine. However, the neck and lower back are most often affected. °Take over-the-counter and prescription medicines only as told by your health care  provider. °This information is not intended to replace advice given to you by your health care provider. Make sure you discuss any questions you have with your health care provider. °Document Revised: 06/12/2019 Document Reviewed: 06/12/2019 °Elsevier Patient Education © 2022 [employer] ° °

## 2021-05-23 NOTE — Progress Notes (Signed)
? ?  Subjective:  ? ? Patient ID: Craig Baird, male    DOB: [birth year], 88 y.o.   MRN: [id number] ? ?Chief Complaint: Back Pain ? ? ?Back Pain ?This is a new problem. The current episode started more than 1 month ago. The problem occurs intermittently. The problem has been waxing and waning since onset. The pain is present in the lumbar spine. The quality of the pain is described as aching. The pain does not radiate. The pain is at a severity of 6/10. The pain is moderate. The symptoms are aggravated by lying down. Pertinent negatives include no dysuria, leg pain, numbness, paresthesias, perianal numbness or weakness. Risk factors: has had a history of back pain for years- use to see chiropractor. He has tried nothing for the symptoms.  ? ? ? ? ?Review of Systems  ?Constitutional: Negative.   ?HENT: Negative.    ?Respiratory: Negative.    ?Genitourinary: Negative.  Negative for dysuria and frequency.  ?Musculoskeletal:  Positive for back pain.  ?Neurological: Negative.  Negative for weakness, numbness and paresthesias.  ?Psychiatric/Behavioral: Negative.    ? ?   ?Objective:  ? Physical Exam ?Constitutional:   ?   Appearance: Normal appearance.  ?Cardiovascular:  ?   Rate and Rhythm: Normal rate and regular rhythm.  ?   Heart sounds: Normal heart sounds.  ?Pulmonary:  ?   Breath sounds: Normal breath sounds.  ?Musculoskeletal:  ?   Comments: FROM of lumbar spine with pain on walking- cant straighten up all th eway ?(-) SLR bil ?Motor strength and sensation are distally intact.  ?Skin: ?   General: Skin is warm.  ?Neurological:  ?   General: No focal deficit present.  ?   Mental Status: He is alert and oriented to person, place, and time.  ?Psychiatric:     ?   Mood and Affect: Mood normal.     ?   Behavior: Behavior normal.  ? ?BP (!) 142/83   Pulse 76   Temp 98.1 ?F (36.7 ?C) (Temporal)   Resp 20   Ht '5\' 4"'$  (1.626 m)   Wt 144 lb (65.3 kg)   SpO2 97%   BMI 24.72 kg/m?  ? ?Xray- DDD at L1-2 ? ? ?    ?Assessment & Plan:  ? ?Craig Baird in today with chief complaint of Back Pain ? ? ?1. Acute right-sided low back pain with right-sided sciatica ?Xray discussed will call with radiology report ?Moist heat ?rest ?- DG Lumbar Spine 2-3 Views ?- methylPREDNISolone acetate (DEPO-MEDROL) injection 80 mg ? ? ? ?The above assessment and management plan was discussed with the patient. The patient verbalized understanding of and has agreed to the management plan. Patient is aware to call the clinic if symptoms persist or worsen. Patient is aware when to return to the clinic for a follow-up visit. Patient educated on when it is appropriate to go to the emergency department.  ? ?Mary-Margaret Hassell Done, FNP ? ? ?

## 2021-05-25 ENCOUNTER — Telehealth: Payer: Self-pay | Admitting: *Deleted

## 2021-05-25 NOTE — Telephone Encounter (Signed)
Patient calling back about dupixent samples- wanted [place] to mail them to him . Informed him that we aren't able to mail the sample. Also explained he would need a nurse to see for injection training for dupixent. Patient then stated don't worry about. Im not going to take the medicine, just keep samples and give to someone else.  ?

## 2021-06-20 ENCOUNTER — Telehealth: Payer: Self-pay

## 2021-06-20 NOTE — Telephone Encounter (Signed)
Fax received from [facility] stating that the patient's Dupixent is approved from 03/13/2021 through 03/12/2022. Approval number [id number]. ?

## 2021-09-01 ENCOUNTER — Ambulatory Visit (INDEPENDENT_AMBULATORY_CARE_PROVIDER_SITE_OTHER): Payer: Medicare Other | Admitting: Family Medicine

## 2021-09-01 ENCOUNTER — Encounter: Payer: Self-pay | Admitting: Family Medicine

## 2021-09-01 VITALS — BP 131/63 | HR 69 | Temp 97.6°F | Ht 64.0 in | Wt 140.0 lb

## 2021-09-01 DIAGNOSIS — G4700 Insomnia, unspecified: Secondary | ICD-10-CM

## 2021-09-01 DIAGNOSIS — J309 Allergic rhinitis, unspecified: Secondary | ICD-10-CM

## 2021-09-01 MED ORDER — LORATADINE 10 MG PO TABS: 10.0000 mg | ORAL_TABLET | Freq: Every day | ORAL | 1 refills | Status: DC | PRN

## 2021-09-01 MED ORDER — TRAZODONE HCL 50 MG PO TABS: 25.0000 mg | ORAL_TABLET | Freq: Every evening | ORAL | 3 refills | Status: DC | PRN

## 2021-09-01 NOTE — Progress Notes (Signed)
BP 131/63   Pulse 69   Temp 97.6 F (36.4 C)   Ht '5\' 4"'$  (1.626 m)   Wt 140 lb (63.5 kg)   SpO2 95%   BMI 24.03 kg/m    Subjective:   Patient ID: Craig Baird, male    DOB: February 05, 1934, 88 y.o.   MRN: [id number]  HPI: Craig Baird is a 88 y.o. male presenting on 09/01/2021 for Insomnia (Tosses and turns. Never sleeps, just rest. H/O this for years. OTC meds do not help)   HPI Insomnia Patient is coming in today to discuss insomnia and issues with falling asleep.  He says once he is asleep he is fine but is more about the issues with falling asleep.  He says is been going on for quite some time, many years.  He says he is just getting the point where he wants to try something for it.  He has tried an over-the-counter sleep aid for it.  He said over-the-counter 1 did not help at all.  He also gets allergic rhinitis and congestion and has been using Flonase for it and he is wondering if there is something else that can help with it.  He says he gets a lot of sinus congestion specifically in the fall in the spring and wants to know if there is something else that he can take for it.  Denies any fevers or chills.  Is been going on for at least a few weeks or maybe a month that he has been fighting this and is just not improving.  Relevant past medical, surgical, family and social history reviewed and updated as indicated. Interim medical history since our last visit reviewed. Allergies and medications reviewed and updated.  Review of Systems  Constitutional:  Negative for chills and fever.  HENT:  Positive for congestion, postnasal drip, rhinorrhea, sinus pressure and sneezing. Negative for ear discharge, ear pain, sore throat and voice change.   Eyes:  Negative for pain, discharge, redness and visual disturbance.  Respiratory:  Negative for shortness of breath and wheezing.   Cardiovascular:  Negative for chest pain and leg swelling.  Musculoskeletal:  Negative for gait problem.   Skin:  Negative for rash.  Psychiatric/Behavioral:  Positive for sleep disturbance. Negative for self-injury.   All other systems reviewed and are negative.   Per HPI unless specifically indicated above   Allergies as of 09/01/2021       Reactions   Sulfa Antibiotics Rash        Medication List        Accurate as of September 01, 2021 12:18 PM. If you have any questions, ask your nurse or doctor.          STOP taking these medications    atorvastatin 20 MG tablet Commonly known as: LIPITOR Stopped by: Fransisca Kaufmann Ephram Kornegay, MD   diphenhydrAMINE 25 MG tablet Commonly known as: BENADRYL Stopped by: Worthy Rancher, MD   predniSONE 20 MG tablet Commonly known as: DELTASONE Stopped by: Fransisca Kaufmann Siraj Dermody, MD   tacrolimus 0.1 % ointment Commonly known as: PROTOPIC Stopped by: Fransisca Kaufmann Chanda Laperle, MD       TAKE these medications    escitalopram 10 MG tablet Commonly known as: Lexapro Take 1 tablet (10 mg total) by mouth daily.   fluticasone 50 MCG/ACT nasal spray Commonly known as: FLONASE Place 1 spray into both nostrils 2 (two) times daily as needed for allergies or rhinitis.   loratadine 10 MG tablet  Commonly known as: CLARITIN Take 1 tablet (10 mg total) by mouth daily as needed for allergies, rhinitis or itching. Started by: Worthy Rancher, MD   traZODone 50 MG tablet Commonly known as: DESYREL Take 0.5-1 tablets (25-50 mg total) by mouth at bedtime as needed for sleep. Started by: Fransisca Kaufmann Rmoni Keplinger, MD         Objective:   BP 131/63   Pulse 69   Temp 97.6 F (36.4 C)   Ht '5\' 4"'$  (1.626 m)   Wt 140 lb (63.5 kg)   SpO2 95%   BMI 24.03 kg/m   Wt Readings from Last 3 Encounters:  09/01/21 140 lb (63.5 kg)  05/23/21 144 lb (65.3 kg)  02/23/21 140 lb (63.5 kg)    Physical Exam Vitals and nursing note reviewed.  Constitutional:      General: He is not in acute distress.    Appearance: He is well-developed. He is not diaphoretic.   HENT:     Right Ear: External ear normal.     Left Ear: External ear normal.     Nose:     Right Sinus: No maxillary sinus tenderness or frontal sinus tenderness.     Left Sinus: No maxillary sinus tenderness or frontal sinus tenderness.     Mouth/Throat:     Pharynx: Uvula midline. No oropharyngeal exudate or posterior oropharyngeal erythema.     Tonsils: No tonsillar abscesses.  Eyes:     General: No scleral icterus.    Conjunctiva/sclera: Conjunctivae normal.  Neck:     Thyroid: No thyromegaly.  Cardiovascular:     Rate and Rhythm: Normal rate and regular rhythm.     Heart sounds: Normal heart sounds. No murmur heard. Pulmonary:     Effort: Pulmonary effort is normal. No respiratory distress.     Breath sounds: Normal breath sounds. No wheezing or rales.  Musculoskeletal:        General: Normal range of motion.     Cervical back: Neck supple.  Lymphadenopathy:     Cervical: No cervical adenopathy.  Skin:    General: Skin is warm and dry.     Findings: No rash.  Neurological:     Mental Status: He is alert and oriented to person, place, and time.     Coordination: Coordination normal.  Psychiatric:        Behavior: Behavior normal.       Assessment & Plan:   Problem List Items Addressed This Visit       Respiratory   Allergic rhinitis   Relevant Medications   loratadine (CLARITIN) 10 MG tablet     Other   Insomnia - Primary   Relevant Medications   traZODone (DESYREL) 50 MG tablet    We will add trazodone for sleep to see if it helps and we will do Claritin for his allergic rhinitis that he can use as needed and see if that helps as well. Follow up plan: Return if symptoms worsen or fail to improve.  Counseling provided for all of the vaccine components No orders of the defined types were placed in this encounter.   Caryl Pina, MD [facility] 09/01/2021, 12:18 PM

## 2021-11-08 ENCOUNTER — Ambulatory Visit (INDEPENDENT_AMBULATORY_CARE_PROVIDER_SITE_OTHER): Payer: Medicare Other | Admitting: Family Medicine

## 2021-11-08 ENCOUNTER — Encounter: Payer: Self-pay | Admitting: Family Medicine

## 2021-11-08 VITALS — BP 136/68 | HR 65 | Temp 98.9°F | Ht 64.0 in | Wt 140.0 lb

## 2021-11-08 DIAGNOSIS — M5442 Lumbago with sciatica, left side: Secondary | ICD-10-CM

## 2021-11-08 DIAGNOSIS — G8929 Other chronic pain: Secondary | ICD-10-CM | POA: Diagnosis not present

## 2021-11-08 DIAGNOSIS — M5441 Lumbago with sciatica, right side: Secondary | ICD-10-CM | POA: Diagnosis not present

## 2021-11-08 DIAGNOSIS — M545 Low back pain, unspecified: Secondary | ICD-10-CM | POA: Insufficient documentation

## 2021-11-08 MED ORDER — DICLOFENAC SODIUM 1 % EX GEL: 2.0000 g | Freq: Four times a day (QID) | CUTANEOUS | 0 refills | Status: DC

## 2021-11-08 MED ORDER — METHYLPREDNISOLONE ACETATE 80 MG/ML IJ SUSP
60.0000 mg | Freq: Once | INTRAMUSCULAR | Status: AC
  Administered 2021-11-08: 60 mg via INTRAMUSCULAR

## 2021-11-08 NOTE — Progress Notes (Signed)
Subjective:  Patient ID: Craig Baird, male    DOB: 05/07/1933, 88 y.o.   MRN: [id number]  Patient Care Team: Dettinger, Fransisca Kaufmann, MD as PCP - General (Family Medicine) Lavonna Monarch, MD as Consulting Physician (Dermatology) Gala Romney Cristopher Estimable, MD as Consulting Physician (Gastroenterology)   Chief Complaint:  Back Pain   HPI: Craig Baird is a 88 y.o. male presenting on 11/08/2021 for Back Pain   Back Pain This is a chronic problem. The current episode started more than 1 year ago. The problem occurs intermittently. The problem has been waxing and waning since onset. The pain is present in the lumbar spine and sacro-iliac. The quality of the pain is described as burning, aching and shooting. The pain radiates to the left thigh and right thigh. The pain is moderate. The symptoms are aggravated by bending, twisting and position. Associated symptoms include leg pain. Pertinent negatives include no abdominal pain, bladder incontinence, bowel incontinence, chest pain, dysuria, fever, headaches, numbness, paresis, paresthesias, pelvic pain, perianal numbness, tingling, weakness or weight loss. He has tried chiropractic manipulation for the symptoms. The treatment provided no relief.   Relevant past medical, surgical, family, and social history reviewed and updated as indicated.  Allergies and medications reviewed and updated. Data reviewed: Chart in Epic.   Past Medical History:  Diagnosis Date  . Allergy   . Anxiety   . Cataract   . Diverticulosis   . Hernia    L inguinal hernia  . Hiatal hernia   . Hyperlipidemia   . SCCA (squamous cell carcinoma) of skin 09/22/2019   Right Forearem-posterior (well diff) (treatment after biopsy)  . Sinusitis     Past Surgical History:  Procedure Laterality Date  . COLONOSCOPY N/A 09/15/2016   Dr. Gala Romney: Diverticulosis, several polyps removed, couple of tubular adenomas.  No future surveillance colonoscopies recommended given age.  .  ESOPHAGOGASTRODUODENOSCOPY N/A 09/15/2016   Dr. Gala Romney: small hiatal hernia.  . THYROID CYST EXCISION  1973  . TONSILLECTOMY      Social History   Socioeconomic History  . Marital status: Married    Spouse name: Thelma   . Number of children: 2  . Years of education: Not on file  . Highest education level: 7th grade  Occupational History  . Not on file  Tobacco Use  . Smoking status: Never  . Smokeless tobacco: Never  Vaping Use  . Vaping Use: Never used  Substance and Sexual Activity  . Alcohol use: No  . Drug use: No  . Sexual activity: Yes  Other Topics Concern  . Not on file  Social History Narrative   Retired, lives with wife Phineas Semen, two grown children. Enjoys working around the house.    Social Determinants of Health   Financial Resource Strain: Low Risk  (11/29/2017)   Overall Financial Resource Strain (CARDIA)   . Difficulty of Paying Living Expenses: Not hard at all  Food Insecurity: No Food Insecurity (11/29/2017)   Hunger Vital Sign   . Worried About Charity fundraiser in the Last Year: Never true   . Ran Out of Food in the Last Year: Never true  Transportation Needs: No Transportation Needs (11/29/2017)   PRAPARE - Transportation   . Lack of Transportation (Medical): No   . Lack of Transportation (Non-Medical): No  Physical Activity: Insufficiently Active (11/29/2017)   Exercise Vital Sign   . Days of Exercise per Week: 2 days   . Minutes of Exercise per Session: 30  min  Stress: No Stress Concern Present (11/29/2017)   [place]   . Feeling of Stress : Not at all  Social Connections: Moderately Integrated (11/29/2017)   Social Connection and Isolation Panel [NHANES]   . Frequency of Communication with Friends and Family: More than three times a week   . Frequency of Social Gatherings with Friends and Family: More than three times a week   . Attends Religious Services: More than 4 times per  year   . Active Member of Clubs or Organizations: No   . Attends Archivist Meetings: Never   . Marital Status: Married  Human resources officer Violence: Not At Risk (11/29/2017)   Humiliation, Afraid, Rape, and Kick questionnaire   . Fear of Current or Ex-Partner: No   . Emotionally Abused: No   . Physically Abused: No   . Sexually Abused: No    Outpatient Encounter Medications as of 11/08/2021  Medication Sig  . diclofenac Sodium (VOLTAREN) 1 % GEL Apply 2 g topically 4 (four) times daily.  Marland Kitchen escitalopram (LEXAPRO) 10 MG tablet Take 1 tablet (10 mg total) by mouth daily.  . fluticasone (FLONASE) 50 MCG/ACT nasal spray Place 1 spray into both nostrils 2 (two) times daily as needed for allergies or rhinitis.  Marland Kitchen loratadine (CLARITIN) 10 MG tablet Take 1 tablet (10 mg total) by mouth daily as needed for allergies, rhinitis or itching.  . [DISCONTINUED] traZODone (DESYREL) 50 MG tablet Take 0.5-1 tablets (25-50 mg total) by mouth at bedtime as needed for sleep.   No facility-administered encounter medications on file as of 11/08/2021.    Allergies  Allergen Reactions  . Sulfa Antibiotics Rash    Review of Systems  Constitutional:  Positive for activity change. Negative for appetite change, chills, diaphoresis, fatigue, fever, unexpected weight change and weight loss.  Respiratory:  Negative for cough and shortness of breath.   Cardiovascular:  Negative for chest pain, palpitations and leg swelling.  Gastrointestinal:  Negative for abdominal pain, bowel incontinence, constipation, diarrhea, nausea and vomiting.  Genitourinary:  Negative for bladder incontinence, decreased urine volume, difficulty urinating, dysuria and pelvic pain.  Musculoskeletal:  Positive for arthralgias and back pain. Negative for gait problem, joint swelling, myalgias, neck pain and neck stiffness.  Neurological:  Negative for dizziness, tingling, tremors, seizures, syncope, facial asymmetry, speech difficulty,  weakness, light-headedness, numbness, headaches and paresthesias.  Psychiatric/Behavioral:  Negative for confusion.   All other systems reviewed and are negative.       Objective:  BP 136/68   Pulse 65   Temp 98.9 F (37.2 C)   Wt 140 lb (63.5 kg)   SpO2 97%   BMI 24.03 kg/m    Wt Readings from Last 3 Encounters:  11/08/21 140 lb (63.5 kg)  09/01/21 140 lb (63.5 kg)  05/23/21 144 lb (65.3 kg)    Physical Exam Vitals and nursing note reviewed.  Constitutional:      General: He is not in acute distress.    Appearance: Normal appearance. He is not ill-appearing, toxic-appearing or diaphoretic.  HENT:     Head: Normocephalic and atraumatic.     Mouth/Throat:     Mouth: Mucous membranes are moist.  Eyes:     Pupils: Pupils are equal, round, and reactive to light.  Cardiovascular:     Rate and Rhythm: Normal rate and regular rhythm.     Heart sounds: Normal heart sounds.  Pulmonary:     Effort: Pulmonary  effort is normal.     Breath sounds: Normal breath sounds.  Abdominal:     General: Bowel sounds are normal.     Palpations: Abdomen is soft.  Musculoskeletal:     Thoracic back: Normal.     Lumbar back: Tenderness present. No swelling, edema, deformity, signs of trauma, lacerations, spasms or bony tenderness. Decreased range of motion. Positive right straight leg raise test and positive left straight leg raise test. No scoliosis.     Right hip: Normal.     Left hip: Normal.     Right lower leg: No edema.     Left lower leg: No edema.  Skin:    General: Skin is warm and dry.     Capillary Refill: Capillary refill takes less than 2 seconds.  Neurological:     General: No focal deficit present.     Mental Status: He is alert and oriented to person, place, and time.  Psychiatric:        Mood and Affect: Mood normal.        Behavior: Behavior normal.        Thought Content: Thought content normal.        Judgment: Judgment normal.    Results for orders placed or  performed in visit on 02/23/21  CBC with Differential/Platelet  Result Value Ref Range   WBC 5.9 3.4 - 10.8 x10E3/uL   RBC 4.59 4.14 - 5.80 x10E6/uL   Hemoglobin 14.9 13.0 - 17.7 g/dL   Hematocrit 42.7 37.5 - 51.0 %   MCV 93 79 - 97 fL   MCH 32.5 26.6 - 33.0 pg   MCHC 34.9 31.5 - 35.7 g/dL   RDW 11.9 11.6 - 15.4 %   Platelets 261 150 - 450 x10E3/uL   Neutrophils 57 Not Estab. %   Lymphs 29 Not Estab. %   Monocytes 9 Not Estab. %   Eos 4 Not Estab. %   Basos 1 Not Estab. %   Neutrophils Absolute 3.3 1.4 - 7.0 x10E3/uL   Lymphocytes Absolute 1.7 0.7 - 3.1 x10E3/uL   Monocytes Absolute 0.5 0.1 - 0.9 x10E3/uL   EOS (ABSOLUTE) 0.3 0.0 - 0.4 x10E3/uL   Basophils Absolute 0.1 0.0 - 0.2 x10E3/uL   Immature Granulocytes 0 Not Estab. %   Immature Grans (Abs) 0.0 0.0 - 0.1 x10E3/uL  CMP14+EGFR  Result Value Ref Range   Glucose 94 70 - 99 mg/dL   BUN 10 8 - 27 mg/dL   Creatinine, Ser 0.91 0.76 - 1.27 mg/dL   eGFR 83 >59 mL/min/1.73   BUN/Creatinine Ratio 11 10 - 24   Sodium 145 (H) 134 - 144 mmol/L   Potassium 4.9 3.5 - 5.2 mmol/L   Chloride 104 96 - 106 mmol/L   CO2 26 20 - 29 mmol/L   Calcium 9.5 8.6 - 10.2 mg/dL   Total Protein 7.1 6.0 - 8.5 g/dL   Albumin 4.3 3.6 - 4.6 g/dL   Globulin, Total 2.8 1.5 - 4.5 g/dL   Albumin/Globulin Ratio 1.5 1.2 - 2.2   Bilirubin Total 0.4 0.0 - 1.2 mg/dL   Alkaline Phosphatase 119 44 - 121 IU/L   AST 21 0 - 40 IU/L   ALT 19 0 - 44 IU/L  Lipid panel  Result Value Ref Range   Cholesterol, Total 163 100 - 199 mg/dL   Triglycerides 76 0 - 149 mg/dL   HDL 61 >39 mg/dL   VLDL Cholesterol Cal 14 5 - 40 mg/dL   LDL  Chol Calc (NIH) 88 0 - 99 mg/dL   Chol/HDL Ratio 2.7 0.0 - 5.0 ratio  PSA, total and free  Result Value Ref Range   Prostate Specific Ag, Serum 2.1 0.0 - 4.0 ng/mL   PSA, Free 0.42 N/A ng/mL   PSA, Free Pct 20.0 %       Pertinent labs & imaging results that were available during my care of the patient were reviewed by me and  considered in my medical decision making.  Assessment & Plan:  [place] was seen today for back pain.  Diagnoses and all orders for this visit:  Chronic midline low back pain with bilateral sciatica Chronic back pain with bilateral sciatica. No new injuries. No red flags present concerning for cauda equina syndrome. Will burst with steroids today and place on Voltaren gel as prescribed. Pt aware to report new, worsening, or persistent symptoms. If no improving, follow up.  -     diclofenac Sodium (VOLTAREN) 1 % GEL; Apply 2 g topically 4 (four) times daily.     Continue all other maintenance medications.  Follow up plan: Return if symptoms worsen or fail to improve.   Continue healthy lifestyle choices, including diet (rich in fruits, vegetables, and lean proteins, and low in salt and simple carbohydrates) and exercise (at least 30 minutes of moderate physical activity daily).  Educational handout given for chronic back pain  The above assessment and management plan was discussed with the patient. The patient verbalized understanding of and has agreed to the management plan. Patient is aware to call the clinic if they develop any new symptoms or if symptoms persist or worsen. Patient is aware when to return to the clinic for a follow-up visit. Patient educated on when it is appropriate to go to the emergency department.   Monia Pouch, FNP-C [facility] [phone]

## 2022-02-24 ENCOUNTER — Encounter: Payer: Self-pay | Admitting: Family Medicine

## 2022-02-24 ENCOUNTER — Ambulatory Visit (INDEPENDENT_AMBULATORY_CARE_PROVIDER_SITE_OTHER): Payer: Medicare Other | Admitting: Family Medicine

## 2022-02-24 VITALS — BP 148/74 | HR 89 | Ht 64.0 in | Wt 139.0 lb

## 2022-02-24 DIAGNOSIS — E785 Hyperlipidemia, unspecified: Secondary | ICD-10-CM

## 2022-02-24 DIAGNOSIS — G8929 Other chronic pain: Secondary | ICD-10-CM

## 2022-02-24 DIAGNOSIS — J309 Allergic rhinitis, unspecified: Secondary | ICD-10-CM

## 2022-02-24 DIAGNOSIS — M5441 Lumbago with sciatica, right side: Secondary | ICD-10-CM | POA: Diagnosis not present

## 2022-02-24 DIAGNOSIS — Z0001 Encounter for general adult medical examination with abnormal findings: Secondary | ICD-10-CM | POA: Diagnosis not present

## 2022-02-24 DIAGNOSIS — F411 Generalized anxiety disorder: Secondary | ICD-10-CM | POA: Diagnosis not present

## 2022-02-24 DIAGNOSIS — Z0000 Encounter for general adult medical examination without abnormal findings: Secondary | ICD-10-CM

## 2022-02-24 DIAGNOSIS — G4700 Insomnia, unspecified: Secondary | ICD-10-CM | POA: Diagnosis not present

## 2022-02-24 DIAGNOSIS — M5442 Lumbago with sciatica, left side: Secondary | ICD-10-CM

## 2022-02-24 DIAGNOSIS — Z125 Encounter for screening for malignant neoplasm of prostate: Secondary | ICD-10-CM

## 2022-02-24 MED ORDER — DICLOFENAC SODIUM 1 % EX GEL: 2.0000 g | Freq: Four times a day (QID) | CUTANEOUS | 3 refills | Status: DC

## 2022-02-24 MED ORDER — LORATADINE 10 MG PO TABS: 10.0000 mg | ORAL_TABLET | Freq: Every day | ORAL | 3 refills | Status: DC | PRN

## 2022-02-24 MED ORDER — ESCITALOPRAM OXALATE 10 MG PO TABS: 10.0000 mg | ORAL_TABLET | Freq: Every day | ORAL | 3 refills | Status: DC

## 2022-02-24 NOTE — Progress Notes (Signed)
BP (!) 148/74   Pulse 89   Ht 5' 4" (1.626 m)   Wt 139 lb (63 kg)   SpO2 98%   BMI 23.86 kg/m    Subjective:   Patient ID: Craig Baird, male    DOB: September 01, 1933, 88 y.o.   MRN: [id number]  HPI: Craig Baird is a 88 y.o. male presenting on 02/24/2022 for Medical Management of Chronic Issues, Hyperlipidemia, Insomnia, and Osteoarthritis   HPI Physical exam Patient denies any chest pain, shortness of breath, headaches or vision issues, abdominal complaints, diarrhea, nausea, vomiting.  His only complaint is that he does have arthritis in his joints but he uses Voltaren gel for that.  Hyperlipidemia Patient is coming in for recheck of his hyperlipidemia. The patient is currently taking no medication currently, has been trying diet. They deny any issues with myalgias or history of liver damage from it. They deny any focal numbness or weakness or chest pain.   Anxiety and insomnia recheck Patient currently takes Lexapro.  He says he only takes it as needed and he does okay with that but he does admit that he stays anxious but it is not too much.    02/24/2022   10:05 AM 02/24/2022   10:04 AM 11/08/2021   12:22 PM 09/01/2021   12:00 PM 03/01/2021   10:43 AM  Depression screen PHQ 2/9  Decreased Interest  0 0 1 0  Down, Depressed, Hopeless  0 1 1 0  PHQ - 2 Score  0 1 2 0  Altered sleeping 2  0 2   Tired, decreased energy _0 Change in appetite 0  0 0   Feeling bad or failure about yourself  0  0 0   Trouble concentrating 0  0 0   Moving slowly or fidgety/restless 0  0 0   Suicidal thoughts 0  0 0   PHQ-9 Score   2 6   Difficult doing work/chores Not difficult at all  Not difficult at all Not difficult at all      Relevant past medical, surgical, family and social history reviewed and updated as indicated. Interim medical history since our last visit reviewed. Allergies and medications reviewed and updated.  Review of Systems  Constitutional:  Negative for  chills and fever.  HENT:  Negative for ear pain and tinnitus.   Eyes:  Negative for pain and visual disturbance.  Respiratory:  Negative for cough, shortness of breath and wheezing.   Cardiovascular:  Negative for chest pain, palpitations and leg swelling.  Gastrointestinal:  Negative for abdominal pain, blood in stool, constipation and diarrhea.  Genitourinary:  Negative for dysuria and hematuria.  Musculoskeletal:  Positive for arthralgias. Negative for back pain, gait problem and myalgias.  Skin:  Negative for rash.  Neurological:  Negative for dizziness, weakness, light-headedness and headaches.  Psychiatric/Behavioral:  Negative for suicidal ideas.   All other systems reviewed and are negative.   Per HPI unless specifically indicated above   Allergies as of 02/24/2022       Reactions   Sulfa Antibiotics Rash        Medication List        Accurate as of February 24, 2022 10:16 AM. If you have any questions, ask your nurse or doctor.          diclofenac Sodium 1 % Gel Commonly known as: Voltaren Apply 2 g topically 4 (four) times daily.   escitalopram 10 MG tablet  Commonly known as: Lexapro Take 1 tablet (10 mg total) by mouth daily.   fluticasone 50 MCG/ACT nasal spray Commonly known as: FLONASE Place 1 spray into both nostrils 2 (two) times daily as needed for allergies or rhinitis.   loratadine 10 MG tablet Commonly known as: CLARITIN Take 1 tablet (10 mg total) by mouth daily as needed for allergies, rhinitis or itching.         Objective:   BP (!) 148/74   Pulse 89   Ht 5' 4" (1.626 m)   Wt 139 lb (63 kg)   SpO2 98%   BMI 23.86 kg/m   Wt Readings from Last 3 Encounters:  02/24/22 139 lb (63 kg)  11/08/21 140 lb (63.5 kg)  09/01/21 140 lb (63.5 kg)    Physical Exam Vitals and nursing note reviewed.  Constitutional:      General: He is not in acute distress.    Appearance: He is well-developed. He is not diaphoretic.  HENT:     Right  Ear: External ear normal.     Left Ear: External ear normal.     Nose: Nose normal.     Mouth/Throat:     Pharynx: No oropharyngeal exudate.  Eyes:     General: No scleral icterus.    Conjunctiva/sclera: Conjunctivae normal.  Neck:     Thyroid: No thyromegaly.  Cardiovascular:     Rate and Rhythm: Normal rate and regular rhythm.     Heart sounds: Normal heart sounds. No murmur heard. Pulmonary:     Effort: Pulmonary effort is normal. No respiratory distress.     Breath sounds: Normal breath sounds. No wheezing.  Abdominal:     General: Bowel sounds are normal. There is no distension.     Palpations: Abdomen is soft.     Tenderness: There is no abdominal tenderness. There is no guarding or rebound.  Musculoskeletal:        General: No swelling. Normal range of motion.     Cervical back: Neck supple.  Lymphadenopathy:     Cervical: No cervical adenopathy.  Skin:    General: Skin is warm and dry.     Findings: No rash.  Neurological:     Mental Status: He is alert and oriented to person, place, and time.     Coordination: Coordination normal.  Psychiatric:        Behavior: Behavior normal.       Assessment & Plan:   Problem List Items Addressed This Visit       Respiratory   Allergic rhinitis   Relevant Medications   loratadine (CLARITIN) 10 MG tablet     Other   GAD (generalized anxiety disorder)   Relevant Medications   escitalopram (LEXAPRO) 10 MG tablet   Hyperlipidemia   Relevant Orders   CBC with Differential/Platelet   CMP14+EGFR   Lipid panel   Insomnia   Other Visit Diagnoses     Physical exam    -  Primary   Chronic midline low back pain with bilateral sciatica       Relevant Medications   escitalopram (LEXAPRO) 10 MG tablet   diclofenac Sodium (VOLTAREN) 1 % GEL   Prostate cancer screening       Relevant Orders   PSA, total and free       Seems to be doing well for the most part, will check blood work today.  No changes, allowing  permissive hypertension in the 140s. Follow up plan: Return in about  1 year (around 02/25/2023), or if symptoms worsen or fail to improve, for physical.  Counseling provided for all of the vaccine components Orders Placed This Encounter  Procedures   CBC with Differential/Platelet   CMP14+EGFR   Lipid panel   PSA, total and free    Caryl Pina, MD [facility] 02/24/2022, 10:16 AM

## 2022-02-25 LAB — LIPID PANEL
Chol/HDL Ratio: 4.4 ratio (ref 0.0–5.0)
Cholesterol, Total: 228 mg/dL — ABNORMAL HIGH (ref 100–199)
HDL: 52 mg/dL (ref >39)
LDL Chol Calc (NIH): 153 mg/dL — ABNORMAL HIGH (ref 0–99)
Triglycerides: 128 mg/dL (ref 0–149)
VLDL Cholesterol Cal: 23 mg/dL (ref 5–40)

## 2022-02-25 LAB — CMP14+EGFR
ALT: 13 IU/L (ref 0–44)
AST: 19 IU/L (ref 0–40)
Albumin/Globulin Ratio: 1.7 (ref 1.2–2.2)
Albumin: 4.1 g/dL (ref 3.7–4.7)
Alkaline Phosphatase: 102 IU/L (ref 44–121)
BUN/Creatinine Ratio: 12 (ref 10–24)
BUN: 11 mg/dL (ref 8–27)
Bilirubin Total: 0.4 mg/dL (ref 0.0–1.2)
CO2: 25 mmol/L (ref 20–29)
Calcium: 9.3 mg/dL (ref 8.6–10.2)
Chloride: 102 mmol/L (ref 96–106)
Creatinine, Ser: 0.9 mg/dL (ref 0.76–1.27)
Globulin, Total: 2.4 g/dL (ref 1.5–4.5)
Glucose: 88 mg/dL (ref 70–99)
Potassium: 4.7 mmol/L (ref 3.5–5.2)
Sodium: 141 mmol/L (ref 134–144)
Total Protein: 6.5 g/dL (ref 6.0–8.5)
eGFR: 83 mL/min/{1.73_m2} (ref >59)

## 2022-02-25 LAB — CBC WITH DIFFERENTIAL/PLATELET
Basophils Absolute: 0.1 10*3/uL (ref 0.0–0.2)
Basos: 1 %
EOS (ABSOLUTE): 0.4 10*3/uL (ref 0.0–0.4)
Eos: 6 %
Hematocrit: 42.4 % (ref 37.5–51.0)
Hemoglobin: 14.7 g/dL (ref 13.0–17.7)
Immature Grans (Abs): 0 10*3/uL (ref 0.0–0.1)
Immature Granulocytes: 0 %
Lymphocytes Absolute: 1.9 10*3/uL (ref 0.7–3.1)
Lymphs: 30 %
MCH: 32.7 pg (ref 26.6–33.0)
MCHC: 34.7 g/dL (ref 31.5–35.7)
MCV: 94 fL (ref 79–97)
Monocytes Absolute: 0.6 10*3/uL (ref 0.1–0.9)
Monocytes: 10 %
Neutrophils Absolute: 3.2 10*3/uL (ref 1.4–7.0)
Neutrophils: 53 %
Platelets: 289 10*3/uL (ref 150–450)
RBC: 4.49 x10E6/uL (ref 4.14–5.80)
RDW: 12.3 % (ref 11.6–15.4)
WBC: 6.1 10*3/uL (ref 3.4–10.8)

## 2022-02-25 LAB — PSA, TOTAL AND FREE
PSA, Free Pct: 22.4 %
PSA, Free: 0.47 ng/mL
Prostate Specific Ag, Serum: 2.1 ng/mL (ref 0.0–4.0)

## 2022-02-28 ENCOUNTER — Telehealth: Payer: Self-pay | Admitting: Family Medicine

## 2022-02-28 NOTE — Telephone Encounter (Signed)
Please call patient with lab results

## 2022-03-01 MED ORDER — ROSUVASTATIN CALCIUM 5 MG PO TABS: 5.0000 mg | ORAL_TABLET | Freq: Every day | ORAL | 3 refills | Status: DC

## 2022-03-01 NOTE — Telephone Encounter (Signed)
Pt aware of results and recommendations per result notes and pt is ok with starting the crestor '5mg'$ . Rx sent to pharmacy.

## 2022-03-01 NOTE — Telephone Encounter (Signed)
Patient calling back to check on labs results. Please call back

## 2022-03-24 ENCOUNTER — Telehealth: Payer: Self-pay | Admitting: Family Medicine

## 2022-03-24 NOTE — Telephone Encounter (Signed)
Left message for patient to call back and schedule Medicare Annual Wellness Visit (AWV) to be completed by video or phone.   Last AWV: 03/01/2021   Please schedule at anytime with [facility] Nurse Health Advisor     Any questions, please contact me at [phone]   Thank you,   Stephanie [facility] Clinical Support for [facility]  Care Management /[facility] You Are. We Are. [facility] ??[phone] or ??[phone]    

## 2022-03-27 ENCOUNTER — Ambulatory Visit (INDEPENDENT_AMBULATORY_CARE_PROVIDER_SITE_OTHER): Payer: Medicare Other

## 2022-03-27 VITALS — Ht 64.0 in | Wt 138.0 lb

## 2022-03-27 DIAGNOSIS — Z0000 Encounter for general adult medical examination without abnormal findings: Secondary | ICD-10-CM

## 2022-03-27 NOTE — Patient Instructions (Signed)
[name] , Thank you for taking time to come for your Medicare Wellness Visit. I appreciate your ongoing commitment to your health goals. Please review the following plan we discussed and let me know if I can assist you in the future.   These are the goals we discussed:  Goals      Exercise 150 minutes per week (moderate activity)     Exercise 3x per week (30 min per time)     Patient Stated     03/01/2021 AWV Goal: Keep All Scheduled Appointments  Over the next year, patient will attend all scheduled appointments with their PCP and any specialists that they see.         This is a list of the screening recommended for you and due dates:  Health Maintenance  Topic Date Due   COVID-19 Vaccine (4 - 2023-24 season) 11/11/2021   Medicare Annual Wellness Visit  03/28/2023   DTaP/Tdap/Td vaccine (3 - Td or Tdap) 05/17/2031   Pneumonia Vaccine  Completed   Flu Shot  Completed   Zoster (Shingles) Vaccine  Completed   HPV Vaccine  Aged Out    Advanced directives: Advance directive discussed with you today. I have provided a copy for you to complete at home and have notarized. Once this is complete please bring a copy in to our office so we can scan it into your chart.   Conditions/risks identified: Aim for 30 minutes of exercise or brisk walking, 6-8 glasses of water, and 5 servings of fruits and vegetables each day.   Next appointment: Follow up in one year for your annual wellness visit.   Preventive Care 60 Years and Older, Male  Preventive care refers to lifestyle choices and visits with your health care provider that can promote health and wellness. What does preventive care include? A yearly physical exam. This is also called an annual well check. Dental exams once or twice a year. Routine eye exams. Ask your health care provider how often you should have your eyes checked. Personal lifestyle choices, including: Daily care of your teeth and gums. Regular physical  activity. Eating a healthy diet. Avoiding tobacco and drug use. Limiting alcohol use. Practicing safe sex. Taking low doses of aspirin every day. Taking vitamin and mineral supplements as recommended by your health care provider. What happens during an annual well check? The services and screenings done by your health care provider during your annual well check will depend on your age, overall health, lifestyle risk factors, and family history of disease. Counseling  Your health care provider may ask you questions about your: Alcohol use. Tobacco use. Drug use. Emotional well-being. Home and relationship well-being. Sexual activity. Eating habits. History of falls. Memory and ability to understand (cognition). Work and work Statistician. Screening  You may have the following tests or measurements: Height, weight, and BMI. Blood pressure. Lipid and cholesterol levels. These may be checked every 5 years, or more frequently if you are over 21 years old. Skin check. Lung cancer screening. You may have this screening every year starting at age 54 if you have a 30-pack-year history of smoking and currently smoke or have quit within the past 15 years. Fecal occult blood test (FOBT) of the stool. You may have this test every year starting at age 66. Flexible sigmoidoscopy or colonoscopy. You may have a sigmoidoscopy every 5 years or a colonoscopy every 10 years starting at age 97. Prostate cancer screening. Recommendations will vary depending on your family history  and other risks. Hepatitis C blood test. Hepatitis B blood test. Sexually transmitted disease (STD) testing. Diabetes screening. This is done by checking your blood sugar (glucose) after you have not eaten for a while (fasting). You may have this done every 1-3 years. Abdominal aortic aneurysm (AAA) screening. You may need this if you are a current or former smoker. Osteoporosis. You may be screened starting at age 9 if you are  at high risk. Talk with your health care provider about your test results, treatment options, and if necessary, the need for more tests. Vaccines  Your health care provider may recommend certain vaccines, such as: Influenza vaccine. This is recommended every year. Tetanus, diphtheria, and acellular pertussis (Tdap, Td) vaccine. You may need a Td booster every 10 years. Zoster vaccine. You may need this after age 42. Pneumococcal 13-valent conjugate (PCV13) vaccine. One dose is recommended after age 48. Pneumococcal polysaccharide (PPSV23) vaccine. One dose is recommended after age 17. Talk to your health care provider about which screenings and vaccines you need and how often you need them. This information is not intended to replace advice given to you by your health care provider. Make sure you discuss any questions you have with your health care provider. Document Released: 03/26/2015 Document Revised: 11/17/2015 Document Reviewed: 12/29/2014 Elsevier Interactive Patient Education  2017 [place] Prevention in the Home Falls can cause injuries. They can happen to people of all ages. There are many things you can do to make your home safe and to help prevent falls. What can I do on the outside of my home? Regularly fix the edges of walkways and driveways and fix any cracks. Remove anything that might make you trip as you walk through a door, such as a raised step or threshold. Trim any bushes or trees on the path to your home. Use bright outdoor lighting. Clear any walking paths of anything that might make someone trip, such as rocks or tools. Regularly check to see if handrails are loose or broken. Make sure that both sides of any steps have handrails. Any raised decks and porches should have guardrails on the edges. Have any leaves, snow, or ice cleared regularly. Use sand or salt on walking paths during winter. Clean up any spills in your garage right away. This includes oil  or grease spills. What can I do in the bathroom? Use night lights. Install grab bars by the toilet and in the tub and shower. Do not use towel bars as grab bars. Use non-skid mats or decals in the tub or shower. If you need to sit down in the shower, use a plastic, non-slip stool. Keep the floor dry. Clean up any water that spills on the floor as soon as it happens. Remove soap buildup in the tub or shower regularly. Attach bath mats securely with double-sided non-slip rug tape. Do not have throw rugs and other things on the floor that can make you trip. What can I do in the bedroom? Use night lights. Make sure that you have a light by your bed that is easy to reach. Do not use any sheets or blankets that are too big for your bed. They should not hang down onto the floor. Have a firm chair that has side arms. You can use this for support while you get dressed. Do not have throw rugs and other things on the floor that can make you trip. What can I do in the kitchen? Clean up any spills  right away. Avoid walking on wet floors. Keep items that you use a lot in easy-to-reach places. If you need to reach something above you, use a strong step stool that has a grab bar. Keep electrical cords out of the way. Do not use floor polish or wax that makes floors slippery. If you must use wax, use non-skid floor wax. Do not have throw rugs and other things on the floor that can make you trip. What can I do with my stairs? Do not leave any items on the stairs. Make sure that there are handrails on both sides of the stairs and use them. Fix handrails that are broken or loose. Make sure that handrails are as long as the stairways. Check any carpeting to make sure that it is firmly attached to the stairs. Fix any carpet that is loose or worn. Avoid having throw rugs at the top or bottom of the stairs. If you do have throw rugs, attach them to the floor with carpet tape. Make sure that you have a light  switch at the top of the stairs and the bottom of the stairs. If you do not have them, ask someone to add them for you. What else can I do to help prevent falls? Wear shoes that: Do not have high heels. Have rubber bottoms. Are comfortable and fit you well. Are closed at the toe. Do not wear sandals. If you use a stepladder: Make sure that it is fully opened. Do not climb a closed stepladder. Make sure that both sides of the stepladder are locked into place. Ask someone to hold it for you, if possible. Clearly mark and make sure that you can see: Any grab bars or handrails. First and last steps. Where the edge of each step is. Use tools that help you move around (mobility aids) if they are needed. These include: Canes. Walkers. Scooters. Crutches. Turn on the lights when you go into a dark area. Replace any light bulbs as soon as they burn out. Set up your furniture so you have a clear path. Avoid moving your furniture around. If any of your floors are uneven, fix them. If there are any pets around you, be aware of where they are. Review your medicines with your doctor. Some medicines can make you feel dizzy. This can increase your chance of falling. Ask your doctor what other things that you can do to help prevent falls. This information is not intended to replace advice given to you by your health care provider. Make sure you discuss any questions you have with your health care provider. Document Released: 12/24/2008 Document Revised: 08/05/2015 Document Reviewed: 04/03/2014 Elsevier Interactive Patient Education  2017 [employer].

## 2022-03-27 NOTE — Progress Notes (Signed)
Subjective:   Craig Baird is a 89 y.o. male who presents for Medicare Annual/Subsequent preventive examination. I connected with  Jakiah C Heidemann on 03/27/22 by a audio enabled telemedicine application and verified that I am speaking with the correct person using two identifiers.  Patient Location: Home  Provider Location: Home Office  I discussed the limitations of evaluation and management by telemedicine. The patient expressed understanding and agreed to proceed.  Review of Systems     Cardiac Risk Factors include: advanced age (>41mn, >>17women);male gender     Objective:    Today's Vitals   03/27/22 1459  Weight: 138 lb (62.6 kg)  Height: '5\' 4"'$  (1.626 m)   Body mass index is 23.69 kg/m.     03/27/2022    3:02 PM 03/01/2021    1:18 PM 03/01/2021   10:43 AM 11/29/2017   10:04 AM 11/28/2016    3:22 PM 10/27/2016    7:42 PM 09/15/2016   12:20 PM  Advanced Directives  Does Patient Have a Medical Advance Directive? Yes No No No No No No  Type of [place]        Does patient want to make changes to medical advance directive?     Yes (MAU/Ambulatory/Procedural Areas - Information given)    Copy of [place]? No - copy requested    No - copy requested    Would patient like information on creating a medical advance directive?  No - Patient declined No - Patient declined Yes (MAU/Ambulatory/Procedural Areas - Information given) Yes (MAU/Ambulatory/Procedural Areas - Information given)  No - Patient declined    Current Medications (verified) Outpatient Encounter Medications as of 03/27/2022  Medication Sig   diclofenac Sodium (VOLTAREN) 1 % GEL Apply 2 g topically 4 (four) times daily.   escitalopram (LEXAPRO) 10 MG tablet Take 1 tablet (10 mg total) by mouth daily.   fluticasone (FLONASE) 50 MCG/ACT nasal spray Place 1 spray into both nostrils 2 (two) times daily as needed for allergies or  rhinitis.   loratadine (CLARITIN) 10 MG tablet Take 1 tablet (10 mg total) by mouth daily as needed for allergies, rhinitis or itching.   rosuvastatin (CRESTOR) 5 MG tablet Take 1 tablet (5 mg total) by mouth daily.   No facility-administered encounter medications on file as of 03/27/2022.    Allergies (verified) Sulfa antibiotics   History: Past Medical History:  Diagnosis Date   Allergy    Anxiety    Cataract    Diverticulosis    Hernia    L inguinal hernia   Hiatal hernia    Hyperlipidemia    SCCA (squamous cell carcinoma) of skin 09/22/2019   Right Forearem-posterior (well diff) (treatment after biopsy)   Sinusitis    Past Surgical History:  Procedure Laterality Date   COLONOSCOPY N/A 09/15/2016   Dr. RGala Romney Diverticulosis, several polyps removed, couple of tubular adenomas.  No future surveillance colonoscopies recommended given age.   ESOPHAGOGASTRODUODENOSCOPY N/A 09/15/2016   Dr. RGala Romney small hiatal hernia.   THYROID CYST EXCISION  1973   TONSILLECTOMY     Family History  Problem Relation Age of Onset   Hypertension Mother    Stroke Mother    Parkinson's disease Mother    Hip fracture Mother    Hypertension Brother 570  Heart disease Brother    Birth defects Father        spine   Early death Sister  Early death Sister    Colon cancer Neg Hx    Gastric cancer Neg Hx    Esophageal cancer Neg Hx    Social History   Socioeconomic History   Marital status: Married    Spouse name: Cotter    Number of children: 2   Years of education: Not on file   Highest education level: 7th grade  Occupational History   Not on file  Tobacco Use   Smoking status: Never   Smokeless tobacco: Never  Vaping Use   Vaping Use: Never used  Substance and Sexual Activity   Alcohol use: No   Drug use: No   Sexual activity: Yes  Other Topics Concern   Not on file  Social History Narrative   Retired, lives with wife Avenel, two grown children. Enjoys working around the  house.    Social Determinants of Health   Financial Resource Strain: Low Risk  (03/27/2022)   Overall Financial Resource Strain (CARDIA)    Difficulty of Paying Living Expenses: Not hard at all  Food Insecurity: No Food Insecurity (03/27/2022)   Hunger Vital Sign    Worried About Running Out of Food in the Last Year: Never true    Ran Out of Food in the Last Year: Never true  Transportation Needs: No Transportation Needs (03/27/2022)   PRAPARE - Hydrologist (Medical): No    Lack of Transportation (Non-Medical): No  Physical Activity: Insufficiently Active (03/27/2022)   Exercise Vital Sign    Days of Exercise per Week: 3 days    Minutes of Exercise per Session: 30 min  Stress: No Stress Concern Present (03/27/2022)   [place]    Feeling of Stress : Not at all  Social Connections: Moderately Integrated (03/27/2022)   Social Connection and Isolation Panel [NHANES]    Frequency of Communication with Friends and Family: More than three times a week    Frequency of Social Gatherings with Friends and Family: More than three times a week    Attends Religious Services: More than 4 times per year    Active Member of [employer] or Organizations: No    Attends Music therapist: Never    Marital Status: Married    Tobacco Counseling Counseling given: Not Answered   Clinical Intake:  Pre-visit preparation completed: Yes  Pain : No/denies pain     Nutritional Risks: None Diabetes: No  How often do you need to have someone help you when you read instructions, pamphlets, or other written materials from your doctor or pharmacy?: 1 - Never  Diabetic?no   Interpreter Needed?: No  Information entered by :: Jadene Pierini, LPN   Activities of Daily Living    03/27/2022    3:02 PM  In your present state of health, do you have any difficulty performing the following activities:   Hearing? 0  Vision? 0  Difficulty concentrating or making decisions? 0  Walking or climbing stairs? 0  Dressing or bathing? 0  Doing errands, shopping? 0  Preparing Food and eating ? N  Using the Toilet? N  In the past six months, have you accidently leaked urine? N  Do you have problems with loss of bowel control? N  Managing your Medications? N  Managing your Finances? N  Housekeeping or managing your Housekeeping? N    Patient Care Team: Dettinger, Fransisca Kaufmann, MD as PCP - General (Family Medicine) Lavonna Monarch, MD (Inactive) as  Consulting Physician (Dermatology) Gala Romney, Cristopher Estimable, MD as Consulting Physician (Gastroenterology)  Indicate any recent Medical Services you may have received from other than [facility] providers in the past year (date may be approximate).     Assessment:   This is a routine wellness examination for [place].  Hearing/Vision screen Vision Screening - Comments:: Wears rx glasses - up to date with routine eye exams with  Dr,Johnson   Dietary issues and exercise activities discussed: Current Exercise Habits: Home exercise routine, Type of exercise: walking, Time (Minutes): 30, Frequency (Times/Week): 3, Weekly Exercise (Minutes/Week): 90, Intensity: Mild, Exercise limited by: None identified   Goals Addressed             This Visit's Progress    Exercise 150 minutes per week (moderate activity)   On track      Depression Screen    03/27/2022    3:01 PM 02/24/2022   10:04 AM 11/08/2021   12:22 PM 09/01/2021   12:00 PM 03/01/2021   10:43 AM 02/23/2021   10:15 AM 07/29/2020   11:24 AM  PHQ 2/9 Scores  PHQ - 2 Score 0 0 1 2 0 0 2  PHQ- 9 Score 0  '2 6  4 4    '$ Fall Risk    03/27/2022    3:00 PM 02/24/2022   10:04 AM 11/08/2021   12:23 PM 09/01/2021   11:59 AM 03/01/2021   10:43 AM  [place] in the past year? 0 0 1 0 0  Number falls in past yr: 0  0    Injury with Fall? 0  0    Risk for fall due to : No Fall Risks      Follow up Falls  prevention discussed        [place]:  Any stairs in or around the home? No  If so, are there any without handrails? No  Home free of loose throw rugs in walkways, pet beds, electrical cords, etc? Yes  Adequate lighting in your home to reduce risk of falls? Yes   ASSISTIVE DEVICES UTILIZED TO PREVENT FALLS:  Life alert? No  Use of a cane, walker or w/c? No  Grab bars in the bathroom? Yes  Shower chair or bench in shower? No  Elevated toilet seat or a handicapped toilet? No       11/29/2017   10:11 AM 11/28/2016    3:28 PM 11/23/2015    2:26 PM 07/08/2014    3:52 PM 07/08/2014    3:47 PM  MMSE - Mini Mental State Exam  Orientation to time '5 5 5  5  '$ Orientation to Place '5 5 5  5  '$ Registration '3 3 3  3  '$ Attention/ Calculation '5 4 5  '$ 0  Recall '2 3 2  3  '$ Language- name 2 objects '2 2 2  2  '$ Language- repeat '1 1 1  '$ 0  Language- follow 3 step command '3 3 3  3  '$ Language- read & follow direction '1 1 1  1  '$ Write a sentence '1 1 1 1   '$ Copy design 1 0 1 1   Total score '29 28 29          '$ 03/27/2022    3:03 PM 03/01/2021   10:46 AM  6CIT Screen  What Year? 0 points 0 points  What month? 0 points 0 points  What time? 0 points 0 points  Count back from 20 0  points 0 points  Months in reverse 0 points 0 points  Repeat phrase 0 points 10 points  Total Score 0 points 10 points    Immunizations Immunization History  Administered Date(s) Administered   Influenza Split 11/21/2012, 11/16/2019   Influenza, High Dose Seasonal PF 11/13/2013, 11/23/2017, 11/12/2018   Influenza,inj,Quad PF,6+ Mos 12/09/2015   Influenza-Unspecified 11/11/2020, 11/19/2021   Moderna Sars-Covid-2 Vaccination 05/30/2019, 07/01/2019, 02/12/2020   PNEUMOCOCCAL CONJUGATE-20 11/01/2021   Pneumococcal Conjugate-13 07/08/2014   Pneumococcal Polysaccharide-23 08/21/2007, 02/11/2011   Pneumococcal-Unspecified 11/29/2014   Tdap 11/23/2010, 05/16/2021   Zoster Recombinat (Shingrix)  03/17/2021, 05/16/2021    TDAP status: Up to date  Flu Vaccine status: Up to date  Pneumococcal vaccine status: Up to date  Covid-19 vaccine status: Completed vaccines  Qualifies for Shingles Vaccine? Yes   Zostavax completed Yes   Shingrix Completed?: Yes  Screening Tests Health Maintenance  Topic Date Due   COVID-19 Vaccine (4 - 2023-24 season) 11/11/2021   Medicare Annual Wellness (AWV)  03/28/2023   DTaP/Tdap/Td (3 - Td or Tdap) 05/17/2031   Pneumonia Vaccine 1+ Years old  Completed   INFLUENZA VACCINE  Completed   Zoster Vaccines- Shingrix  Completed   HPV VACCINES  Aged Out    Health Maintenance  Health Maintenance Due  Topic Date Due   COVID-19 Vaccine (4 - 2023-24 season) 11/11/2021    Colorectal cancer screening: No longer required.   Lung Cancer Screening: (Low Dose CT Chest recommended if Age 31-80 years, 30 pack-year currently smoking OR have quit w/in 15years.) does not qualify.   Lung Cancer Screening Referral: n/a  Additional Screening:  Hepatitis C Screening: does not qualify;  Vision Screening: Recommended annual ophthalmology exams for early detection of glaucoma and other disorders of the eye. Is the patient up to date with their annual eye exam?  Yes  Who is the provider or what is the name of the office in which the patient attends annual eye exams? Dr.Johnson  If pt is not established with a provider, would they like to be referred to a provider to establish care? No .   Dental Screening: Recommended annual dental exams for proper oral hygiene  Community Resource Referral / Chronic Care Management: CRR required this visit?  No   CCM required this visit?  No      Plan:     I have personally reviewed and noted the following in the patient's chart:   Medical and social history Use of alcohol, tobacco or illicit drugs  Current medications and supplements including opioid prescriptions. Patient is not currently taking opioid  prescriptions. Functional ability and status Nutritional status Physical activity Advanced directives List of other physicians Hospitalizations, surgeries, and ER visits in previous 12 months Vitals Screenings to include cognitive, depression, and falls Referrals and appointments  In addition, I have reviewed and discussed with patient certain preventive protocols, quality metrics, and best practice recommendations. A written personalized care plan for preventive services as well as general preventive health recommendations were provided to patient.     Daphane Shepherd, LPN   [id number]   Nurse Notes: none

## 2022-04-04 DIAGNOSIS — H2512 Age-related nuclear cataract, left eye: Secondary | ICD-10-CM | POA: Diagnosis not present

## 2022-04-04 DIAGNOSIS — H2511 Age-related nuclear cataract, right eye: Secondary | ICD-10-CM | POA: Diagnosis not present

## 2022-04-10 DIAGNOSIS — L298 Other pruritus: Secondary | ICD-10-CM | POA: Diagnosis not present

## 2022-04-10 DIAGNOSIS — L218 Other seborrheic dermatitis: Secondary | ICD-10-CM | POA: Diagnosis not present

## 2022-04-10 DIAGNOSIS — D225 Melanocytic nevi of trunk: Secondary | ICD-10-CM | POA: Diagnosis not present

## 2022-04-10 DIAGNOSIS — X32XXXA Exposure to sunlight, initial encounter: Secondary | ICD-10-CM | POA: Diagnosis not present

## 2022-04-10 DIAGNOSIS — L820 Inflamed seborrheic keratosis: Secondary | ICD-10-CM | POA: Diagnosis not present

## 2022-04-10 DIAGNOSIS — L570 Actinic keratosis: Secondary | ICD-10-CM | POA: Diagnosis not present

## 2022-04-20 DIAGNOSIS — H2511 Age-related nuclear cataract, right eye: Secondary | ICD-10-CM | POA: Diagnosis not present

## 2022-04-20 DIAGNOSIS — H269 Unspecified cataract: Secondary | ICD-10-CM | POA: Diagnosis not present

## 2022-05-11 DIAGNOSIS — H2512 Age-related nuclear cataract, left eye: Secondary | ICD-10-CM | POA: Diagnosis not present

## 2022-05-11 DIAGNOSIS — H269 Unspecified cataract: Secondary | ICD-10-CM | POA: Diagnosis not present

## 2022-05-18 DIAGNOSIS — H5213 Myopia, bilateral: Secondary | ICD-10-CM | POA: Diagnosis not present

## 2022-05-31 ENCOUNTER — Encounter: Payer: Self-pay | Admitting: Family Medicine

## 2022-05-31 ENCOUNTER — Ambulatory Visit (INDEPENDENT_AMBULATORY_CARE_PROVIDER_SITE_OTHER): Payer: Medicare Other | Admitting: Family Medicine

## 2022-05-31 VITALS — BP 126/62 | HR 77 | Temp 97.9°F | Ht 64.0 in | Wt 145.0 lb

## 2022-05-31 DIAGNOSIS — M170 Bilateral primary osteoarthritis of knee: Secondary | ICD-10-CM

## 2022-05-31 DIAGNOSIS — M1990 Unspecified osteoarthritis, unspecified site: Secondary | ICD-10-CM

## 2022-05-31 NOTE — Progress Notes (Signed)
Subjective:  Patient ID: Craig Baird, male    DOB: Aug 30, 1933  Age: 89 y.o. MRN: [id number]  CC: Knee Pain (bilateral)   HPI Craig Baird presents for arthritis pain in the knees and back. Has been told Craig Baird needs replacements, but trying to avoid surgery. Recent increase in pain. Has had relief with joint injections in the past.      05/31/2022   10:57 AM 05/31/2022   10:56 AM 03/27/2022    3:01 PM  Depression screen PHQ 2/9  Decreased Interest 1 0 0  Down, Depressed, Hopeless 0 0 0  PHQ - 2 Score 1 0 0  Altered sleeping 1  0  Tired, decreased energy 1  0  Change in appetite 0  0  Feeling bad or failure about yourself  0  0  Trouble concentrating 0  0  Moving slowly or fidgety/restless 0  0  Suicidal thoughts 0  0  PHQ-9 Score 3  0  Difficult doing work/chores Not difficult at all  Not difficult at all    History Craig Baird has a past medical history of Allergy, Anxiety, Cataract, Diverticulosis, Hernia, Hiatal hernia, Hyperlipidemia, SCCA (squamous cell carcinoma) of skin (09/22/2019), and Sinusitis.   Craig Baird has a past surgical history that includes Tonsillectomy; Thyroid cyst excision (1973); Colonoscopy (N/A, 09/15/2016); and Esophagogastroduodenoscopy (N/A, 09/15/2016).   His family history includes Birth defects in his father; Early death in his sister and sister; Heart disease in his brother; Hip fracture in his mother; Hypertension in his mother; Hypertension (age of onset: 87) in his brother; Parkinson's disease in his mother; Stroke in his mother.Craig Baird reports that Craig Baird has never smoked. Craig Baird has never used smokeless tobacco. Craig Baird reports that Craig Baird does not drink alcohol and does not use drugs.    ROS Review of Systems  Constitutional:  Positive for activity change. Negative for fever.  HENT: Negative.    Respiratory: Negative.    Cardiovascular: Negative.   Musculoskeletal:  Positive for arthralgias (knees) and back pain.    Objective:  BP 126/62   Pulse 77   Temp 97.9  F (36.6 C)   Ht 5\' 4"  (1.626 m)   Wt 145 lb (65.8 kg)   BMI 24.89 kg/m   BP Readings from Last 3 Encounters:  05/31/22 126/62  02/24/22 (!) 148/74  11/08/21 136/68    Wt Readings from Last 3 Encounters:  05/31/22 145 lb (65.8 kg)  03/27/22 138 lb (62.6 kg)  02/24/22 139 lb (63 kg)     Physical Exam Constitutional:      Appearance: Normal appearance.  Cardiovascular:     Rate and Rhythm: Normal rate and regular rhythm.  Musculoskeletal:        General: Tenderness (both knees) present. Normal range of motion.  Skin:    General: Skin is warm and dry.  Neurological:     Mental Status: Craig Baird is alert.  Psychiatric:        Mood and Affect: Mood normal.        Behavior: Behavior normal.       Assessment & Plan:   Craig Baird was seen today for knee pain.  Diagnoses and all orders for this visit:  Arthritis    After obtaining informed consent, A steroid injection was performed at tboth knees. The right lateral compartment and left medial compartment were cleansed with betadine. With 1.5 inch, 23 gauge needle using 2.5 ml marcan and 9 mg of Celestone the joint space was entered and injected. This  was well tolerated. Simple dressing applied.     I am having Craig Baird maintain his fluticasone, escitalopram, diclofenac Sodium, loratadine, and rosuvastatin.  Allergies as of 05/31/2022       Reactions   Sulfa Antibiotics Rash        Medication List        Accurate as of May 31, 2022  8:51 PM. If you have any questions, ask your nurse or doctor.          diclofenac Sodium 1 % Gel Commonly known as: Voltaren Apply 2 g topically 4 (four) times daily.   escitalopram 10 MG tablet Commonly known as: Lexapro Take 1 tablet (10 mg total) by mouth daily.   fluticasone 50 MCG/ACT nasal spray Commonly known as: FLONASE Place 1 spray into both nostrils 2 (two) times daily as needed for allergies or rhinitis.   loratadine 10 MG tablet Commonly known as:  CLARITIN Take 1 tablet (10 mg total) by mouth daily as needed for allergies, rhinitis or itching.   rosuvastatin 5 MG tablet Commonly known as: Crestor Take 1 tablet (5 mg total) by mouth daily.         Follow-up: No follow-ups on file.  Claretta Fraise, M.D.

## 2022-12-07 ENCOUNTER — Ambulatory Visit (INDEPENDENT_AMBULATORY_CARE_PROVIDER_SITE_OTHER): Payer: Medicare Other | Admitting: Family Medicine

## 2022-12-07 ENCOUNTER — Encounter: Payer: Self-pay | Admitting: Family Medicine

## 2022-12-07 VITALS — BP 127/60 | HR 85 | Ht 64.0 in | Wt 140.0 lb

## 2022-12-07 DIAGNOSIS — F411 Generalized anxiety disorder: Secondary | ICD-10-CM

## 2022-12-07 DIAGNOSIS — M170 Bilateral primary osteoarthritis of knee: Secondary | ICD-10-CM | POA: Diagnosis not present

## 2022-12-07 MED ORDER — ESCITALOPRAM OXALATE 20 MG PO TABS: 20.0000 mg | ORAL_TABLET | Freq: Every day | ORAL | 1 refills | Status: DC

## 2022-12-07 NOTE — Progress Notes (Signed)
BP 127/60   Pulse 85   Ht 5\' 4"  (1.626 m)   Wt 140 lb (63.5 kg)   SpO2 94%   BMI 24.03 kg/m    Subjective:   Patient ID: Bobie WILDON LADUE, male    DOB: 12-31-1933, 89 y.o.   MRN: [id number]  HPI: Trini C Meah is a 89 y.o. male presenting on 12/07/2022 for Knee Pain (bilateral)   HPI Bilateral knee osteoarthritis Bilateral knee osteoarthritis that has been causing him issues and we did try Voltaren gel and we tried some steroid injections and he got minimal relief from those.  He wants to discuss possible gel injections versus knee replacement and will do referral to orthopedic.  Patient says his anxiety and nerves are still elevated and the Lexapro has not helped as much.  He denies any side effects from it.  He would like to go or change medicine.  Relevant past medical, surgical, family and social history reviewed and updated as indicated. Interim medical history since our last visit reviewed. Allergies and medications reviewed and updated.  Review of Systems  Constitutional:  Negative for chills and fever.  Eyes:  Negative for visual disturbance.  Respiratory:  Negative for shortness of breath and wheezing.   Cardiovascular:  Negative for chest pain and leg swelling.  Musculoskeletal:  Negative for back pain and gait problem.  Skin:  Negative for rash.  Psychiatric/Behavioral:  Positive for dysphoric mood. Negative for self-injury, sleep disturbance and suicidal ideas. The patient is nervous/anxious.   All other systems reviewed and are negative.   Per HPI unless specifically indicated above   Allergies as of 12/07/2022       Reactions   Sulfa Antibiotics Rash        Medication List        Accurate as of December 07, 2022  9:39 AM. If you have any questions, ask your nurse or doctor.          diclofenac Sodium 1 % Gel Commonly known as: Voltaren Apply 2 g topically 4 (four) times daily.   escitalopram 20 MG tablet Commonly known as: Lexapro Take  1 tablet (20 mg total) by mouth daily. What changed:  medication strength how much to take Changed by: Elige Radon Alfreda Hammad   fluticasone 50 MCG/ACT nasal spray Commonly known as: FLONASE Place 1 spray into both nostrils 2 (two) times daily as needed for allergies or rhinitis.   loratadine 10 MG tablet Commonly known as: CLARITIN Take 1 tablet (10 mg total) by mouth daily as needed for allergies, rhinitis or itching.   rosuvastatin 5 MG tablet Commonly known as: Crestor Take 1 tablet (5 mg total) by mouth daily.         Objective:   BP 127/60   Pulse 85   Ht 5\' 4"  (1.626 m)   Wt 140 lb (63.5 kg)   SpO2 94%   BMI 24.03 kg/m   Wt Readings from Last 3 Encounters:  12/07/22 140 lb (63.5 kg)  05/31/22 145 lb (65.8 kg)  03/27/22 138 lb (62.6 kg)    Physical Exam Vitals and nursing note reviewed.  Constitutional:      General: He is not in acute distress.    Appearance: He is well-developed. He is not diaphoretic.  Eyes:     General: No scleral icterus.    Conjunctiva/sclera: Conjunctivae normal.  Neck:     Thyroid: No thyromegaly.  Cardiovascular:     Rate and Rhythm: Normal rate and regular  rhythm.     Heart sounds: Normal heart sounds. No murmur heard. Pulmonary:     Effort: Pulmonary effort is normal. No respiratory distress.     Breath sounds: Normal breath sounds. No wheezing.  Musculoskeletal:        General: No swelling. Normal range of motion.     Cervical back: Neck supple.  Lymphadenopathy:     Cervical: No cervical adenopathy.  Skin:    General: Skin is warm and dry.     Findings: No rash.  Neurological:     Mental Status: He is alert and oriented to person, place, and time.     Coordination: Coordination normal.  Psychiatric:        Behavior: Behavior normal.       Assessment & Plan:   Problem List Items Addressed This Visit       Other   GAD (generalized anxiety disorder) - Primary   Relevant Medications   escitalopram (LEXAPRO) 20  MG tablet   Other Visit Diagnoses     Primary osteoarthritis of both knees       Relevant Orders   Ambulatory referral to Orthopedic Surgery     Will do referral for orthopedic.  Increase Lexapro to 20 mg and sent prescription  Follow up plan: Return if symptoms worsen or fail to improve.  Counseling provided for all of the vaccine components Orders Placed This Encounter  Procedures   Ambulatory referral to Orthopedic Surgery    Arville Care, MD [facility] 12/07/2022, 9:39 AM

## 2022-12-20 ENCOUNTER — Telehealth: Payer: Self-pay | Admitting: Family Medicine

## 2022-12-20 ENCOUNTER — Other Ambulatory Visit (INDEPENDENT_AMBULATORY_CARE_PROVIDER_SITE_OTHER): Payer: Medicare Other

## 2022-12-20 ENCOUNTER — Ambulatory Visit: Payer: Medicare Other | Admitting: Orthopedic Surgery

## 2022-12-20 ENCOUNTER — Telehealth: Payer: Self-pay

## 2022-12-20 DIAGNOSIS — M170 Bilateral primary osteoarthritis of knee: Secondary | ICD-10-CM | POA: Diagnosis not present

## 2022-12-20 DIAGNOSIS — M25561 Pain in right knee: Secondary | ICD-10-CM

## 2022-12-20 DIAGNOSIS — M25562 Pain in left knee: Secondary | ICD-10-CM

## 2022-12-20 NOTE — Telephone Encounter (Signed)
Auth needed for bilat knee gel  

## 2022-12-20 NOTE — Telephone Encounter (Signed)
TC to pharmacy, they were waiting on approval for increased dose in elderly patient in order to dispense. Read 12/07/22 OV note: Increase Lexapro to 20 mg and sent prescription  Gave VO ok to dispense as written.

## 2022-12-21 ENCOUNTER — Encounter: Payer: Self-pay | Admitting: Orthopedic Surgery

## 2022-12-21 NOTE — Telephone Encounter (Signed)
VOB submitted for durolane, bilateral knee  

## 2022-12-21 NOTE — Progress Notes (Signed)
Office Visit Note   Patient: Craig Baird           Date of Birth: 10-02-1933           MRN: [id number] Visit Date: 12/20/2022 Requested by: Dettinger, Elige Radon, MD [address] [place],  [place] [address] PCP: Dettinger, Elige Radon, MD  Subjective: Chief Complaint  Patient presents with   Right Knee - Pain   Left Knee - Pain    HPI: Craig Baird is a 89 y.o. male who presents to the office reporting bilateral knee pain left worse than right.  Patient states "my knees are worn out".  Reports weakness giving way and swelling.  As well as night pain.  Patient describes limited walking endurance..                ROS: All systems reviewed are negative as they relate to the chief complaint within the history of present illness.  Patient denies fevers or chills.  Assessment & Plan: Visit Diagnoses:  1. Pain in both knees, unspecified chronicity   2. Primary osteoarthritis of both knees     Plan: Impression is end-stage knee arthritis in both knees.  Patient has not had great results with cortisone injections.  He would like to try gel injection.  We will get that set up for him.  Wants to avoid knee replacement if possible.  Follow-up in 3 weeks for bilateral knee gel injections.  Follow-Up Instructions: No follow-ups on file.   Orders:  Orders Placed This Encounter  Procedures   XR KNEE 3 VIEW RIGHT   XR Knee 1-2 Views Left   No orders of the defined types were placed in this encounter. This patient is diagnosed with osteoarthritis of the knee(s).    Radiographs show evidence of joint space narrowing, osteophytes, subchondral sclerosis and/or subchondral cysts.  This patient has knee pain which interferes with functional and activities of daily living.    This patient has experienced inadequate response, adverse effects and/or intolerance with conservative treatments such as acetaminophen, NSAIDS, topical creams, physical therapy or regular exercise, knee bracing and/or  weight loss.   This patient has experienced inadequate response or has a contraindication to intra articular steroid injections for at least 3 months.   This patient is not scheduled to have a total knee replacement within 6 months of starting treatment with viscosupplementation.     Procedures: No procedures performed   Clinical Data: No additional findings.  Objective: Vital Signs: There were no vitals taken for this visit.  Physical Exam:  Constitutional: Patient appears well-developed HEENT:  Head: Normocephalic Eyes:EOM are normal Neck: Normal range of motion Cardiovascular: Normal rate Pulmonary/chest: Effort normal Neurologic: Patient is alert Skin: Skin is warm Psychiatric: Patient has normal mood and affect  Ortho Exam: Ortho exam demonstrates intact extensor mechanism with perfused and sensate feet.  Collateral crucial ligaments are stable.  Patient has slight flexion contractures in both knees.  Does bend past 90 degrees.  No calf tenderness negative Homans.  Specialty Comments:  No specialty comments available.  Imaging: XR KNEE 3 VIEW RIGHT  Result Date: 12/21/2022 AP lateral merchant radiographs right knee reviewed.  No acute fracture.  Moderate arthritis is present in the medial lateral and patellofemoral compartments.  Alignment neutral  XR Knee 1-2 Views Left  Result Date: 12/21/2022 AP lateral merchant radiographs left knee reviewed.  Severe end-stage tricompartmental arthritis is present worse in the medial compartment with moderate arthritis in the lateral and  patellofemoral compartments.  No acute fracture.  Overall alignment neutral to slight varus    PMFS History: Patient Active Problem List   Diagnosis Date Noted   Chronic midline low back pain without sciatica 11/08/2021   Osteopenia 11/23/2015   GAD (generalized anxiety disorder) 10/23/2014   Insomnia 10/23/2014   Arthritis 10/23/2014   Hyperlipidemia 10/23/2014   Allergic rhinitis  08/12/2012   Past Medical History:  Diagnosis Date   Allergy    Anxiety    Cataract    Diverticulosis    Hernia    L inguinal hernia   Hiatal hernia    Hyperlipidemia    SCCA (squamous cell carcinoma) of skin 09/22/2019   Right Forearem-posterior (well diff) (treatment after biopsy)   Sinusitis     Family History  Problem Relation Age of Onset   Hypertension Mother    Stroke Mother    Parkinson's disease Mother    Hip fracture Mother    Hypertension Brother 21   Heart disease Brother    Birth defects Father        spine   Early death Sister    Early death Sister    Colon cancer Neg Hx    Gastric cancer Neg Hx    Esophageal cancer Neg Hx     Past Surgical History:  Procedure Laterality Date   COLONOSCOPY N/A 09/15/2016   Dr. Jena Gauss: Diverticulosis, several polyps removed, couple of tubular adenomas.  No future surveillance colonoscopies recommended given age.   ESOPHAGOGASTRODUODENOSCOPY N/A 09/15/2016   Dr. Jena Gauss: small hiatal hernia.   THYROID CYST EXCISION  1973   TONSILLECTOMY     Social History   Occupational History   Not on file  Tobacco Use   Smoking status: Never   Smokeless tobacco: Never  Vaping Use   Vaping status: Never Used  Substance and Sexual Activity   Alcohol use: No   Drug use: No   Sexual activity: Yes

## 2022-12-28 ENCOUNTER — Telehealth: Payer: Self-pay

## 2022-12-28 NOTE — Telephone Encounter (Signed)
Patient calling asking the status of the gel injection, he states he in a lot of pain

## 2022-12-29 ENCOUNTER — Other Ambulatory Visit: Payer: Self-pay

## 2022-12-29 DIAGNOSIS — M170 Bilateral primary osteoarthritis of knee: Secondary | ICD-10-CM

## 2022-12-29 NOTE — Telephone Encounter (Signed)
Tried calling patient to schedule for gel injection, but no answer and no VM setup to leave a message.   Patient will need an appt.with Dr. August Saucer or Franky Macho if he calls back  See referrals tab

## 2023-01-01 ENCOUNTER — Ambulatory Visit: Payer: Medicare Other | Admitting: Family

## 2023-01-02 ENCOUNTER — Encounter: Payer: Self-pay | Admitting: Family Medicine

## 2023-01-03 ENCOUNTER — Ambulatory Visit (INDEPENDENT_AMBULATORY_CARE_PROVIDER_SITE_OTHER): Payer: Medicare Other

## 2023-01-03 ENCOUNTER — Encounter: Payer: Self-pay | Admitting: Family Medicine

## 2023-01-03 NOTE — Progress Notes (Signed)
Patient was confused, had seen orthopedic and was wanting the gel shots and they told him to call his doctor and he was confused that he called here and said it was orthopedic.  Gave him the number for his orthopedic and we can set up today's visit for him.

## 2023-01-08 ENCOUNTER — Encounter: Payer: Self-pay | Admitting: Surgical

## 2023-01-08 ENCOUNTER — Ambulatory Visit: Payer: Medicare Other | Admitting: Surgical

## 2023-01-08 DIAGNOSIS — M170 Bilateral primary osteoarthritis of knee: Secondary | ICD-10-CM

## 2023-01-08 MED ORDER — LIDOCAINE HCL 1 % IJ SOLN
5.0000 mL | INTRAMUSCULAR | Status: AC | PRN
  Administered 2023-01-08: 5 mL

## 2023-01-08 MED ORDER — SODIUM HYALURONATE 60 MG/3ML IX PRSY
60.0000 mg | PREFILLED_SYRINGE | INTRA_ARTICULAR | Status: AC | PRN
  Administered 2023-01-08: 60 mg via INTRA_ARTICULAR

## 2023-01-08 NOTE — Progress Notes (Signed)
Procedure Note  Patient: Craig Baird             Date of Birth: 1934-02-23           MRN: [id number]             Visit Date: 01/08/2023  Procedures: Visit Diagnoses: No diagnosis found.  Large Joint Inj: bilateral knee on 01/08/2023 1:34 PM Indications: diagnostic evaluation, joint swelling and pain Details: 18 G 1.5 in needle, superolateral approach  Arthrogram: No  Medications (Right): 5 mL lidocaine 1 %; 60 mg Sodium Hyaluronate 60 MG/3ML Aspirate (Right): 15 mL Medications (Left): 5 mL lidocaine 1 %; 60 mg Sodium Hyaluronate 60 MG/3ML Outcome: tolerated well, no immediate complications Procedure, treatment alternatives, risks and benefits explained, specific risks discussed. Consent was given by the patient. Immediately prior to procedure a time out was called to verify the correct patient, procedure, equipment, support staff and site/side marked as required. Patient was prepped and draped in the usual sterile fashion.

## 2023-02-12 ENCOUNTER — Telehealth: Payer: Self-pay | Admitting: Surgical

## 2023-02-12 NOTE — Telephone Encounter (Signed)
Patient called and said that the GEL injection have not worked. CB#[id number]

## 2023-02-12 NOTE — Telephone Encounter (Signed)
Lvm for pt to cb to discuss  

## 2023-02-12 NOTE — Telephone Encounter (Signed)
Good information to note, thank you. If he wants to discuss other options for his knee pain, id recommend making appointment

## 2023-02-13 DIAGNOSIS — M79676 Pain in unspecified toe(s): Secondary | ICD-10-CM | POA: Diagnosis not present

## 2023-02-13 DIAGNOSIS — B351 Tinea unguium: Secondary | ICD-10-CM | POA: Diagnosis not present

## 2023-02-13 NOTE — Telephone Encounter (Signed)
Pt cb, scheduled him to see luke on Monday @ 2:15

## 2023-02-14 ENCOUNTER — Ambulatory Visit (HOSPITAL_BASED_OUTPATIENT_CLINIC_OR_DEPARTMENT_OTHER): Payer: Medicare Other | Admitting: Student

## 2023-02-19 ENCOUNTER — Ambulatory Visit: Payer: Medicare Other | Admitting: Surgical

## 2023-02-19 DIAGNOSIS — M170 Bilateral primary osteoarthritis of knee: Secondary | ICD-10-CM

## 2023-02-25 ENCOUNTER — Encounter: Payer: Self-pay | Admitting: Surgical

## 2023-02-25 MED ORDER — METHYLPREDNISOLONE ACETATE 40 MG/ML IJ SUSP
40.0000 mg | INTRAMUSCULAR | Status: AC | PRN
  Administered 2023-02-19: 40 mg via INTRA_ARTICULAR

## 2023-02-25 MED ORDER — LIDOCAINE HCL 1 % IJ SOLN
5.0000 mL | INTRAMUSCULAR | Status: AC | PRN
  Administered 2023-02-19: 5 mL

## 2023-02-25 MED ORDER — BUPIVACAINE HCL 0.25 % IJ SOLN
4.0000 mL | INTRAMUSCULAR | Status: AC | PRN
  Administered 2023-02-19: 4 mL via INTRA_ARTICULAR

## 2023-02-25 NOTE — Progress Notes (Signed)
   Procedure Note  Patient: Craig Baird             Date of Birth: 1933/05/26           MRN: [id number]             Visit Date: 02/19/2023  Procedures: Visit Diagnoses:  1. Primary osteoarthritis of both knees     Large Joint Inj: bilateral knee on 02/19/2023 11:35 AM Indications: diagnostic evaluation, joint swelling and pain Details: 18 G 1.5 in needle, superolateral approach  Arthrogram: No  Medications (Right): 5 mL lidocaine 1 %; 4 mL bupivacaine 0.25 %; 40 mg methylPREDNISolone acetate 40 MG/ML Medications (Left): 5 mL lidocaine 1 %; 4 mL bupivacaine 0.25 %; 40 mg methylPREDNISolone acetate 40 MG/ML Outcome: tolerated well, no immediate complications  Patient returns following bilateral knee gel injections on 01/08/2023.  Did not provide any relief for him.  He would like to try 1 more round of cortisone injections to see if these will provide any relief.  Not at the point where he wants to consider knee replacement surgery currently.  Follow-up as needed Procedure, treatment alternatives, risks and benefits explained, specific risks discussed. Consent was given by the patient. Immediately prior to procedure a time out was called to verify the correct patient, procedure, equipment, support staff and site/side marked as required. Patient was prepped and draped in the usual sterile fashion.

## 2023-02-28 DIAGNOSIS — L814 Other melanin hyperpigmentation: Secondary | ICD-10-CM | POA: Diagnosis not present

## 2023-02-28 DIAGNOSIS — X32XXXD Exposure to sunlight, subsequent encounter: Secondary | ICD-10-CM | POA: Diagnosis not present

## 2023-02-28 DIAGNOSIS — L570 Actinic keratosis: Secondary | ICD-10-CM | POA: Diagnosis not present

## 2023-02-28 DIAGNOSIS — D225 Melanocytic nevi of trunk: Secondary | ICD-10-CM | POA: Diagnosis not present

## 2023-02-28 DIAGNOSIS — D485 Neoplasm of uncertain behavior of skin: Secondary | ICD-10-CM | POA: Diagnosis not present

## 2023-02-28 DIAGNOSIS — C44629 Squamous cell carcinoma of skin of left upper limb, including shoulder: Secondary | ICD-10-CM | POA: Diagnosis not present

## 2023-03-01 ENCOUNTER — Encounter: Payer: Self-pay | Admitting: Family Medicine

## 2023-03-01 ENCOUNTER — Ambulatory Visit: Payer: Medicare Other | Admitting: Family Medicine

## 2023-03-01 VITALS — BP 135/62 | Temp 98.0°F | Ht 64.0 in | Wt 142.0 lb

## 2023-03-01 DIAGNOSIS — M5441 Lumbago with sciatica, right side: Secondary | ICD-10-CM | POA: Diagnosis not present

## 2023-03-01 DIAGNOSIS — Z0001 Encounter for general adult medical examination with abnormal findings: Secondary | ICD-10-CM | POA: Diagnosis not present

## 2023-03-01 DIAGNOSIS — G8929 Other chronic pain: Secondary | ICD-10-CM

## 2023-03-01 DIAGNOSIS — J309 Allergic rhinitis, unspecified: Secondary | ICD-10-CM

## 2023-03-01 DIAGNOSIS — M5442 Lumbago with sciatica, left side: Secondary | ICD-10-CM | POA: Diagnosis not present

## 2023-03-01 DIAGNOSIS — Z0000 Encounter for general adult medical examination without abnormal findings: Secondary | ICD-10-CM

## 2023-03-01 DIAGNOSIS — E785 Hyperlipidemia, unspecified: Secondary | ICD-10-CM | POA: Diagnosis not present

## 2023-03-01 DIAGNOSIS — F411 Generalized anxiety disorder: Secondary | ICD-10-CM

## 2023-03-01 DIAGNOSIS — Z125 Encounter for screening for malignant neoplasm of prostate: Secondary | ICD-10-CM

## 2023-03-01 DIAGNOSIS — Z136 Encounter for screening for cardiovascular disorders: Secondary | ICD-10-CM | POA: Diagnosis not present

## 2023-03-01 MED ORDER — ESCITALOPRAM OXALATE 20 MG PO TABS: 20.0000 mg | ORAL_TABLET | Freq: Every day | ORAL | 3 refills | Status: DC

## 2023-03-01 MED ORDER — FLUTICASONE PROPIONATE 50 MCG/ACT NA SUSP: 1.0000 | Freq: Two times a day (BID) | NASAL | 6 refills | Status: AC | PRN

## 2023-03-01 MED ORDER — DICLOFENAC SODIUM 1 % EX GEL: 2.0000 g | Freq: Four times a day (QID) | CUTANEOUS | 3 refills | Status: DC

## 2023-03-01 NOTE — Progress Notes (Signed)
BP (!) 151/64   Temp 98 F (36.7 C)   Ht 5\' 4"  (1.626 m)   Wt 142 lb (64.4 kg)   SpO2 97%   BMI 24.37 kg/m    Subjective:   Patient ID: Craig Baird, male    DOB: 03-10-1934, 89 y.o.   MRN: [id number]  HPI: Craig Baird is a 89 y.o. male presenting on 03/01/2023 for Medical Management of Chronic Issues (CPE)   HPI Physical exam Patient denies any chest pain, shortness of breath, headaches or vision issues, abdominal complaints, diarrhea, nausea, vomiting, or joint issues.   Hyperlipidemia Patient is coming in for recheck of his hyperlipidemia. The patient is currently taking no medicine, stopped his Crestor and he cannot remember why he stopped it.. They deny any issues with myalgias or history of liver damage from it. They deny any focal numbness or weakness or chest pain.   Anxiety recheck Patient is coming in today for anxiety recheck.  Currently takes Lexapro.  He feels like his anxiety medicine is helping him and he feels like he does well with it.  He still gets anxious some but he says it is mostly under control and he is happy with his medicine.    03/01/2023    9:37 AM 12/07/2022    9:15 AM 12/07/2022    9:14 AM 05/31/2022   10:57 AM 05/31/2022   10:56 AM  Depression screen PHQ 2/9  Decreased Interest 1  1 1  0  Down, Depressed, Hopeless 0  0 0 0  PHQ - 2 Score 1  1 1  0  Altered sleeping 1 1  1    Tired, decreased energy 1 1  1    Change in appetite 0 0  0   Feeling bad or failure about yourself  0 0  0   Trouble concentrating 0 0  0   Moving slowly or fidgety/restless 0 0  0   Suicidal thoughts 0 0  0   PHQ-9 Score 3   3   Difficult doing work/chores Not difficult at all Not difficult at all  Not difficult at all      Relevant past medical, surgical, family and social history reviewed and updated as indicated. Interim medical history since our last visit reviewed. Allergies and medications reviewed and updated.  Review of Systems  Constitutional:   Negative for chills and fever.  HENT:  Negative for ear pain and tinnitus.   Eyes:  Negative for pain.  Respiratory:  Negative for cough, shortness of breath and wheezing.   Cardiovascular:  Negative for chest pain, palpitations and leg swelling.  Gastrointestinal:  Negative for abdominal pain, blood in stool, constipation and diarrhea.  Genitourinary:  Negative for dysuria and hematuria.  Musculoskeletal:  Negative for back pain and myalgias.  Skin:  Negative for rash.  Neurological:  Negative for dizziness, weakness and headaches.  Psychiatric/Behavioral:  Negative for suicidal ideas.     Per HPI unless specifically indicated above   Allergies as of 03/01/2023       Reactions   Sulfa Antibiotics Rash        Medication List        Accurate as of March 01, 2023 10:18 AM. If you have any questions, ask your nurse or doctor.          STOP taking these medications    diclofenac Sodium 1 % Gel Commonly known as: Voltaren Stopped by: Elige Radon Ravyn Nikkel   loratadine 10 MG tablet Commonly known  as: CLARITIN Stopped by: Elige Radon Ori Kreiter   rosuvastatin 5 MG tablet Commonly known as: Crestor Stopped by: Elige Radon Damaris Abeln       TAKE these medications    escitalopram 20 MG tablet Commonly known as: Lexapro Take 1 tablet (20 mg total) by mouth daily.   fluticasone 50 MCG/ACT nasal spray Commonly known as: FLONASE Place 1 spray into both nostrils 2 (two) times daily as needed for allergies or rhinitis.         Objective:   BP (!) 151/64   Temp 98 F (36.7 C)   Ht 5\' 4"  (1.626 m)   Wt 142 lb (64.4 kg)   SpO2 97%   BMI 24.37 kg/m   Wt Readings from Last 3 Encounters:  03/01/23 142 lb (64.4 kg)  01/03/23 144 lb (65.3 kg)  12/07/22 140 lb (63.5 kg)    Physical Exam Vitals reviewed.  Constitutional:      General: He is not in acute distress.    Appearance: He is well-developed. He is not diaphoretic.  HENT:     Right Ear: External ear normal.      Left Ear: External ear normal.     Nose: Nose normal.     Mouth/Throat:     Pharynx: No oropharyngeal exudate.  Eyes:     General: No scleral icterus.    Conjunctiva/sclera: Conjunctivae normal.  Neck:     Thyroid: No thyromegaly.  Cardiovascular:     Rate and Rhythm: Normal rate and regular rhythm.     Heart sounds: Normal heart sounds. No murmur heard. Pulmonary:     Effort: Pulmonary effort is normal. No respiratory distress.     Breath sounds: Normal breath sounds. No wheezing.  Abdominal:     General: Bowel sounds are normal. There is no distension.     Palpations: Abdomen is soft.     Tenderness: There is no abdominal tenderness. There is no guarding or rebound.  Musculoskeletal:        General: Normal range of motion.     Cervical back: Neck supple.  Lymphadenopathy:     Cervical: No cervical adenopathy.  Skin:    General: Skin is warm and dry.     Findings: No rash.  Neurological:     Mental Status: He is alert and oriented to person, place, and time.     Coordination: Coordination normal.  Psychiatric:        Behavior: Behavior normal.       Assessment & Plan:   Problem List Items Addressed This Visit       Respiratory   Allergic rhinitis     Other   GAD (generalized anxiety disorder)   Hyperlipidemia   Other Visit Diagnoses       Physical exam    -  Primary   Relevant Orders   CBC with Differential/Platelet   CMP14+EGFR   Lipid panel   PSA, total and free     Prostate cancer screening       Relevant Orders   PSA, total and free     Chronic midline low back pain with bilateral sciatica         Blood pressure recheck came back good at 135/62 so we will continue to monitor.  Follow up plan: Return in about 1 year (around 02/29/2024), or if symptoms worsen or fail to improve, for Physical exam hyperlipidemia recheck.  Counseling provided for all of the vaccine components Orders Placed This Encounter  Procedures  CBC with  Differential/Platelet   CMP14+EGFR   Lipid panel   PSA, total and free    Arville Care, MD [facility] 03/01/2023, 10:18 AM

## 2023-03-02 LAB — CMP14+EGFR
ALT: 15 [IU]/L (ref 0–44)
AST: 19 [IU]/L (ref 0–40)
Albumin: 4.1 g/dL (ref 3.7–4.7)
Alkaline Phosphatase: 110 [IU]/L (ref 44–121)
BUN/Creatinine Ratio: 13 (ref 10–24)
BUN: 12 mg/dL (ref 8–27)
Bilirubin Total: 0.4 mg/dL (ref 0.0–1.2)
CO2: 25 mmol/L (ref 20–29)
Calcium: 8.7 mg/dL (ref 8.6–10.2)
Chloride: 101 mmol/L (ref 96–106)
Creatinine, Ser: 0.94 mg/dL (ref 0.76–1.27)
Globulin, Total: 2.5 g/dL (ref 1.5–4.5)
Glucose: 93 mg/dL (ref 70–99)
Potassium: 4.3 mmol/L (ref 3.5–5.2)
Sodium: 141 mmol/L (ref 134–144)
Total Protein: 6.6 g/dL (ref 6.0–8.5)
eGFR: 78 mL/min/{1.73_m2} (ref >59)

## 2023-03-02 LAB — LIPID PANEL
Chol/HDL Ratio: 3.7 {ratio} (ref 0.0–5.0)
Cholesterol, Total: 207 mg/dL — ABNORMAL HIGH (ref 100–199)
HDL: 56 mg/dL (ref >39)
LDL Chol Calc (NIH): 137 mg/dL — ABNORMAL HIGH (ref 0–99)
Triglycerides: 80 mg/dL (ref 0–149)
VLDL Cholesterol Cal: 14 mg/dL (ref 5–40)

## 2023-03-02 LAB — PSA, TOTAL AND FREE
PSA, Free Pct: 21.4 %
PSA, Free: 0.45 ng/mL
Prostate Specific Ag, Serum: 2.1 ng/mL (ref 0.0–4.0)

## 2023-03-02 LAB — CBC WITH DIFFERENTIAL/PLATELET
Basophils Absolute: 0.1 10*3/uL (ref 0.0–0.2)
Basos: 1 %
EOS (ABSOLUTE): 0.3 10*3/uL (ref 0.0–0.4)
Eos: 5 %
Hematocrit: 43.3 % (ref 37.5–51.0)
Hemoglobin: 14.8 g/dL (ref 13.0–17.7)
Immature Grans (Abs): 0.1 10*3/uL (ref 0.0–0.1)
Immature Granulocytes: 1 %
Lymphocytes Absolute: 1.8 10*3/uL (ref 0.7–3.1)
Lymphs: 28 %
MCH: 33 pg (ref 26.6–33.0)
MCHC: 34.2 g/dL (ref 31.5–35.7)
MCV: 97 fL (ref 79–97)
Monocytes Absolute: 0.6 10*3/uL (ref 0.1–0.9)
Monocytes: 9 %
Neutrophils Absolute: 3.7 10*3/uL (ref 1.4–7.0)
Neutrophils: 56 %
Platelets: 261 10*3/uL (ref 150–450)
RBC: 4.48 x10E6/uL (ref 4.14–5.80)
RDW: 12.5 % (ref 11.6–15.4)
WBC: 6.5 10*3/uL (ref 3.4–10.8)

## 2023-03-08 ENCOUNTER — Telehealth: Payer: Self-pay | Admitting: Family Medicine

## 2023-03-08 NOTE — Telephone Encounter (Signed)
Copied from CRM [id number]. Topic: Clinical - Lab/Test Results >> Mar 08, 2023  3:33 PM Gaetano Hawthorne wrote: Reason for CRM: Patient is calling to discuss/receive his most recent lab results. Please call him at [phone]

## 2023-03-09 NOTE — Telephone Encounter (Signed)
Contacted patient and reviewed results. Advised that provider had not viewed results yet and he would receive another cal once that was done. Patient verbalized understanding

## 2023-03-20 ENCOUNTER — Telehealth: Payer: Self-pay

## 2023-03-20 NOTE — Telephone Encounter (Signed)
 Copied from CRM [id number]. Topic: Clinical - Lab/Test Results >> Mar 20, 2023  9:38 AM Ivette P wrote: Reason for CRM: Pt called in returning a missed call to go over his  results. [phone]

## 2023-03-20 NOTE — Telephone Encounter (Signed)
 Refer to labs called patient left message

## 2023-03-22 DIAGNOSIS — H40033 Anatomical narrow angle, bilateral: Secondary | ICD-10-CM | POA: Diagnosis not present

## 2023-03-22 DIAGNOSIS — H2513 Age-related nuclear cataract, bilateral: Secondary | ICD-10-CM | POA: Diagnosis not present

## 2023-03-29 ENCOUNTER — Ambulatory Visit: Payer: Medicare Other

## 2023-03-29 VITALS — Ht 64.0 in | Wt 142.0 lb

## 2023-03-29 DIAGNOSIS — Z0000 Encounter for general adult medical examination without abnormal findings: Secondary | ICD-10-CM | POA: Diagnosis not present

## 2023-03-29 NOTE — Progress Notes (Signed)
Subjective:   Craig Baird is a 89 y.o. male who presents for Medicare Annual/Subsequent preventive examination.  Visit Complete: Virtual I connected with  Craig Baird on 03/29/23 by a audio enabled telemedicine application and verified that I am speaking with the correct person using two identifiers.  Patient Location: Home  Provider Location: Home Office  This patient declined Interactive audio and video telecommunications. Therefore the visit was completed with audio only.  I discussed the limitations of evaluation and management by telemedicine. The patient expressed understanding and agreed to proceed.  Vital Signs: Because this visit was a virtual/telehealth visit, some criteria may be missing or patient reported. Any vitals not documented were not able to be obtained and vitals that have been documented are patient reported.  Cardiac Risk Factors include: advanced age (>20men, >74 women);male gender;dyslipidemia     Objective:    Today's Vitals   03/29/23 1042  Weight: 142 lb (64.4 kg)  Height: 5\' 4"  (1.626 m)   Body mass index is 24.37 kg/m.     03/29/2023   10:51 AM 03/27/2022    3:02 PM 03/01/2021    1:18 PM 03/01/2021   10:43 AM 11/29/2017   10:04 AM 11/28/2016    3:22 PM 10/27/2016    7:42 PM  Advanced Directives  Does Patient Have a Medical Advance Directive? No Yes No No No No No  Type of Furniture conservator/restorer;Living will       Does patient want to make changes to medical advance directive?      Yes (MAU/Ambulatory/Procedural Areas - Information given)   Copy of Healthcare Power of Attorney in Chart?  No - copy requested    No - copy requested   Would patient like information on creating a medical advance directive? Yes (MAU/Ambulatory/Procedural Areas - Information given)  No - Patient declined No - Patient declined Yes (MAU/Ambulatory/Procedural Areas - Information given) Yes (MAU/Ambulatory/Procedural Areas - Information  given)     Current Medications (verified) Outpatient Encounter Medications as of 03/29/2023  Medication Sig   diclofenac Sodium (VOLTAREN) 1 % GEL Apply 2 g topically 4 (four) times daily.   escitalopram (LEXAPRO) 20 MG tablet Take 1 tablet (20 mg total) by mouth daily.   fluticasone (FLONASE) 50 MCG/ACT nasal spray Place 1 spray into both nostrils 2 (two) times daily as needed for allergies or rhinitis.   No facility-administered encounter medications on file as of 03/29/2023.    Allergies (verified) Sulfa antibiotics   History: Past Medical History:  Diagnosis Date   Allergy    Anxiety    Cataract    Diverticulosis    Hernia    L inguinal hernia   Hiatal hernia    Hyperlipidemia    SCCA (squamous cell carcinoma) of skin 09/22/2019   Right Forearem-posterior (well diff) (treatment after biopsy)   Sinusitis    Past Surgical History:  Procedure Laterality Date   COLONOSCOPY N/A 09/15/2016   Dr. Jena Gauss: Diverticulosis, several polyps removed, couple of tubular adenomas.  No future surveillance colonoscopies recommended given age.   ESOPHAGOGASTRODUODENOSCOPY N/A 09/15/2016   Dr. Jena Gauss: small hiatal hernia.   THYROID CYST EXCISION  1973   TONSILLECTOMY     Family History  Problem Relation Age of Onset   Hypertension Mother    Stroke Mother    Parkinson's disease Mother    Hip fracture Mother    Hypertension Brother 73   Heart disease Brother    Birth defects Father  spine   Early death Sister    Early death Sister    Colon cancer Neg Hx    Gastric cancer Neg Hx    Esophageal cancer Neg Hx    Social History   Socioeconomic History   Marital status: Married    Spouse name: Thelma    Number of children: 2   Years of education: Not on file   Highest education level: 7th grade  Occupational History   Not on file  Tobacco Use   Smoking status: Never   Smokeless tobacco: Never  Vaping Use   Vaping status: Never Used  Substance and Sexual Activity   Alcohol  use: No   Drug use: No   Sexual activity: Yes  Other Topics Concern   Not on file  Social History Narrative   Retired, lives with wife [place], two grown children. Enjoys working around the house.    Social Drivers of Corporate investment banker Strain: Low Risk  (03/29/2023)   Overall Financial Resource Strain (CARDIA)    Difficulty of Paying Living Expenses: Not hard at all  Food Insecurity: No Food Insecurity (03/29/2023)   Hunger Vital Sign    Worried About Running Out of Food in the Last Year: Never true    Ran Out of Food in the Last Year: Never true  Transportation Needs: No Transportation Needs (03/29/2023)   PRAPARE - Administrator, Civil Service (Medical): No    Lack of Transportation (Non-Medical): No  Physical Activity: Insufficiently Active (03/27/2022)   Exercise Vital Sign    Days of Exercise per Week: 3 days    Minutes of Exercise per Session: 30 min  Stress: No Stress Concern Present (03/29/2023)   [employer] of Occupational Health - Occupational Stress Questionnaire    Feeling of Stress : Not at all  Social Connections: Moderately Integrated (03/29/2023)   Social Connection and Isolation Panel [NHANES]    Frequency of Communication with Friends and Family: More than three times a week    Frequency of Social Gatherings with Friends and Family: Three times a week    Attends Religious Services: More than 4 times per year    Active Member of Clubs or Organizations: No    Attends Banker Meetings: Never    Marital Status: Married    Tobacco Counseling Counseling given: Not Answered   Clinical Intake:  Pre-visit preparation completed: Yes  Pain : No/denies pain     Diabetes: No  How often do you need to have someone help you when you read instructions, pamphlets, or other written materials from your doctor or pharmacy?: 1 - Never  Interpreter Needed?: No  Information entered by :: Kandis Fantasia LPN   Activities of Daily  Living    03/29/2023   10:45 AM  In your present state of health, do you have any difficulty performing the following activities:  Hearing? 0  Vision? 0  Difficulty concentrating or making decisions? 1  Walking or climbing stairs? 0  Dressing or bathing? 0  Doing errands, shopping? 0  Preparing Food and eating ? N  Using the Toilet? N  In the past six months, have you accidently leaked urine? N  Do you have problems with loss of bowel control? N  Managing your Medications? N  Managing your Finances? N  Housekeeping or managing your Housekeeping? N    Patient Care Team: Dettinger, Elige Radon, MD as PCP - General (Family Medicine) Janalyn Harder, MD (Inactive) as  Consulting Physician (Dermatology) Jena Gauss Gerrit Friends, MD as Consulting Physician (Gastroenterology) [facility], [facility] Nita Sells, MD (Dermatology)  Indicate any recent Medical Services you may have received from other than [facility] providers in the past year (date may be approximate).     Assessment:   This is a routine wellness examination for Akeen.  Hearing/Vision screen Hearing Screening - Comments:: Denies hearing difficulties   Vision Screening - Comments:: Wears rx glasses - up to date with routine eye exams with [facility]     Goals Addressed             This Visit's Progress    COMPLETED: Patient Stated       03/01/2021 AWV Goal: Keep All Scheduled Appointments  Over the next year, patient will attend all scheduled appointments with their PCP and any specialists that they see.       Depression Screen    03/29/2023   10:47 AM 03/01/2023    9:37 AM 12/07/2022    9:14 AM 05/31/2022   10:57 AM 05/31/2022   10:56 AM 03/27/2022    3:01 PM 02/24/2022   10:04 AM  PHQ 2/9 Scores  PHQ - 2 Score 1 1 1 1  0 0 0  PHQ- 9 Score 3 3  3   0     Fall Risk    03/29/2023   10:52 AM 03/01/2023    9:37 AM 12/07/2022    9:14 AM 05/31/2022   10:56 AM 03/27/2022    3:00 PM  Fall Risk    Falls in the past year? 0 0 0 1 0  Number falls in past yr: 0   0 0  Injury with Fall? 0   0 0  Risk for fall due to : No Fall Risks   History of fall(s) No Fall Risks  Follow up Falls prevention discussed;Education provided;Falls evaluation completed   Falls evaluation completed Falls prevention discussed    MEDICARE RISK AT HOME: Medicare Risk at Home Any stairs in or around the home?: No If so, are there any without handrails?: No Home free of loose throw rugs in walkways, pet beds, electrical cords, etc?: Yes Adequate lighting in your home to reduce risk of falls?: Yes Life alert?: No Use of a cane, walker or w/c?: No Grab bars in the bathroom?: Yes Shower chair or bench in shower?: No Elevated toilet seat or a handicapped toilet?: Yes  TIMED UP AND GO:  Was the test performed?  No    Cognitive Function:    11/29/2017   10:11 AM 11/28/2016    3:28 PM 11/23/2015    2:26 PM 07/08/2014    3:52 PM 07/08/2014    3:47 PM  MMSE - Mini Mental State Exam  Orientation to time 5 5 5  5   Orientation to Place 5 5 5  5   Registration 3 3 3  3   Attention/ Calculation 5 4 5   0  Recall 2 3 2  3   Language- name 2 objects 2 2 2  2   Language- repeat 1 1 1   0  Language- follow 3 step command 3 3 3  3   Language- read & follow direction 1 1 1  1   Write a sentence 1 1 1 1    Copy design 1 0 1 1   Total score 29 28 29           03/29/2023   10:52 AM 03/27/2022    3:03 PM 03/01/2021   10:46 AM  6CIT  Screen  What Year? 0 points 0 points 0 points  What month? 0 points 0 points 0 points  What time? 0 points 0 points 0 points  Count back from 20 0 points 0 points 0 points  Months in reverse 2 points 0 points 0 points  Repeat phrase 0 points 0 points 10 points  Total Score 2 points 0 points 10 points    Immunizations Immunization History  Administered Date(s) Administered   Influenza Split 11/21/2012, 11/16/2019   Influenza, High Dose Seasonal PF 11/13/2013, 11/23/2017, 11/12/2018    Influenza,inj,Quad PF,6+ Mos 12/09/2015   Influenza-Unspecified 11/11/2020, 11/19/2021   Moderna Sars-Covid-2 Vaccination 05/30/2019, 07/01/2019, 02/12/2020   PNEUMOCOCCAL CONJUGATE-20 11/01/2021   Pneumococcal Conjugate-13 07/08/2014   Pneumococcal Polysaccharide-23 08/21/2007, 02/11/2011   Pneumococcal-Unspecified 11/29/2014   Tdap 11/23/2010, 05/16/2021   Zoster Recombinant(Shingrix) 03/17/2021, 05/16/2021    TDAP status: Up to date  Flu Vaccine status: Up to date  Pneumococcal vaccine status: Up to date  Covid-19 vaccine status: Information provided on how to obtain vaccines.   Qualifies for Shingles Vaccine? Yes   Zostavax completed No   Shingrix Completed?: Yes  Screening Tests Health Maintenance  Topic Date Due   COVID-19 Vaccine (4 - 2024-25 season) 11/12/2022   INFLUENZA VACCINE  06/11/2023 (Originally 10/12/2022)   Medicare Annual Wellness (AWV)  03/28/2024   DTaP/Tdap/Td (3 - Td or Tdap) 05/17/2031   Pneumonia Vaccine 29+ Years old  Completed   Zoster Vaccines- Shingrix  Completed   HPV VACCINES  Aged Out    Health Maintenance  Health Maintenance Due  Topic Date Due   COVID-19 Vaccine (4 - 2024-25 season) 11/12/2022    Colorectal cancer screening: No longer required.   Lung Cancer Screening: (Low Dose CT Chest recommended if Age 69-80 years, 20 pack-year currently smoking OR have quit w/in 15years.) does not qualify.   Lung Cancer Screening Referral: n/a  Additional Screening:  Hepatitis C Screening: does not qualify  Vision Screening: Recommended annual ophthalmology exams for early detection of glaucoma and other disorders of the eye. Is the patient up to date with their annual eye exam?  Yes  Who is the provider or what is the name of the office in which the patient attends annual eye exams? [facility]  If pt is not established with a provider, would they like to be referred to a provider to establish care? No .   Dental Screening:  Recommended annual dental exams for proper oral hygiene  Community Resource Referral / Chronic Care Management: CRR required this visit?  No   CCM required this visit?  No     Plan:     I have personally reviewed and noted the following in the patient's chart:   Medical and social history Use of alcohol, tobacco or illicit drugs  Current medications and supplements including opioid prescriptions. Patient is not currently taking opioid prescriptions. Functional ability and status Nutritional status Physical activity Advanced directives List of other physicians Hospitalizations, surgeries, and ER visits in previous 12 months Vitals Screenings to include cognitive, depression, and falls Referrals and appointments  In addition, I have reviewed and discussed with patient certain preventive protocols, quality metrics, and best practice recommendations. A written personalized care plan for preventive services as well as general preventive health recommendations were provided to patient.     Kandis Fantasia [place], [place]   06/19/8117   After Visit Summary: (Mail) Due to this being a telephonic visit, the after visit summary with patients personalized plan was  offered to patient via mail   Nurse Notes: No concerns at this time

## 2023-03-29 NOTE — Patient Instructions (Signed)
Mr. Nankervis , Thank you for taking time to come for your Medicare Wellness Visit. I appreciate your ongoing commitment to your health goals. Please review the following plan we discussed and let me know if I can assist you in the future.   Referrals/Orders/Follow-Ups/Clinician Recommendations: Aim for 30 minutes of exercise or brisk walking, 6-8 glasses of water, and 5 servings of fruits and vegetables each day.  This is a list of the screening recommended for you and due dates:  Health Maintenance  Topic Date Due   COVID-19 Vaccine (4 - 2024-25 season) 11/12/2022   Flu Shot  06/11/2023*   Medicare Annual Wellness Visit  03/28/2024   DTaP/Tdap/Td vaccine (3 - Td or Tdap) 05/17/2031   Pneumonia Vaccine  Completed   Zoster (Shingles) Vaccine  Completed   HPV Vaccine  Aged Out  *Topic was postponed. The date shown is not the original due date.    Advanced directives: (ACP Link)Information on Advanced Care Planning can be found at [facility] [place] Advance Health Care Directives Advance Health Care Directives ([email or web address]   Next Medicare Annual Wellness Visit scheduled for next year: Yes

## 2023-04-10 ENCOUNTER — Encounter: Payer: Self-pay | Admitting: Family

## 2023-04-10 ENCOUNTER — Ambulatory Visit (INDEPENDENT_AMBULATORY_CARE_PROVIDER_SITE_OTHER): Payer: Medicare Other | Admitting: Family

## 2023-04-10 VITALS — BP 138/69 | HR 91 | Temp 99.4°F | Ht 65.0 in | Wt 143.8 lb

## 2023-04-10 DIAGNOSIS — J0110 Acute frontal sinusitis, unspecified: Secondary | ICD-10-CM

## 2023-04-10 MED ORDER — AMOXICILLIN-POT CLAVULANATE 875-125 MG PO TABS: 1.0000 | ORAL_TABLET | Freq: Two times a day (BID) | ORAL | 0 refills | Status: DC

## 2023-04-10 NOTE — Patient Instructions (Signed)

## 2023-04-10 NOTE — Progress Notes (Signed)
Subjective:    Patient ID: Craig Baird, male    DOB: 1933-08-28, 89 y.o.   MRN: [id number]  Chief Complaint  Patient presents with  . Nasal Congestion  . Nausea    PT STATES HIS NOSE RUNS NON STOP, HEAD FEELS STUFFY MAKING HIM FEEL DIZZY   PT presents to the office today with sinus and nasal congestion for over a week that has worsen.  Had a negative COVID test at home.  Sinusitis This is a new problem. The current episode started 1 to 4 weeks ago. The problem has been gradually worsening since onset. Maximum temperature: 71F. His pain is at a severity of 5/10. Associated symptoms include congestion, coughing, ear pain, sinus pressure, sneezing and a sore throat. Pertinent negatives include no headaches or shortness of breath. Past treatments include oral decongestants and acetaminophen. The treatment provided mild relief.      Review of Systems  HENT:  Positive for congestion, ear pain, sinus pressure, sneezing and sore throat.   Respiratory:  Positive for cough. Negative for shortness of breath.   Neurological:  Negative for headaches.  All other systems reviewed and are negative.   Social History   Socioeconomic History  . Marital status: Married    Spouse name: Craig Baird   . Number of children: 2  . Years of education: Not on file  . Highest education level: 7th grade  Occupational History  . Not on file  Tobacco Use  . Smoking status: Never  . Smokeless tobacco: Never  Vaping Use  . Vaping status: Never Used  Substance and Sexual Activity  . Alcohol use: No  . Drug use: No  . Sexual activity: Yes  Other Topics Concern  . Not on file  Social History Narrative   Retired, lives with wife Craig Baird, two grown children. Enjoys working around the house.    Social Drivers of Corporate investment banker Strain: Low Risk  (03/29/2023)   Overall Financial Resource Strain (CARDIA)   . Difficulty of Paying Living Expenses: Not hard at all  Food Insecurity: No Food  Insecurity (03/29/2023)   Hunger Vital Sign   . Worried About Programme researcher, broadcasting/film/video in the Last Year: Never true   . Ran Out of Food in the Last Year: Never true  Transportation Needs: No Transportation Needs (03/29/2023)   PRAPARE - Transportation   . Lack of Transportation (Medical): No   . Lack of Transportation (Non-Medical): No  Physical Activity: Insufficiently Active (03/27/2022)   Exercise Vital Sign   . Days of Exercise per Week: 3 days   . Minutes of Exercise per Session: 30 min  Stress: No Stress Concern Present (03/29/2023)   [employer] of Occupational Health - Occupational Stress Questionnaire   . Feeling of Stress : Not at all  Social Connections: Moderately Integrated (03/29/2023)   Social Connection and Isolation Panel [NHANES]   . Frequency of Communication with Friends and Family: More than three times a week   . Frequency of Social Gatherings with Friends and Family: Three times a week   . Attends Religious Services: More than 4 times per year   . Active Member of Clubs or Organizations: No   . Attends Banker Meetings: Never   . Marital Status: Married   Family History  Problem Relation Age of Onset  . Hypertension Mother   . Stroke Mother   . Parkinson's disease Mother   . Hip fracture Mother   . Hypertension Brother  55  . Heart disease Brother   . Birth defects Father        spine  . Early death Sister   . Early death Sister   . Colon cancer Neg Hx   . Gastric cancer Neg Hx   . Esophageal cancer Neg Hx         Objective:   Physical Exam Vitals reviewed.  Constitutional:      General: He is not in acute distress.    Appearance: He is well-developed.  HENT:     Head: Normocephalic.     Right Ear: Tympanic membrane and external ear normal.     Left Ear: Tympanic membrane and external ear normal.     Nose:     Right Sinus: Frontal sinus tenderness present.     Left Sinus: Frontal sinus tenderness present.     Mouth/Throat:      Pharynx: Pharyngeal swelling and postnasal drip present.  Eyes:     General:        Right eye: No discharge.        Left eye: No discharge.     Pupils: Pupils are equal, round, and reactive to light.  Neck:     Thyroid: No thyromegaly.  Cardiovascular:     Rate and Rhythm: Normal rate and regular rhythm.     Heart sounds: Normal heart sounds. No murmur heard. Pulmonary:     Effort: Pulmonary effort is normal. No respiratory distress.     Breath sounds: Normal breath sounds. No wheezing.  Abdominal:     General: Bowel sounds are normal. There is no distension.     Palpations: Abdomen is soft.     Tenderness: There is no abdominal tenderness.  Musculoskeletal:        General: No tenderness. Normal range of motion.     Cervical back: Normal range of motion and neck supple.  Skin:    General: Skin is warm and dry.     Findings: No erythema or rash.  Neurological:     Mental Status: He is alert and oriented to person, place, and time.     Cranial Nerves: No cranial nerve deficit.     Deep Tendon Reflexes: Reflexes are normal and symmetric.  Psychiatric:        Behavior: Behavior normal.        Thought Content: Thought content normal.        Judgment: Judgment normal.      BP 138/69   Pulse 91   Temp 99.4 F (37.4 C)   Ht 5\' 5"  (1.651 m)   Wt 143 lb 12.8 oz (65.2 kg)   SpO2 96%   BMI 23.93 kg/m      Assessment & Plan:  Craig Baird comes in today with chief complaint of Nasal Congestion and Nausea (PT STATES HIS NOSE RUNS NON STOP, HEAD FEELS STUFFY MAKING HIM FEEL DIZZY)   Diagnosis and orders addressed:  1. Acute non-recurrent frontal sinusitis (Primary) - Take meds as prescribed - Use a cool mist humidifier  -Use saline nose sprays frequently -Force fluids -For any cough or congestion  Use plain Mucinex- regular strength or max strength is fine -For fever or aces or pains- take tylenol or ibuprofen. -Throat lozenges if help -Follow up if symptoms worsen  or do not improve  - amoxicillin-clavulanate (AUGMENTIN) 875-125 MG tablet; Take 1 tablet by mouth 2 (two) times daily.  Dispense: 14 tablet; Refill: 0     Jannifer Rodney, FNP

## 2023-04-11 ENCOUNTER — Ambulatory Visit: Payer: Medicare Other | Admitting: Orthopedic Surgery

## 2023-04-23 DIAGNOSIS — Z08 Encounter for follow-up examination after completed treatment for malignant neoplasm: Secondary | ICD-10-CM | POA: Diagnosis not present

## 2023-04-23 DIAGNOSIS — Z85828 Personal history of other malignant neoplasm of skin: Secondary | ICD-10-CM | POA: Diagnosis not present

## 2023-04-23 DIAGNOSIS — X32XXXD Exposure to sunlight, subsequent encounter: Secondary | ICD-10-CM | POA: Diagnosis not present

## 2023-04-23 DIAGNOSIS — L570 Actinic keratosis: Secondary | ICD-10-CM | POA: Diagnosis not present

## 2023-04-30 ENCOUNTER — Encounter: Payer: Self-pay | Admitting: Orthopedic Surgery

## 2023-04-30 ENCOUNTER — Ambulatory Visit: Payer: Medicare Other | Admitting: Orthopedic Surgery

## 2023-04-30 ENCOUNTER — Telehealth: Payer: Self-pay

## 2023-04-30 DIAGNOSIS — M170 Bilateral primary osteoarthritis of knee: Secondary | ICD-10-CM | POA: Diagnosis not present

## 2023-04-30 MED ORDER — LIDOCAINE HCL 1 % IJ SOLN
5.0000 mL | INTRAMUSCULAR | Status: AC | PRN
  Administered 2023-04-30: 5 mL

## 2023-04-30 MED ORDER — METHYLPREDNISOLONE ACETATE 40 MG/ML IJ SUSP
40.0000 mg | INTRAMUSCULAR | Status: AC | PRN
  Administered 2023-04-30: 40 mg via INTRA_ARTICULAR

## 2023-04-30 MED ORDER — BUPIVACAINE HCL 0.25 % IJ SOLN
4.0000 mL | INTRAMUSCULAR | Status: AC | PRN
  Administered 2023-04-30: 4 mL via INTRA_ARTICULAR

## 2023-04-30 NOTE — Progress Notes (Signed)
Office Visit Note   Patient: Craig Baird           Date of Birth: November 23, 1933           MRN: [id number] Visit Date: 04/30/2023 Requested by: Dettinger, Elige Radon, MD [address] [place],  [place] [address] PCP: Dettinger, Elige Radon, MD  Subjective: Chief Complaint  Patient presents with   Right Knee - Pain   Left Knee - Pain    HPI: Craig Baird is a 89 y.o. male who presents to the office reporting bilateral knee pain.  Patient had gel injections in October without much relief.  Cortisone injections in December helped some.  Patient states the pain is getting worse left is worse than right.  Interested in other options outside of surgery.  Does use topical creams..                ROS: All systems reviewed are negative as they relate to the chief complaint within the history of present illness.  Patient denies fevers or chills.  Assessment & Plan: Visit Diagnoses:  1. Primary osteoarthritis of both knees     Plan: Impression is end-stage arthritis particular on that left-hand side.  Mild effusion present in both knees.  Plan is bilateral knee aspiration and injection with cortisone today.  We had about 10 cc out.  Please approve gel for 07/09/2023 in both knees.  Patient states that helped him enough that he would like to try to repeat.  Follow-up at that time This patient is diagnosed with osteoarthritis of the knee(s).    Radiographs show evidence of joint space narrowing, osteophytes, subchondral sclerosis and/or subchondral cysts.  This patient has knee pain which interferes with functional and activities of daily living.    This patient has experienced inadequate response, adverse effects and/or intolerance with conservative treatments such as acetaminophen, NSAIDS, topical creams, physical therapy or regular exercise, knee bracing and/or weight loss.   This patient has experienced inadequate response or has a contraindication to intra articular steroid injections for at  least 3 months.   This patient is not scheduled to have a total knee replacement within 6 months of starting treatment with viscosupplementation.   Follow-Up Instructions: No follow-ups on file.   Orders:  No orders of the defined types were placed in this encounter.  No orders of the defined types were placed in this encounter.     Procedures: Large Joint Inj: bilateral knee on 04/30/2023 3:03 PM Indications: diagnostic evaluation, joint swelling and pain Details: 18 G 1.5 in needle, superolateral approach  Arthrogram: No  Medications (Right): 5 mL lidocaine 1 %; 4 mL bupivacaine 0.25 %; 40 mg methylPREDNISolone acetate 40 MG/ML Medications (Left): 5 mL lidocaine 1 %; 4 mL bupivacaine 0.25 %; 40 mg methylPREDNISolone acetate 40 MG/ML Outcome: tolerated well, no immediate complications Procedure, treatment alternatives, risks and benefits explained, specific risks discussed. Consent was given by the patient. Immediately prior to procedure a time out was called to verify the correct patient, procedure, equipment, support staff and site/side marked as required. Patient was prepped and draped in the usual sterile fashion.       Clinical Data: No additional findings.  Objective: Vital Signs: There were no vitals taken for this visit.  Physical Exam:  Constitutional: Patient appears well-developed HEENT:  Head: Normocephalic Eyes:EOM are normal Neck: Normal range of motion Cardiovascular: Normal rate Pulmonary/chest: Effort normal Neurologic: Patient is alert Skin: Skin is warm Psychiatric: Patient has normal mood  and affect  Ortho Exam: Ortho exam demonstrates 3 degree flexion contracture in both knees.  Flexes easily to about 120.  Trace effusion in both knees.  Collateral crucial ligaments are stable.  Pedal pulses palpable.  No groin pain with internal/external Tatian of the leg.  Has some medial joint line tenderness on the left with mild patellofemoral crepitus in both  knees.  Extensor mechanism intact bilaterally  Specialty Comments:  No specialty comments available.  Imaging: No results found.   PMFS History: Patient Active Problem List   Diagnosis Date Noted   Chronic midline low back pain without sciatica 11/08/2021   Osteopenia 11/23/2015   GAD (generalized anxiety disorder) 10/23/2014   Insomnia 10/23/2014   Arthritis 10/23/2014   Hyperlipidemia 10/23/2014   Allergic rhinitis 08/12/2012   Past Medical History:  Diagnosis Date   Allergy    Anxiety    Cataract    Diverticulosis    Hernia    L inguinal hernia   Hiatal hernia    Hyperlipidemia    SCCA (squamous cell carcinoma) of skin 09/22/2019   Right Forearem-posterior (well diff) (treatment after biopsy)   Sinusitis     Family History  Problem Relation Age of Onset   Hypertension Mother    Stroke Mother    Parkinson's disease Mother    Hip fracture Mother    Hypertension Brother 48   Heart disease Brother    Birth defects Father        spine   Early death Sister    Early death Sister    Colon cancer Neg Hx    Gastric cancer Neg Hx    Esophageal cancer Neg Hx     Past Surgical History:  Procedure Laterality Date   COLONOSCOPY N/A 09/15/2016   Dr. Jena Gauss: Diverticulosis, several polyps removed, couple of tubular adenomas.  No future surveillance colonoscopies recommended given age.   ESOPHAGOGASTRODUODENOSCOPY N/A 09/15/2016   Dr. Jena Gauss: small hiatal hernia.   THYROID CYST EXCISION  1973   TONSILLECTOMY     Social History   Occupational History   Not on file  Tobacco Use   Smoking status: Never   Smokeless tobacco: Never  Vaping Use   Vaping status: Never Used  Substance and Sexual Activity   Alcohol use: No   Drug use: No   Sexual activity: Yes

## 2023-04-30 NOTE — Telephone Encounter (Signed)
Auth needed for bil knee gel 07/09/23

## 2023-05-04 ENCOUNTER — Ambulatory Visit: Payer: Self-pay | Admitting: Family Medicine

## 2023-05-04 DIAGNOSIS — S43401A Unspecified sprain of right shoulder joint, initial encounter: Secondary | ICD-10-CM | POA: Diagnosis not present

## 2023-05-04 DIAGNOSIS — S0990XA Unspecified injury of head, initial encounter: Secondary | ICD-10-CM | POA: Diagnosis not present

## 2023-05-04 DIAGNOSIS — S51001A Unspecified open wound of right elbow, initial encounter: Secondary | ICD-10-CM | POA: Diagnosis not present

## 2023-05-04 NOTE — Telephone Encounter (Signed)
Copied from CRM [id number]. Topic: Clinical - Red Word Triage >> May 04, 2023 10:12 AM Hector Shade B wrote: [facility] that prompted transfer to Nurse Triage: patient's spouse stated the patient fell yesterday and hurt his shoulder and arm really bad, he can move it a little but extreme pain when trying to move it.   Chief Complaint: recent fall  Symptoms: 10/10 shoulder and arm pain Frequency: ongoing since yesterday  Disposition: [] ED /[x] Urgent Care (no appt availability in office) / [] Appointment(In office/virtual)/ []  [place] [facility]/ [] Home Care/ [] Refused Recommended Disposition /[] [place] [facility]/ []  Follow-up with PCP Additional Notes: The patient's wife reported that the patient fell yesterday due to tripping on a rug at the service station.  He started experiencing pain later on in the evening in his right shoulder and arm.  He does not have full range of motion and rated the pain 10/10.  He is requesting an x-ray.  He has some bruising and scrapes.  There was no availability in his normal pcp's office and offered to schedule him with a different office with same day availability he said, "forget it, he will just go to urgent care.  Routed to pcp for awareness.  Reason for Disposition  [1] Large swelling or bruise around joint (wrist, elbow, shoulder) AND [2] can't move injured arm normally (bend or straighten completely)  Answer Assessment - Initial Assessment Questions 1. MECHANISM: "How did the injury happen?"     Tripped on a rug at the service station  2. ONSET: "When did the injury happen?" (Minutes or hours ago)      Yesterday  3. LOCATION: "Where is the injury located?" "Which arm?"     Right arm  4. APPEARANCE of INJURY: "What does the injury look like?"      bru 5. SEVERITY: "Can you use the arm normally?"      Unable to lift  6. SWELLING or BRUISING: "is there any swelling or bruising?" If Yes, ask: "How large is it? (e.g., inches, centimeters)      Red and  scraped and bruises  7. PAIN: "Is there pain?" If Yes, ask: "How bad is the pain?"    (Scale 1-10; or mild, moderate, severe)   - NONE (0): No pain.   - MILD (1-3): Doesn't interfere with normal activities.   - MODERATE (4-7): Interferes with normal activities (e.g., work or school) or awakens from sleep.   - SEVERE (8-10): Excruciating pain, unable to do any normal activities, unable to hold a cup of water.     10/10 9. OTHER SYMPTOMS: "Do you have any other symptoms?"  (e.g., numbness in hand)     No numbness  Protocols used: Arm Injury-A-AH

## 2023-05-10 ENCOUNTER — Encounter: Payer: Self-pay | Admitting: Nurse Practitioner

## 2023-05-10 ENCOUNTER — Ambulatory Visit: Payer: Medicare Other | Admitting: Nurse Practitioner

## 2023-05-10 ENCOUNTER — Ambulatory Visit: Payer: Self-pay | Admitting: Family Medicine

## 2023-05-10 VITALS — BP 118/76 | HR 83 | Temp 98.7°F | Ht 65.0 in | Wt 144.4 lb

## 2023-05-10 DIAGNOSIS — M5442 Lumbago with sciatica, left side: Secondary | ICD-10-CM

## 2023-05-10 DIAGNOSIS — M5441 Lumbago with sciatica, right side: Secondary | ICD-10-CM

## 2023-05-10 DIAGNOSIS — G8929 Other chronic pain: Secondary | ICD-10-CM

## 2023-05-10 DIAGNOSIS — M25511 Pain in right shoulder: Secondary | ICD-10-CM

## 2023-05-10 MED ORDER — METHYLPREDNISOLONE 4 MG PO TBPK
ORAL_TABLET | ORAL | 0 refills | Status: AC
Start: 2023-05-10 — End: ?

## 2023-05-10 MED ORDER — DICLOFENAC SODIUM 1 % EX GEL: 2.0000 g | Freq: Four times a day (QID) | CUTANEOUS | 3 refills | Status: AC

## 2023-05-10 NOTE — Telephone Encounter (Signed)
 Copied from CRM [id number]. Topic: Clinical - Red Word Triage >> May 10, 2023 10:38 AM Gildardo Pounds wrote: Red Word that prompted transfer to Nurse Triage: Patient fell last Friday and hurt right shoulder. cannot lay on arm at night or get any rest because he is miserable. Urgent Care in [place] and got a shot in his bottom that did not do any good. Bad bruise. Callback number is [phone].   Chief Complaint: Right shoulder pain Symptoms: Pain, nausea Frequency: 6 days Pertinent Negatives: Patient denies numbness, chest pain, difficulty breathing Disposition: [] ED /[] Urgent Care (no appt availability in office) / [x] Appointment(In office/virtual)/ []  [place] [facility]/ [] Home Care/ [] Refused Recommended Disposition /[] [place] [facility]/ []  Follow-up with PCP Additional Notes: Patient called and advised that 6 days ago patient tripped on a rug and fell.  Patient states that he went to an urgent care in [place] that day.  His head was assessed and his right shoulder.  He was told at the urgent care it would take six weeks to "get straightened out". They took X-rays and didn't see anything broken. He was not given follow up advice.  He was given a shot in his buttocks and he said that didn't help. Patient states it hurts to raise his arm up high. He states he can only bring this arm up about halfway. He said that he believes he is up to date with a tetanus shot. They didn't give him one at the urgent care and he did say he had a scrap on that arm. Patient states that the pain is bad enough to cause him to have some nausea. He wasn't given any specific follow up advice from the urgent care. He states he can still move his fingers and is not experiencing any numbness, difficulty breathing, or chest pain. Patient is given some Care Advice as per protocol and appointment was made for today 05/10/2023 with Martina Sinner NP at 11:45 am.  Patient is also advised that if anything gets worse to go to the  emergency room.  Patient verbalized understanding.   Reason for Disposition  [1] Large swelling or bruise (> 2 inches or 5 cm) AND [2] can't move injured shoulder normally (range of motion, touch top of head)  Answer Assessment - Initial Assessment Questions 1. ONSET: "When did the pain start?"     6 days ago 2. LOCATION: "Where is the pain located?"     Right shoulder 3. PAIN: "How bad is the pain?" (Scale 1-10; or mild, moderate, severe)   - MILD (1-3): doesn't interfere with normal activities   - MODERATE (4-7): interferes with normal activities (e.g., work or school) or awakens from sleep   - SEVERE (8-10): excruciating pain, unable to do any normal activities, unable to move arm at all due to pain     8 at night 4. WORK OR EXERCISE: "Has there been any recent work or exercise that involved this part of the body?"     No  fell on it 5. CAUSE: "What do you think is causing the shoulder pain?"     fall 6. OTHER SYMPTOMS: "Do you have any other symptoms?" (e.g., neck pain, swelling, rash, fever, numbness, weakness)       Nausea  Answer Assessment - Initial Assessment Questions 1. MECHANISM: "How did the injury happen?"     Fall 6 days ago 2. ONSET: "When did the injury happen?" (Minutes or hours ago)      6 days  ago 3. APPEARANCE of INJURY: "What does the injury look like?"      Swollen some but not as bad as it was.  Bruised bad per patient. 4. SEVERITY: "Can you move the shoulder normally?"      Yes but raising it up high hurts 5. SIZE: For cuts, bruises, or swelling, ask: "How large is it?" (e.g., inches or centimeters;  entire joint)      States bruised badly  6. PAIN: "Is there pain?" If Yes, ask: "How bad is the pain?"   (e.g., Scale 1-10; or mild, moderate, severe)   - MILD (1-3): doesn't interfere with normal activities   - MODERATE (4-7): interferes with normal activities (e.g., work or school) or awakens from sleep   - SEVERE (8-10): excruciating pain, unable to do any  normal activities, unable to move arm at all due to pain     8 at night 7. TETANUS: For any breaks in the skin, ask: "When was the last tetanus booster?"     "I think I am up to date" 8. OTHER SYMPTOMS: "Do you have any other symptoms?" (e.g., loss of sensation)     Nausea from the pain  Protocols used: Shoulder Pain-A-AH, Shoulder Injury-A-AH

## 2023-05-10 NOTE — Progress Notes (Signed)
 Acute Office Visit  Subjective:     Patient ID: Craig Baird, male    DOB: 09-17-1933, 89 y.o.   MRN: [id number]  Chief Complaint  Patient presents with   Shoulder Pain    Larey Seat 6 days ago having right shoulder pain, went to urgent care did xray, was normal. Gave shot to pt but did not help   Fall    Shoulder Pain   Fall   Craig Baird is a 85 yrs male presents 05/10/2023 fro an acute visit concerns for right shoulder pain. Right Shoulder Pain: The patient is an 89 year old male who presents today for an acute visit due to right shoulder pain. The patient reports that the pain began after a fall last Saturday. He was evaluated at an urgent care center, where an X-ray was performed, and it was negative for fractures or dislocations. He was also administered an IM injection for pain relief, but he reports that the injection did not alleviate his pain.  The patient describes the pain as sharp, localized to the right shoulder, and rates it as 7/10 in intensity. The pain is non-radiating and does not extend to other areas, such as the arm or neck. The pain persists at rest and is aggravated by movement, but the patient denies any numbness, tingling, or weakness in the shoulder or arm. Active Ambulatory Problems    Diagnosis Date Noted   Allergic rhinitis 08/12/2012   GAD (generalized anxiety disorder) 10/23/2014   Insomnia 10/23/2014   Arthritis 10/23/2014   Hyperlipidemia 10/23/2014   Osteopenia 11/23/2015   Chronic midline low back pain without sciatica 11/08/2021   Acute pain of right shoulder 05/10/2023   Resolved Ambulatory Problems    Diagnosis Date Noted   Anxiety state 08/12/2012   Dermatitis 04/05/2015   Preventative health care 12/16/2015   Loss of weight 08/31/2016   Nausea without vomiting 08/31/2016   Diarrhea 03/24/2020   Past Medical History:  Diagnosis Date   Allergy    Anxiety    Cataract    Diverticulosis    Hernia    Hiatal hernia    SCCA  (squamous cell carcinoma) of skin 09/22/2019   Sinusitis     ROS Negative unless indicated in HPI    Objective:    BP 118/76   Pulse 83   Temp 98.7 F (37.1 C) (Temporal)   Ht 5\' 5"  (1.651 m)   Wt 144 lb 6.4 oz (65.5 kg)   SpO2 97%   BMI 24.03 kg/m  BP Readings from Last 3 Encounters:  05/10/23 118/76  04/10/23 138/69  03/01/23 135/62   Wt Readings from Last 3 Encounters:  05/10/23 144 lb 6.4 oz (65.5 kg)  04/10/23 143 lb 12.8 oz (65.2 kg)  03/29/23 142 lb (64.4 kg)      Physical Exam Vitals and nursing note reviewed.  Constitutional:      Appearance: He is normal weight.  HENT:     Head: Normocephalic and atraumatic.     Nose: Nose normal.  Eyes:     General: No scleral icterus.    Extraocular Movements: Extraocular movements intact.     Conjunctiva/sclera: Conjunctivae normal.     Pupils: Pupils are equal, round, and reactive to light.  Skin:    General: Skin is warm and dry.  Neurological:     Mental Status: He is alert and oriented to person, place, and time. Mental status is at baseline.  Psychiatric:  Mood and Affect: Mood normal.        Behavior: Behavior normal.        Thought Content: Thought content normal.        Judgment: Judgment normal.     No results found for any visits on 05/10/23.      Assessment & Plan:  Acute pain of right shoulder -     methylPREDNISolone; Follow instructions on the box  Dispense: 21 tablet; Refill: 0  Chronic midline low back pain with bilateral sciatica -     Diclofenac Sodium; Apply 2 g topically 4 (four) times daily.  Dispense: 700 g; Refill: 3   Jermie is 89 yrs old caucasian male seen for right shoulder pain, no acute distress Right Shoulder Pain: Medrol dose pack 4 mg #21 dispense; Voltaren gel refilled; heat q15 mins PRN   The above assessment and management plan was discussed with the patient. The patient verbalized understanding of and has agreed to the management plan. Patient is aware to call  the clinic if they develop any new symptoms or if symptoms persist or worsen. Patient is aware when to return to the clinic for a follow-up visit. Patient educated on when it is appropriate to go to the emergency department.   Return if symptoms worsen or fail to improve.  Craig Baird, [facility] [address] [place], [place] [address] [phone]  Note: This document was prepared by Reubin Milan voice dictation technology and any errors that results from this process are unintentional.

## 2023-05-12 DIAGNOSIS — S43401D Unspecified sprain of right shoulder joint, subsequent encounter: Secondary | ICD-10-CM | POA: Diagnosis not present

## 2023-05-12 DIAGNOSIS — R030 Elevated blood-pressure reading, without diagnosis of hypertension: Secondary | ICD-10-CM | POA: Diagnosis not present

## 2023-05-16 DIAGNOSIS — M25511 Pain in right shoulder: Secondary | ICD-10-CM | POA: Diagnosis not present

## 2023-05-16 DIAGNOSIS — R936 Abnormal findings on diagnostic imaging of limbs: Secondary | ICD-10-CM | POA: Diagnosis not present

## 2023-05-16 DIAGNOSIS — S46011A Strain of muscle(s) and tendon(s) of the rotator cuff of right shoulder, initial encounter: Secondary | ICD-10-CM | POA: Diagnosis not present

## 2023-05-16 DIAGNOSIS — S43401D Unspecified sprain of right shoulder joint, subsequent encounter: Secondary | ICD-10-CM | POA: Diagnosis not present

## 2023-05-24 DIAGNOSIS — M25711 Osteophyte, right shoulder: Secondary | ICD-10-CM | POA: Diagnosis not present

## 2023-05-24 DIAGNOSIS — M67813 Other specified disorders of tendon, right shoulder: Secondary | ICD-10-CM | POA: Diagnosis not present

## 2023-05-24 DIAGNOSIS — M19011 Primary osteoarthritis, right shoulder: Secondary | ICD-10-CM | POA: Diagnosis not present

## 2023-05-24 NOTE — Telephone Encounter (Addendum)
 VOB submitted for Durolane, bilateral knee  Called and left a VM advising patient that he would need to reschedule his appointment after 07/09/2023 if wanting to get gel injection for bilateral knee.

## 2023-05-28 ENCOUNTER — Telehealth: Payer: Self-pay | Admitting: Family Medicine

## 2023-05-28 NOTE — Telephone Encounter (Signed)
 Copied from CRM [id number]. Topic: General - Inquiry >> May 28, 2023 11:04 AM Haroldine Laws wrote: Reason for CRM: pt wants to know if the office recommends or gives measle vaccine to adults.  [phone]

## 2023-05-28 NOTE — Telephone Encounter (Addendum)
"  If you received a measles vaccine in the 1960s, you may not need to be revaccinated. People who have documentation of receiving LIVE measles vaccine in the 1960s do not need to be revaccinated. People who were vaccinated prior to 1968 with either inactivated (killed) measles vaccine or measles vaccine of unknown type should be revaccinated with at least one dose of live attenuated measles vaccine. This recommendation is intended to protect those who may have received killed measles vaccine, which was available in [phone] and was not effective."  Left message making pt aware of the above. Advised that he needs to know which vaccine her received. If he is not sure we can draw a titer to see if he is immune.

## 2023-05-31 DIAGNOSIS — S46011A Strain of muscle(s) and tendon(s) of the rotator cuff of right shoulder, initial encounter: Secondary | ICD-10-CM | POA: Diagnosis not present

## 2023-05-31 DIAGNOSIS — R936 Abnormal findings on diagnostic imaging of limbs: Secondary | ICD-10-CM | POA: Diagnosis not present

## 2023-05-31 DIAGNOSIS — M25511 Pain in right shoulder: Secondary | ICD-10-CM | POA: Diagnosis not present

## 2023-05-31 DIAGNOSIS — S43401D Unspecified sprain of right shoulder joint, subsequent encounter: Secondary | ICD-10-CM | POA: Diagnosis not present

## 2023-06-08 ENCOUNTER — Ambulatory Visit: Attending: Orthopedic Surgery | Admitting: Physical Therapy

## 2023-06-08 ENCOUNTER — Encounter: Payer: Self-pay | Admitting: Physical Therapy

## 2023-06-08 ENCOUNTER — Other Ambulatory Visit: Payer: Self-pay

## 2023-06-08 DIAGNOSIS — M25511 Pain in right shoulder: Secondary | ICD-10-CM | POA: Diagnosis not present

## 2023-06-08 DIAGNOSIS — M62838 Other muscle spasm: Secondary | ICD-10-CM | POA: Diagnosis not present

## 2023-06-08 DIAGNOSIS — M6281 Muscle weakness (generalized): Secondary | ICD-10-CM | POA: Diagnosis not present

## 2023-06-08 NOTE — Therapy (Addendum)
 OUTPATIENT PHYSICAL THERAPY SHOULDER EVALUATION   Patient Name: Craig Baird MRN: [id number] DOB:[birth year], 89 y.o., male Today's Date: 06/08/2023  END OF SESSION:  PT End of Session - 06/08/23 1138     Visit Number 1    Number of Visits 12    Date for PT Re-Evaluation 07/20/23    PT Start Time 1103    Activity Tolerance Patient tolerated treatment well    Behavior During Therapy [facility] for tasks assessed/performed             Past Medical History:  Diagnosis Date   Allergy    Anxiety    Cataract    Diverticulosis    Hernia    L inguinal hernia   Hiatal hernia    Hyperlipidemia    SCCA (squamous cell carcinoma) of skin 09/22/2019   Right Forearem-posterior (well diff) (treatment after biopsy)   Sinusitis    Past Surgical History:  Procedure Laterality Date   COLONOSCOPY N/A 09/15/2016   Dr. Jena Gauss: Diverticulosis, several polyps removed, couple of tubular adenomas.  No future surveillance colonoscopies recommended given age.   ESOPHAGOGASTRODUODENOSCOPY N/A 09/15/2016   Dr. Jena Gauss: small hiatal hernia.   THYROID CYST EXCISION  1973   TONSILLECTOMY     Patient Active Problem List   Diagnosis Date Noted   Acute pain of right shoulder 05/10/2023   Chronic midline low back pain without sciatica 11/08/2021   Osteopenia 11/23/2015   GAD (generalized anxiety disorder) 10/23/2014   Insomnia 10/23/2014   Arthritis 10/23/2014   Hyperlipidemia 10/23/2014   Allergic rhinitis 08/12/2012   REFERRING PROVIDER: Ricki Miller MD  REFERRING DIAG: Acute pain of right shoulder.  Traumatic complete tear of right RTC.    THERAPY DIAG:  Acute pain of right shoulder  Muscle weakness (generalized)  Other muscle spasm  Rationale for Evaluation and Treatment: Rehabilitation  ONSET DATE: 05/04/23.  SUBJECTIVE:                                                                                                                                                                                       SUBJECTIVE STATEMENT: The patient presents to the clinic with c/o right shoulder pain as the result of tripping on a rug at a local tire shop and falling on his right shoulder.  Since his accident he has been working on moving his shoulder and has regained some motion.  His pain is rated at a 5/10.  Lying down at night increases his pain.  Rest decreases his pain.    PERTINENT HISTORY: See above.    PAIN:  Are you having pain? Yes: NPRS scale: 5/10. Pain location: Right shoulder.  Pain description: Sore and throbbing. Aggravating factors: As above. Relieving factors: As above.   PRECAUTIONS: None  RED FLAGS: None   WEIGHT BEARING RESTRICTIONS: No  FALLS:  Has patient fallen in last 6 months? As described above.  LIVING ENVIRONMENT: Lives with: lives with their spouse Lives in: House/apartment Has following equipment at home: None  OCCUPATION: Retired.    PLOF: Independent  PATIENT GOALS:  Improve motion and decrease pain.    NEXT MD VISIT:   OBJECTIVE:  Note: Objective measures were completed at Evaluation unless otherwise noted.  DIAGNOSTIC FINDINGS:  CT:  "concern for tearing of the Supraspinatus."  PATIENT SURVEYS:  Quick Dash 43.18.  POSTURE: Forward head and rounded shoulders.  UPPER EXTREMITY ROM:   The patient performed right shoulder flexion while seated in a punch fashion to 95 degrees and passive to 135 degrees.  His behind the back is thumb to L2-3.  He has full active ER.  He uses momentum to place his right palm on his left shoulder.    UPPER EXTREMITY MMT:  Right shoulder flexion is 3-/5, IR/ER strength is a solid 4/5 and abduction is 2-/5.    SHOULDER SPECIAL TESTS: (+) right Drop Arm test.   PALPATION:  Patient c/o palpable tenderness in the region of his right acromial ridge and middle deltoid.                                                                                                                                TREATMENT DATE: 06/08/23:  HMP and IFC at 80-150 Hz on 40% scan x 20 minutes.  Normal modality response following removal of modality.   PATIENT EDUCATION: Education details: Discussed prognosis. Person educated: Patient Education method: Explanation Education comprehension: verbalized understanding  HOME EXERCISE PROGRAM:   ASSESSMENT:  CLINICAL IMPRESSION: The patient presents to OPPT with c/o right shoulder pain and loss of motion due to tripping over a rug and falling onto his right shoulder on 05/04/23.  He c/o palpable tenderness in the region of his right acromial ridge and middle deltoid.  He demonstrates a positive right Drop Arm test.  His Quick DASH score is 43.18.  He has a loss of active motion and a decreased ability to perform ADL's like he did before his fall.  Patient will benefit from skilled physical therapy intervention to address pain and deficits.  OBJECTIVE IMPAIRMENTS: decreased activity tolerance, decreased ROM, decreased strength, increased muscle spasms, and pain.   ACTIVITY LIMITATIONS: carrying, lifting, and reach over head  PARTICIPATION LIMITATIONS: meal prep, cleaning, laundry, and yard work  [facility] POTENTIAL: Morene Antu Morene Antu  CLINICAL DECISION MAKING: Stable/uncomplicated  EVALUATION COMPLEXITY: Low   GOALS: LONG TERM GOALS: Target date: 07/20/23  Ind with a HEP.  Goal status: INITIAL  2.  Right shoulder antigravity flexion to 125 degrees.  Goal status: INITIAL  3.  Perform ADL's with right shoulder pain not > 3/10.  Goal status: INITIAL  PLAN:  PT FREQUENCY: 2x/week  PT DURATION: 6 weeks  PLANNED INTERVENTIONS: 97110-Therapeutic exercises, 97530- Therapeutic activity, [id number]- Neuromuscular re-education, 97535- Self Care, 08657- Manual therapy, [id number]- Electrical stimulation (unattended), 97016- Vasopneumatic device, 97035- Ultrasound, Patient/Family education, Cryotherapy, and Moist heat  PLAN FOR NEXT SESSION: Pulleys, AAROM, UE Ranger, wall  ladder. Modalities PRN.    Kert Shackett, [place], PT 06/08/2023, 12:43 PM

## 2023-06-12 ENCOUNTER — Telehealth: Payer: Self-pay

## 2023-06-12 DIAGNOSIS — M170 Bilateral primary osteoarthritis of knee: Secondary | ICD-10-CM

## 2023-06-12 NOTE — Telephone Encounter (Signed)
 Called and left a VM advising patient that next gel injection would need to be after 07/09/2023.  See Referrals tab

## 2023-06-14 ENCOUNTER — Ambulatory Visit: Attending: Orthopedic Surgery | Admitting: Physical Therapy

## 2023-06-14 DIAGNOSIS — M6281 Muscle weakness (generalized): Secondary | ICD-10-CM | POA: Diagnosis not present

## 2023-06-14 DIAGNOSIS — M25511 Pain in right shoulder: Secondary | ICD-10-CM | POA: Diagnosis not present

## 2023-06-14 DIAGNOSIS — M62838 Other muscle spasm: Secondary | ICD-10-CM | POA: Diagnosis not present

## 2023-06-14 NOTE — Therapy (Signed)
 OUTPATIENT PHYSICAL THERAPY SHOULDER EVALUATION   Patient Name: Craig Baird MRN: [id number] DOB:[birth year], 89 y.o., male Today's Date: 06/14/2023  END OF SESSION:  PT End of Session - 06/14/23 1514     Visit Number 2    Number of Visits 12    Date for PT Re-Evaluation 07/20/23    PT Start Time 0310    PT Stop Time 0402    PT Time Calculation (min) 52 min    Activity Tolerance Patient tolerated treatment well    Behavior During Therapy [facility] for tasks assessed/performed             Past Medical History:  Diagnosis Date   Allergy    Anxiety    Cataract    Diverticulosis    Hernia    L inguinal hernia   Hiatal hernia    Hyperlipidemia    SCCA (squamous cell carcinoma) of skin 09/22/2019   Right Forearem-posterior (well diff) (treatment after biopsy)   Sinusitis    Past Surgical History:  Procedure Laterality Date   COLONOSCOPY N/A 09/15/2016   Dr. Jena Gauss: Diverticulosis, several polyps removed, couple of tubular adenomas.  No future surveillance colonoscopies recommended given age.   ESOPHAGOGASTRODUODENOSCOPY N/A 09/15/2016   Dr. Jena Gauss: small hiatal hernia.   THYROID CYST EXCISION  1973   TONSILLECTOMY     Patient Active Problem List   Diagnosis Date Noted   Acute pain of right shoulder 05/10/2023   Chronic midline low back pain without sciatica 11/08/2021   Osteopenia 11/23/2015   GAD (generalized anxiety disorder) 10/23/2014   Insomnia 10/23/2014   Arthritis 10/23/2014   Hyperlipidemia 10/23/2014   Allergic rhinitis 08/12/2012   REFERRING PROVIDER: Ricki Miller MD  REFERRING DIAG: Acute pain of right shoulder.  Traumatic complete tear of right RTC.    THERAPY DIAG:  Acute pain of right shoulder  Muscle weakness (generalized)  Other muscle spasm  Rationale for Evaluation and Treatment: Rehabilitation  ONSET DATE: 05/04/23.  SUBJECTIVE:                                                                                                                                                                                       SUBJECTIVE STATEMENT: Shoulder is sore.  PERTINENT HISTORY: See above.    PAIN:  Are you having pain? Yes: NPRS scale: 5/10. Pain location: Right shoulder. Pain description: Sore and throbbing. Aggravating factors: As above. Relieving factors: As above.   PRECAUTIONS: None  RED FLAGS: None   WEIGHT BEARING RESTRICTIONS: No  FALLS:  Has patient fallen in last 6 months? As described above.  LIVING ENVIRONMENT: Lives with: lives with their spouse Lives in:  House/apartment Has following equipment at home: None  OCCUPATION: Retired.    PLOF: Independent  PATIENT GOALS:  Improve motion and decrease pain.    NEXT MD VISIT:   OBJECTIVE:  Note: Objective measures were completed at Evaluation unless otherwise noted.  DIAGNOSTIC FINDINGS:  CT:  "concern for tearing of the Supraspinatus."  PATIENT SURVEYS:  Quick Dash 43.18.  POSTURE: Forward head and rounded shoulders.  UPPER EXTREMITY ROM:   The patient performed right shoulder flexion while seated in a punch fashion to 95 degrees and passive to 135 degrees.  His behind the back is thumb to L2-3.  He has full active ER.  He uses momentum to place his right palm on his left shoulder.    UPPER EXTREMITY MMT:  Right shoulder flexion is 3-/5, IR/ER strength is a solid 4/5 and abduction is 2-/5.    SHOULDER SPECIAL TESTS: (+) right Drop Arm test.   PALPATION:  Patient c/o palpable tenderness in the region of his right acromial ridge and middle deltoid.                                                                                                                               TREATMENT DATE:   06/14/23:                                     EXERCISE LOG  Exercise Repetitions and Resistance Comments  Pulleys 7 minutes   Wall ladder 4 minutes   Towel on wall  2 minutes   Supine cane Bench press and overhead to fatigue x 3       In supine:  AAROM into flexion, ER/IR, punches and rhy stabs (6 minutes) f/b low-level  HMP and IFC at 80-150 Hz on 40% scan x 20 minutes.  Normal modality response following removal of modality.    06/08/23:  HMP and IFC at 80-150 Hz on 40% scan x 20 minutes.  Normal modality response following removal of modality.   PATIENT EDUCATION: Education details: Discussed prognosis. Person educated: Patient Education method: Explanation Education comprehension: verbalized understanding  HOME EXERCISE PROGRAM:   ASSESSMENT:  CLINICAL IMPRESSION: The patient is very motivated and did very well with multiple new interventions including pulleys, wall ladder and supine cane exercises.  Verbal and tactile cues were necessary for correct technique.    OBJECTIVE IMPAIRMENTS: decreased activity tolerance, decreased ROM, decreased strength, increased muscle spasms, and pain.   ACTIVITY LIMITATIONS: carrying, lifting, and reach over head  PARTICIPATION LIMITATIONS: meal prep, cleaning, laundry, and yard work  [facility] POTENTIAL: Morene Antu Morene Antu  CLINICAL DECISION MAKING: Stable/uncomplicated  EVALUATION COMPLEXITY: Low   GOALS: LONG TERM GOALS: Target date: 07/20/23  Ind with a HEP.  Goal status: INITIAL  2.  Right shoulder antigravity flexion to 125 degrees.  Goal status: INITIAL  3.  Perform ADL's with right shoulder pain not >  3/10.  Goal status: INITIAL  PLAN:  PT FREQUENCY: 2x/week  PT DURATION: 6 weeks  PLANNED INTERVENTIONS: 97110-Therapeutic exercises, 97530- Therapeutic activity, [id number]- Neuromuscular re-education, 97535- Self Care, 16109- Manual therapy, G0283- Electrical stimulation (unattended), 97016- Vasopneumatic device, 97035- Ultrasound, Patient/Family education, Cryotherapy, and Moist heat  PLAN FOR NEXT SESSION: Pulleys, AAROM, UE Ranger, wall ladder. Modalities PRN.    Tres Grzywacz, [place], PT 06/14/2023, 4:03 PM

## 2023-06-21 ENCOUNTER — Ambulatory Visit: Admitting: *Deleted

## 2023-06-21 ENCOUNTER — Encounter: Payer: Self-pay | Admitting: *Deleted

## 2023-06-21 DIAGNOSIS — M62838 Other muscle spasm: Secondary | ICD-10-CM

## 2023-06-21 DIAGNOSIS — M6281 Muscle weakness (generalized): Secondary | ICD-10-CM | POA: Diagnosis not present

## 2023-06-21 DIAGNOSIS — M25511 Pain in right shoulder: Secondary | ICD-10-CM

## 2023-06-21 NOTE — Therapy (Signed)
 OUTPATIENT PHYSICAL THERAPY SHOULDER TREATMENT   Patient Name: Craig Baird MRN: [id number] DOB:1933/09/22, 89 y.o., male Today's Date: 06/21/2023  END OF SESSION:  PT End of Session - 06/21/23 1524     Visit Number 3    Number of Visits 12    Date for PT Re-Evaluation 07/20/23    PT Start Time 1515    PT Stop Time 1604    PT Time Calculation (min) 49 min             Past Medical History:  Diagnosis Date   Allergy    Anxiety    Cataract    Diverticulosis    Hernia    L inguinal hernia   Hiatal hernia    Hyperlipidemia    SCCA (squamous cell carcinoma) of skin 09/22/2019   Right Forearem-posterior (well diff) (treatment after biopsy)   Sinusitis    Past Surgical History:  Procedure Laterality Date   COLONOSCOPY N/A 09/15/2016   Dr. Jena Gauss: Diverticulosis, several polyps removed, couple of tubular adenomas.  No future surveillance colonoscopies recommended given age.   ESOPHAGOGASTRODUODENOSCOPY N/A 09/15/2016   Dr. Jena Gauss: small hiatal hernia.   THYROID CYST EXCISION  1973   TONSILLECTOMY     Patient Active Problem List   Diagnosis Date Noted   Acute pain of right shoulder 05/10/2023   Chronic midline low back pain without sciatica 11/08/2021   Osteopenia 11/23/2015   GAD (generalized anxiety disorder) 10/23/2014   Insomnia 10/23/2014   Arthritis 10/23/2014   Hyperlipidemia 10/23/2014   Allergic rhinitis 08/12/2012   REFERRING PROVIDER: Ricki Miller MD  REFERRING DIAG: Acute pain of right shoulder.  Traumatic complete tear of right RTC.    THERAPY DIAG:  Acute pain of right shoulder  Muscle weakness (generalized)  Other muscle spasm  Rationale for Evaluation and Treatment: Rehabilitation  ONSET DATE: 05/04/23.  SUBJECTIVE:                                                                                                                                                                                      SUBJECTIVE STATEMENT: RT shldr  is sore  today.Did okay after last Rx  PERTINENT HISTORY: See above.    PAIN:  Are you having pain? Yes: NPRS scale: 5/10. Pain location: Right shoulder. Pain description: Sore and throbbing. Aggravating factors: As above. Relieving factors: As above.   PRECAUTIONS: None  RED FLAGS: None   WEIGHT BEARING RESTRICTIONS: No  FALLS:  Has patient fallen in last 6 months? As described above.  LIVING ENVIRONMENT: Lives with: lives with their spouse Lives in: House/apartment Has following equipment at home: None  OCCUPATION: Retired.  PLOF: Independent  PATIENT GOALS:  Improve motion and decrease pain.    NEXT MD VISIT:   OBJECTIVE:  Note: Objective measures were completed at Evaluation unless otherwise noted.  DIAGNOSTIC FINDINGS:  CT:  "concern for tearing of the Supraspinatus."  PATIENT SURVEYS:  Quick Dash 43.18.  POSTURE: Forward head and rounded shoulders.  UPPER EXTREMITY ROM:   The patient performed right shoulder flexion while seated in a punch fashion to 95 degrees and passive to 135 degrees.  His behind the back is thumb to L2-3.  He has full active ER.  He uses momentum to place his right palm on his left shoulder.    UPPER EXTREMITY MMT:  Right shoulder flexion is 3-/5, IR/ER strength is a solid 4/5 and abduction is 2-/5.    SHOULDER SPECIAL TESTS: (+) right Drop Arm test.   PALPATION:  Patient c/o palpable tenderness in the region of his right acromial ridge and middle deltoid.                                                                                                                               TREATMENT DATE:   06/21/23:                                     EXERCISE LOG  Exercise Repetitions and Resistance Comments  Pulleys 6 minutes   Seated UE X 5 MINS   Wall ladder    Towel on wall     Supine cane Bench press and overhead to fatigue x 3       In supine: AAROM into flexion, ER/IR, punches and rhy stabs ( )   low-level  HMP  and IFC at 80-150 Hz on 40% scan x .  Normal modality response following removal of modality.    06/08/23:     HMP and IFC at 80-150 Hz on 40% scan x 20 minutes.  Normal modality response following removal of modality.   PATIENT EDUCATION: Education details: Discussed prognosis. Person educated: Patient Education method: Explanation Education comprehension: verbalized understanding  HOME EXERCISE PROGRAM:   ASSESSMENT:  CLINICAL IMPRESSION: The patient  arrived today doing fair with RT shldr and reports soreness/ pain.  Rx focused on AAROM exs seated and supine as well as manual AAROM and PROM  Verbal and tactile cues were necessary for correct technique.    OBJECTIVE IMPAIRMENTS: decreased activity tolerance, decreased ROM, decreased strength, increased muscle spasms, and pain.   ACTIVITY LIMITATIONS: carrying, lifting, and reach over head  PARTICIPATION LIMITATIONS: meal prep, cleaning, laundry, and yard work  [facility] POTENTIAL: Morene Antu Morene Antu  CLINICAL DECISION MAKING: Stable/uncomplicated  EVALUATION COMPLEXITY: Low   GOALS: LONG TERM GOALS: Target date: 07/20/23  Ind with a HEP.  Goal status: INITIAL  2.  Right shoulder antigravity flexion to 125 degrees.  Goal status: INITIAL  3.  Perform ADL's with right  shoulder pain not > 3/10.  Goal status: INITIAL  PLAN:  PT FREQUENCY: 2x/week  PT DURATION: 6 weeks  PLANNED INTERVENTIONS: 97110-Therapeutic exercises, 97530- Therapeutic activity, [id number]- Neuromuscular re-education, 97535- Self Care, 16109- Manual therapy, G0283- Electrical stimulation (unattended), 97016- Vasopneumatic device, 97035- Ultrasound, Patient/Family education, Cryotherapy, and Moist heat  PLAN FOR NEXT SESSION: Pulleys, AAROM, UE Ranger, wall ladder. Modalities PRN.    Aislin Onofre,CHRIS, PTA 06/21/2023, 5:13 PM

## 2023-06-27 ENCOUNTER — Ambulatory Visit: Payer: Medicare Other | Admitting: Orthopedic Surgery

## 2023-06-28 ENCOUNTER — Ambulatory Visit

## 2023-06-28 DIAGNOSIS — M62838 Other muscle spasm: Secondary | ICD-10-CM | POA: Diagnosis not present

## 2023-06-28 DIAGNOSIS — M6281 Muscle weakness (generalized): Secondary | ICD-10-CM | POA: Diagnosis not present

## 2023-06-28 DIAGNOSIS — M25511 Pain in right shoulder: Secondary | ICD-10-CM

## 2023-06-28 NOTE — Therapy (Signed)
 OUTPATIENT PHYSICAL THERAPY SHOULDER TREATMENT   Patient Name: Craig Baird MRN: [id number] DOB:[birth year], 89 y.o., male Today's Date: 06/28/2023  END OF SESSION:  PT End of Session - 06/28/23 1551     Visit Number 4    Number of Visits 12    Date for PT Re-Evaluation 07/20/23    PT Start Time 1550    PT Stop Time 1641    PT Time Calculation (min) 51 min             Past Medical History:  Diagnosis Date   Allergy    Anxiety    Cataract    Diverticulosis    Hernia    L inguinal hernia   Hiatal hernia    Hyperlipidemia    SCCA (squamous cell carcinoma) of skin 09/22/2019   Right Forearem-posterior (well diff) (treatment after biopsy)   Sinusitis    Past Surgical History:  Procedure Laterality Date   COLONOSCOPY N/A 09/15/2016   Dr. Riley Cheadle: Diverticulosis, several polyps removed, couple of tubular adenomas.  No future surveillance colonoscopies recommended given age.   ESOPHAGOGASTRODUODENOSCOPY N/A 09/15/2016   Dr. Riley Cheadle: small hiatal hernia.   THYROID CYST EXCISION  1973   TONSILLECTOMY     Patient Active Problem List   Diagnosis Date Noted   Acute pain of right shoulder 05/10/2023   Chronic midline low back pain without sciatica 11/08/2021   Osteopenia 11/23/2015   GAD (generalized anxiety disorder) 10/23/2014   Insomnia 10/23/2014   Arthritis 10/23/2014   Hyperlipidemia 10/23/2014   Allergic rhinitis 08/12/2012   REFERRING PROVIDER: Dodson Freestone MD  REFERRING DIAG: Acute pain of right shoulder.  Traumatic complete tear of right RTC.    THERAPY DIAG:  Acute pain of right shoulder  Muscle weakness (generalized)  Other muscle spasm  Rationale for Evaluation and Treatment: Rehabilitation  ONSET DATE: 05/04/23.  SUBJECTIVE:                                                                                                                                                                                      SUBJECTIVE STATEMENT: Pt reports 3/10  right shoulder pain today.  PERTINENT HISTORY: See above.    PAIN:  Are you having pain? Yes: NPRS scale: 3/10. Pain location: Right shoulder. Pain description: Sore and throbbing. Aggravating factors: As above. Relieving factors: As above.   PRECAUTIONS: None  RED FLAGS: None   WEIGHT BEARING RESTRICTIONS: No  FALLS:  Has patient fallen in last 6 months? As described above.  LIVING ENVIRONMENT: Lives with: lives with their spouse Lives in: House/apartment Has following equipment at home: None  OCCUPATION: Retired.    PLOF: Independent  PATIENT GOALS:  Improve motion and decrease pain.    NEXT MD VISIT:   OBJECTIVE:  Note: Objective measures were completed at Evaluation unless otherwise noted.  DIAGNOSTIC FINDINGS:  CT:  "concern for tearing of the Supraspinatus."  PATIENT SURVEYS:  Quick Dash 43.18.  POSTURE: Forward head and rounded shoulders.  UPPER EXTREMITY ROM:   The patient performed right shoulder flexion while seated in a punch fashion to 95 degrees and passive to 135 degrees.  His behind the back is thumb to L2-3.  He has full active ER.  He uses momentum to place his right palm on his left shoulder.    UPPER EXTREMITY MMT:  Right shoulder flexion is 3-/5, IR/ER strength is a solid 4/5 and abduction is 2-/5.    SHOULDER SPECIAL TESTS: (+) right Drop Arm test.   PALPATION:  Patient c/o palpable tenderness in the region of his right acromial ridge and middle deltoid.                                                                                                                               TREATMENT DATE:   06/28/23:                        EXERCISE LOG  Exercise Repetitions and Resistance Comments  Pulleys 5 minutes   Seated UE Flex/ext; CW and CCW x 2 mins each   Wall ladder 5 reps (max 28)   Towel on wall  3 mins   AA Flexion 20 reps    AA Chest Press 20 reps    AA Overhead Press 20 reps    Rows Yellow x 20 reps bil   Extensions  Yellow x 20 reps bil    Horizontal Abduction Yellow x 20 reps bil    low-level  HMP and IFC at 80-150 Hz on 40% scan x 15 minutes.  Normal modality response following removal of modality.  06/08/23:   HMP and IFC at 80-150 Hz on 40% scan x 20 minutes.  Normal modality response following removal of modality.   PATIENT EDUCATION: Education details: Discussed prognosis. Person educated: Patient Education method: Explanation Education comprehension: verbalized understanding  HOME EXERCISE PROGRAM:   ASSESSMENT:  CLINICAL IMPRESSION: Pt arrives for today's treatment session reporting 3/10 right shoulder pain.  Pt able to increase max number to 28 on the wall ladder today.  Pt able to tolerate introduction to seated t-band exercises today with min cues for proper technique and posture.   Normal responses to estim and Mh noted upon removal.  Pt reported decreased pain at completion of today's treatment session.  OBJECTIVE IMPAIRMENTS: decreased activity tolerance, decreased ROM, decreased strength, increased muscle spasms, and pain.   ACTIVITY LIMITATIONS: carrying, lifting, and reach over head  PARTICIPATION LIMITATIONS: meal prep, cleaning, laundry, and yard work  [facility] POTENTIAL: Isidoro Margarita Isidoro Margarita  CLINICAL DECISION MAKING: Stable/uncomplicated  EVALUATION COMPLEXITY: Low   GOALS: LONG  TERM GOALS: Target date: 07/20/23  Ind with a HEP.  Goal status: INITIAL  2.  Right shoulder antigravity flexion to 125 degrees.  Goal status: INITIAL  3.  Perform ADL's with right shoulder pain not > 3/10.  Goal status: INITIAL  PLAN:  PT FREQUENCY: 2x/week  PT DURATION: 6 weeks  PLANNED INTERVENTIONS: 97110-Therapeutic exercises, 97530- Therapeutic activity, [id number]- Neuromuscular re-education, 97535- Self Care, 51025- Manual therapy, G0283- Electrical stimulation (unattended), 97016- Vasopneumatic device, 97035- Ultrasound, Patient/Family education, Cryotherapy, and Moist heat  PLAN FOR NEXT  SESSION: Pulleys, AAROM, UE Ranger, wall ladder. Modalities PRN.    Deryl Flora, PTA 06/28/2023, 4:52 PM

## 2023-07-03 ENCOUNTER — Other Ambulatory Visit: Payer: Self-pay | Admitting: *Deleted

## 2023-07-03 DIAGNOSIS — E785 Hyperlipidemia, unspecified: Secondary | ICD-10-CM

## 2023-07-05 DIAGNOSIS — B351 Tinea unguium: Secondary | ICD-10-CM | POA: Diagnosis not present

## 2023-07-05 DIAGNOSIS — M79674 Pain in right toe(s): Secondary | ICD-10-CM | POA: Diagnosis not present

## 2023-07-05 DIAGNOSIS — M79675 Pain in left toe(s): Secondary | ICD-10-CM | POA: Diagnosis not present

## 2023-07-06 ENCOUNTER — Encounter: Admitting: *Deleted

## 2023-07-12 DIAGNOSIS — M75101 Unspecified rotator cuff tear or rupture of right shoulder, not specified as traumatic: Secondary | ICD-10-CM | POA: Diagnosis not present

## 2023-07-26 ENCOUNTER — Encounter: Payer: Self-pay | Admitting: Surgical

## 2023-07-26 ENCOUNTER — Ambulatory Visit: Admitting: Surgical

## 2023-07-26 DIAGNOSIS — M170 Bilateral primary osteoarthritis of knee: Secondary | ICD-10-CM

## 2023-07-26 MED ORDER — LIDOCAINE HCL 1 % IJ SOLN
5.0000 mL | INTRAMUSCULAR | Status: AC | PRN
  Administered 2023-07-26: 5 mL

## 2023-07-26 MED ORDER — SODIUM HYALURONATE 60 MG/3ML IX PRSY
60.0000 mg | PREFILLED_SYRINGE | INTRA_ARTICULAR | Status: AC | PRN
  Administered 2023-07-26: 60 mg via INTRA_ARTICULAR

## 2023-07-26 NOTE — Progress Notes (Addendum)
   Procedure Note  Patient: Craig Baird             Date of Birth: 03/31/1933           MRN: [id number]             Visit Date: 07/26/2023  Procedures: Visit Diagnoses:  1. Primary osteoarthritis of both knees     Large Joint Inj: bilateral knee on 07/26/2023 1:38 PM Indications: diagnostic evaluation, joint swelling and pain Details: 18 G 1.5 in needle, superolateral approach  Arthrogram: No  Medications (Right): 5 mL lidocaine  1 %; 60 mg Sodium Hyaluronate 60 MG/3ML Medications (Left): 5 mL lidocaine  1 %; 60 mg Sodium Hyaluronate 60 MG/3ML Outcome: tolerated well, no immediate complications  Patient tolerated injections well.  These injections have provided several months of good relief for Arch. Procedure, treatment alternatives, risks and benefits explained, specific risks discussed. Consent was given by the patient. Immediately prior to procedure a time out was called to verify the correct patient, procedure, equipment, support staff and site/side marked as required. Patient was prepped and draped in the usual sterile fashion.

## 2023-09-10 ENCOUNTER — Other Ambulatory Visit (HOSPITAL_COMMUNITY): Payer: Self-pay

## 2023-09-26 DIAGNOSIS — L570 Actinic keratosis: Secondary | ICD-10-CM | POA: Diagnosis not present

## 2023-09-26 DIAGNOSIS — X32XXXD Exposure to sunlight, subsequent encounter: Secondary | ICD-10-CM | POA: Diagnosis not present

## 2023-09-26 DIAGNOSIS — L2989 Other pruritus: Secondary | ICD-10-CM | POA: Diagnosis not present

## 2023-09-26 DIAGNOSIS — C44619 Basal cell carcinoma of skin of left upper limb, including shoulder: Secondary | ICD-10-CM | POA: Diagnosis not present

## 2023-09-26 DIAGNOSIS — D225 Melanocytic nevi of trunk: Secondary | ICD-10-CM | POA: Diagnosis not present

## 2023-11-26 ENCOUNTER — Other Ambulatory Visit (INDEPENDENT_AMBULATORY_CARE_PROVIDER_SITE_OTHER)

## 2023-11-26 ENCOUNTER — Ambulatory Visit (INDEPENDENT_AMBULATORY_CARE_PROVIDER_SITE_OTHER): Admitting: Orthopedic Surgery

## 2023-11-26 DIAGNOSIS — M25511 Pain in right shoulder: Secondary | ICD-10-CM

## 2023-11-26 DIAGNOSIS — M170 Bilateral primary osteoarthritis of knee: Secondary | ICD-10-CM | POA: Diagnosis not present

## 2023-11-26 DIAGNOSIS — M25512 Pain in left shoulder: Secondary | ICD-10-CM

## 2023-11-27 ENCOUNTER — Encounter: Payer: Self-pay | Admitting: Orthopedic Surgery

## 2023-11-27 MED ORDER — LIDOCAINE HCL 1 % IJ SOLN
5.0000 mL | INTRAMUSCULAR | Status: AC | PRN
  Administered 2023-11-26: 5 mL

## 2023-11-27 MED ORDER — BUPIVACAINE HCL 0.25 % IJ SOLN
4.0000 mL | INTRAMUSCULAR | Status: AC | PRN
  Administered 2023-11-26: 4 mL via INTRA_ARTICULAR

## 2023-11-27 MED ORDER — TRIAMCINOLONE ACETONIDE 40 MG/ML IJ SUSP
40.0000 mg | INTRAMUSCULAR | Status: AC | PRN
  Administered 2023-11-26: 40 mg via INTRA_ARTICULAR

## 2023-11-27 NOTE — Progress Notes (Signed)
 Office Visit Note   Patient: Craig Baird           Date of Birth: Jan 06, 1934           MRN: [id number] Visit Date: 11/26/2023 Requested by: Dettinger, Fonda LABOR, MD [address] [place],  [place] [address] PCP: Dettinger, Fonda LABOR, MD  Subjective: Chief Complaint  Patient presents with   Other    Bilateral knee pain-possible repeat injections Bilateral shoulder pain since he had a fall February.  Reports R>L    HPI: Craig Baird is a 89 y.o. male who presents to the office reporting bilateral knee pain and bilateral shoulder pain.  Right shoulder hurts more than left shoulder.  Would like to try another cortisone injection.  Had gel injection in May without relief.  Both shoulders do hurt him as well.  He took a fall in February.  Reports some decreased range of motion at times.  Hard to sleep because of soreness..                ROS: All systems reviewed are negative as they relate to the chief complaint within the history of present illness.  Patient denies fevers or chills.  Assessment & Plan: Visit Diagnoses:  1. Bilateral shoulder pain, unspecified chronicity     Plan: Impression is bilateral knee arthritis.  Left worse than right.  Cortisone injections performed today.  I think we can try him on Zilretta  since the gel shots do not work.  Both shoulders also examined.  He may have some rotator cuff pathology which is not severe on the right-hand side.  Left side looks pretty reasonable.  We could consider injections in his shoulder within a week or 2.  Follow-Up Instructions: No follow-ups on file.   Orders:  Orders Placed This Encounter  Procedures   XR Shoulder Right   XR Shoulder Left   No orders of the defined types were placed in this encounter.     Procedures: Large Joint Inj: bilateral knee on 11/26/2023 11:29 AM Indications: diagnostic evaluation, joint swelling and pain Details: 18 G 1.5 in needle, superolateral approach  Arthrogram: No  Medications  (Right): 5 mL lidocaine  1 %; 4 mL bupivacaine  0.25 %; 40 mg triamcinolone  acetonide 40 MG/ML Medications (Left): 5 mL lidocaine  1 %; 4 mL bupivacaine  0.25 %; 40 mg triamcinolone  acetonide 40 MG/ML Outcome: tolerated well, no immediate complications Procedure, treatment alternatives, risks and benefits explained, specific risks discussed. Consent was given by the patient. Immediately prior to procedure a time out was called to verify the correct patient, procedure, equipment, support staff and site/side marked as required. Patient was prepped and draped in the usual sterile fashion.       Clinical Data: No additional findings.  Objective: Vital Signs: There were no vitals taken for this visit.  Physical Exam:  Constitutional: Patient appears well-developed HEENT:  Head: Normocephalic Eyes:EOM are normal Neck: Normal range of motion Cardiovascular: Normal rate Pulmonary/chest: Effort normal Neurologic: Patient is alert Skin: Skin is warm Psychiatric: Patient has normal mood and affect  Ortho Exam: Ortho exam demonstrates flexion contractures of about 7 or 8 degrees in both knees.  Has effusion in the left knee no effusion in the right knee.  Collateral crucial ligaments are stable.  Both shoulders have pretty reasonable passive range of motion with good strength internal/external rotation at 15 degrees of abduction.  He does have mild crepitus with passive range of motion on the right compared to the  left.  Specialty Comments:  No specialty comments available.  Imaging: No results found.   PMFS History: Patient Active Problem List   Diagnosis Date Noted   Acute pain of right shoulder 05/10/2023   Chronic midline low back pain without sciatica 11/08/2021   Osteopenia 11/23/2015   GAD (generalized anxiety disorder) 10/23/2014   Insomnia 10/23/2014   Arthritis 10/23/2014   Hyperlipidemia 10/23/2014   Allergic rhinitis 08/12/2012   Past Medical History:  Diagnosis Date    Allergy    Anxiety    Cataract    Diverticulosis    Hernia    L inguinal hernia   Hiatal hernia    Hyperlipidemia    SCCA (squamous cell carcinoma) of skin 09/22/2019   Right Forearem-posterior (well diff) (treatment after biopsy)   Sinusitis     Family History  Problem Relation Age of Onset   Hypertension Mother    Stroke Mother    Parkinson's disease Mother    Hip fracture Mother    Hypertension Brother 52   Heart disease Brother    Birth defects Father        spine   Early death Sister    Early death Sister    Colon cancer Neg Hx    Gastric cancer Neg Hx    Esophageal cancer Neg Hx     Past Surgical History:  Procedure Laterality Date   COLONOSCOPY N/A 09/15/2016   Dr. Shaaron: Diverticulosis, several polyps removed, couple of tubular adenomas.  No future surveillance colonoscopies recommended given age.   ESOPHAGOGASTRODUODENOSCOPY N/A 09/15/2016   Dr. Shaaron: small hiatal hernia.   THYROID  CYST EXCISION  1973   TONSILLECTOMY     Social History   Occupational History   Not on file  Tobacco Use   Smoking status: Never   Smokeless tobacco: Never  Vaping Use   Vaping status: Never Used  Substance and Sexual Activity   Alcohol use: No   Drug use: No   Sexual activity: Yes

## 2023-12-03 DIAGNOSIS — H16223 Keratoconjunctivitis sicca, not specified as Sjogren's, bilateral: Secondary | ICD-10-CM | POA: Diagnosis not present

## 2023-12-06 DIAGNOSIS — K5900 Constipation, unspecified: Secondary | ICD-10-CM | POA: Diagnosis not present

## 2023-12-06 DIAGNOSIS — I10 Essential (primary) hypertension: Secondary | ICD-10-CM | POA: Diagnosis not present

## 2023-12-06 DIAGNOSIS — Z79899 Other long term (current) drug therapy: Secondary | ICD-10-CM | POA: Diagnosis not present

## 2023-12-06 DIAGNOSIS — Z6822 Body mass index (BMI) 22.0-22.9, adult: Secondary | ICD-10-CM | POA: Diagnosis not present

## 2023-12-06 DIAGNOSIS — Z882 Allergy status to sulfonamides status: Secondary | ICD-10-CM | POA: Diagnosis not present

## 2023-12-06 DIAGNOSIS — R109 Unspecified abdominal pain: Secondary | ICD-10-CM | POA: Diagnosis not present

## 2023-12-10 ENCOUNTER — Ambulatory Visit: Payer: Self-pay

## 2023-12-10 NOTE — Telephone Encounter (Signed)
 FYI Only or Action Required?: FYI only for provider.  Patient was last seen in primary care on 05/10/2023 by Deitra Morton Sebastian Nena, NP.  Called Nurse Triage reporting Constipation.  Symptoms began several days ago.  Interventions attempted: OTC medications: several laxatives.  Symptoms are: gradually worsening.  Triage Disposition: See Physician Within 24 Hours  Patient/caregiver understands and will follow disposition?: Yes - pt states he will go to ED if he does not have a bm. No appts available in office.             Copied from CRM [id number]. Topic: Clinical - Red Word Triage >> Dec 10, 2023  4:58 PM Leonette P wrote: Red Word that prompted transfer to Nurse Triage: very constipated.  Went to ER last week.  Still constipate Reason for Disposition  Last bowel movement (BM) > 4 days ago  Answer Assessment - Initial Assessment Questions Pt states he has not had a bm since leaving the hospital. He has taken 4 laxatives with no success. No appointments available for tomorrow - pt states if his bowels don't move he will go back to ED.     1. STOOL PATTERN OR FREQUENCY: How often do you have a bowel movement (BM)?  (Normal range: 3 times a day to every 3 days)  When was your last BM?       3-4 days 2. STRAINING: Do you have to strain to have a BM?      Feels like he has a have BM 3. ONSET: When did the constipation begin?     Yes - before 9/25 4. RECTAL PAIN: Does your rectum hurt when the stool comes out? If Yes, ask: Do you have hemorrhoids? How bad is the pain?  (Scale 1-10; or mild, moderate, severe)     Not really 5. BM COMPOSITION: Are the stools hard?      no 6. BLOOD ON STOOLS: Has there been any blood on the toilet tissue or on the surface of the BM? If Yes, ask: When was the last time?     no 7. CHRONIC CONSTIPATION: Is this a new problem for you?  If No, ask: How long have you had this problem? (days, weeks, months)      Ongoing - much  worse since the Sept 25th 8. CHANGES IN DIET OR HYDRATION: Have there been any recent changes in your diet? How much fluids are you drinking on a daily basis?  How much have you had to drink today?     no 9. MEDICINES: Have you been taking any new medicines? Are you taking any narcotic pain medicines? (e.g., Dilaudid, morphine, Percocet, Vicodin)     no 10. LAXATIVES: Have you been using any stool softeners, laxatives, or enemas?  If Yes, ask What are you using, how often, and when was the last time?       Took 4 laxatives today - exlax 25 mg and equate gentle laxative 5 mg 11. ACTIVITY:  How much walking do you do every day?  Has your activity level decreased in the past week?        yes 12. CAUSE: What do you think is causing the constipation?        unsure 13. MEDICAL HISTORY: Do you have a history of hemorrhoids, rectal fissures, rectal surgery, or rectal abscess?         no 14. OTHER SYMPTOMS: Do you have any other symptoms? (e.g., abdomen pain, bloating, fever, vomiting)  no  Protocols used: Constipation-A-AH

## 2023-12-11 NOTE — Telephone Encounter (Signed)
 Left message for pt to return call if he still has concerns. Will offer same day appt if needed.

## 2023-12-24 ENCOUNTER — Ambulatory Visit: Payer: Self-pay

## 2023-12-24 NOTE — Telephone Encounter (Signed)
 FYI Only or Action Required?: FYI only for provider.  Patient was last seen in primary care on 05/10/2023 by Craig Morton Sebastian Nena, NP.  Called Nurse Triage reporting Shoulder Pain and Constipation.  Symptoms began several months ago.  Interventions attempted: OTC medications: laxative and Other: applying rubbing alcohol to shoulders.  Symptoms are: unchanged.  Triage Disposition: See PCP Within 2 Weeks  Patient/caregiver understands and will follow disposition?: Yes                             Copied from CRM [id number]. Topic: Clinical - Red Word Triage >> Dec 24, 2023  9:15 AM Farrel B wrote: [facility] that prompted transfer to Nurse Triage: patient states he's have really bad arthritic pain in both shoulders as well as severe constipation. Reason for Disposition  Shoulder pain is a chronic symptom (recurrent or ongoing AND present > 4 weeks)  Answer Assessment - Initial Assessment Questions 1. ONSET: When did the pain start?     Estimates less than a year 2. LOCATION: Where is the pain located?     Bilateral shoulders, denies worsening symptoms 3. PAIN: How bad is the pain? (Scale 1-10; or mild, moderate, severe)     Rates pain a 4-5 4. WORK OR EXERCISE: Has there been any recent work or exercise that involved this part of the body?     States he had a fall at the beginning of the year that affected his right shoulder 5. CAUSE: What do you think is causing the shoulder pain?     Arthritis  6. OTHER SYMPTOMS: Do you have any other symptoms? (e.g., neck pain, swelling, rash, fever, numbness, weakness)     Denies numbness, denies swelling, denies numbness, still has ROM in arms 7. PREGNANCY: Is there any chance you are pregnant? When was your last menstrual period?     N/A  Answer Assessment - Initial Assessment Questions 1. STOOL PATTERN OR FREQUENCY: How often do you have a bowel movement (BM)?  (Normal range: 3 times a day to  every 3 days)  When was your last BM?       Last BM was 3 days ago, normal range was everyday  2. STRAINING: Do you have to strain to have a BM?      Yes  3. ONSET: When did the constipation begin?     Ongoing for 6 months  4. RECTAL PAIN: Does your rectum hurt when the stool comes out? If Yes, ask: Do you have hemorrhoids? How bad is the pain?  (Scale 1-10; or mild, moderate, severe)     Denies 5. BM COMPOSITION: Are the stools hard?      States taking laxative helps to soften the stools 6. BLOOD ON STOOLS: Has there been any blood on the toilet tissue or on the surface of the BM? If Yes, ask: When was the last time?     Denies 7. CHRONIC CONSTIPATION: Is this a new problem for you?  If No, ask: How long have you had this problem? (days, weeks, months)      6 months 8. CHANGES IN DIET OR HYDRATION: Have there been any recent changes in your diet? How much fluids are you drinking on a daily basis?  How much have you had to drink today?     States he eats a well-balanced diet 9. MEDICINES: Have you been taking any new medicines? Are you taking any narcotic pain  medicines? (e.g., Dilaudid, morphine, Percocet, Vicodin)     Denies 10. LAXATIVES: Have you been using any stool softeners, laxatives, or enemas?  If Yes, ask What are you using, how often, and when was the last time?     OTC laxative  11. ACTIVITY:  How much walking do you do every day?  Has your activity level decreased in the past week?      States he stays active throughout the day  12. CAUSE: What do you think is causing the constipation?      Unsure 14. OTHER SYMPTOMS: Do you have any other symptoms? (e.g., abdomen pain, bloating, fever, vomiting)     Denies abdominal pain, denies abdominal bloating, denies vomiting  Protocols used: Shoulder Pain-A-AH, Constipation-A-AH

## 2023-12-24 NOTE — Telephone Encounter (Signed)
 Appt made.

## 2023-12-28 ENCOUNTER — Ambulatory Visit (INDEPENDENT_AMBULATORY_CARE_PROVIDER_SITE_OTHER): Admitting: Family Medicine

## 2023-12-28 ENCOUNTER — Encounter: Payer: Self-pay | Admitting: Family Medicine

## 2023-12-28 VITALS — BP 137/68 | HR 84 | Ht 65.0 in | Wt 147.0 lb

## 2023-12-28 DIAGNOSIS — M19011 Primary osteoarthritis, right shoulder: Secondary | ICD-10-CM

## 2023-12-28 DIAGNOSIS — M19012 Primary osteoarthritis, left shoulder: Secondary | ICD-10-CM | POA: Diagnosis not present

## 2023-12-28 DIAGNOSIS — L299 Pruritus, unspecified: Secondary | ICD-10-CM

## 2023-12-28 DIAGNOSIS — J309 Allergic rhinitis, unspecified: Secondary | ICD-10-CM | POA: Diagnosis not present

## 2023-12-28 DIAGNOSIS — K5900 Constipation, unspecified: Secondary | ICD-10-CM

## 2023-12-28 MED ORDER — METHYLPREDNISOLONE ACETATE 80 MG/ML IJ SUSP
80.0000 mg | Freq: Once | INTRAMUSCULAR | Status: AC
  Administered 2023-12-28: 80 mg via INTRA_ARTICULAR

## 2023-12-28 MED ORDER — METHYLPREDNISOLONE ACETATE 80 MG/ML IJ SUSP
80.0000 mg | Freq: Once | INTRAMUSCULAR | Status: AC
  Administered 2024-02-29: 80 mg via INTRA_ARTICULAR

## 2023-12-28 NOTE — Progress Notes (Signed)
 BP 137/68   Pulse 84   Ht 5' 5 (1.651 m)   Wt 147 lb (66.7 kg)   SpO2 95%   BMI 24.46 kg/m    Subjective:   Patient ID: Craig Baird, male    DOB: 13-Jul-1933, 89 y.o.   MRN: [id number]  HPI: Craig Baird is a 89 y.o. male presenting on 12/28/2023 for Shoulder Pain (Bilateral/) and Constipation   Discussed the use of AI scribe software for clinical note transcription with the patient, who gave verbal consent to proceed.  History of Present Illness   Craig Baird is an 89 year old male who presents with shoulder pain and constipation.  Shoulder pain - Bilateral shoulder pain, right greater than left - Onset after a fall earlier this year when he tripped over a rug and landed on his right shoulder - X-rays at the time of injury showed no fractures - Pain described as sore with a noticeable popping sensation, especially in the right shoulder - Pain worsened by movement, particularly with arm elevation - No recent trauma or injury to the shoulders beyond the initial fall  Constipation - Persistent constipation despite a diet rich in fruits and vegetables - Previous medications have not been effective - Currently uses MiraLAX, which provides insufficient relief  Pruritus - Generalized itching attributed to dry skin and sun damage - Over-the-counter treatments are as effective as prescribed medications  Throat discomfort and postnasal drainage - Occasional throat discomfort described as a sensation of 'old goods back in making you cough' - Associated with drainage and cough - History of sinus allergies with frequent postnasal drainage          Relevant past medical, surgical, family and social history reviewed and updated as indicated. Interim medical history since our last visit reviewed. Allergies and medications reviewed and updated.  Review of Systems  Constitutional:  Negative for chills and fever.  Eyes:  Negative for visual disturbance.   Respiratory:  Negative for shortness of breath and wheezing.   Cardiovascular:  Negative for chest pain and leg swelling.  Gastrointestinal:  Positive for constipation. Negative for abdominal distention, abdominal pain, blood in stool, diarrhea, nausea and vomiting.  Musculoskeletal:  Positive for arthralgias and myalgias. Negative for back pain, gait problem and joint swelling.  Skin:  Negative for rash.  All other systems reviewed and are negative.   Per HPI unless specifically indicated above   Allergies as of 12/28/2023       Reactions   Sulfa Antibiotics Rash        Medication List        Accurate as of December 28, 2023  3:41 PM. If you have any questions, ask your nurse or doctor.          STOP taking these medications    escitalopram  20 MG tablet Commonly known as: Lexapro  Stopped by: Fonda LABOR Anh Bigos   methylPREDNISolone  4 MG Tbpk tablet Commonly known as: MEDROL  DOSEPAK Stopped by: Fonda LABOR Aldean Pipe       TAKE these medications    diclofenac  Sodium 1 % Gel Commonly known as: Voltaren  Apply 2 g topically 4 (four) times daily.   fluticasone  50 MCG/ACT nasal spray Commonly known as: FLONASE  Place 1 spray into both nostrils 2 (two) times daily as needed for allergies or rhinitis.         Objective:   BP 137/68   Pulse 84   Ht 5' 5 (1.651 m)   Wt 147 lb (  66.7 kg)   SpO2 95%   BMI 24.46 kg/m   Wt Readings from Last 3 Encounters:  12/28/23 147 lb (66.7 kg)  05/10/23 144 lb 6.4 oz (65.5 kg)  04/10/23 143 lb 12.8 oz (65.2 kg)    Physical Exam Physical Exam   HEENT: Throat normal. MUSCULOSKELETAL: Right shoulder sore with movement, popping felt. Left shoulder worse, more popping felt.       Bilateral subacromial shoulder injection: Consent form signed. Risk factors of bleeding and infection discussed with patient and patient is agreeable towards injection. Patient prepped with Betadine.  Posterior approach towards injection used.  Injected 80 mg of Depo-Medrol  and 1 mL of 2% lidocaine . Patient tolerated procedure well and no side effects from noted. Minimal to no bleeding. Simple bandage applied after.   Assessment & Plan:   Problem List Items Addressed This Visit   None Visit Diagnoses       Arthritis of both shoulders    -  Primary   Relevant Medications   methylPREDNISolone  acetate (DEPO-MEDROL ) injection 80 mg (Start on 12/28/2023  3:45 PM)   methylPREDNISolone  acetate (DEPO-MEDROL ) injection 80 mg (Start on 12/28/2023  3:45 PM)     Constipation, unspecified constipation type              Osteoarthritis of both shoulders Chronic osteoarthritis with increased symptoms in the right shoulder. Symptoms consistent with arthritis exacerbation. - Administer corticosteroid injections in both shoulders for symptomatic relief.  Constipation Chronic constipation likely due to age-related slowing of gastrointestinal motility. - Recommend MiraLAX twice daily for five days, then as needed. - Advise increased water intake to enhance MiraLAX effectiveness.  Pruritus Chronic pruritus likely due to age-related skin changes and dryness. - Continue using over-the-counter moisturizers for skin hydration.  Allergic rhinitis with postnasal drainage Throat irritation and cough likely due to postnasal drainage from allergic rhinitis. - Recommend saltwater gargles for throat irritation.          Follow up plan: Return if symptoms worsen or fail to improve.  Counseling provided for all of the vaccine components No orders of the defined types were placed in this encounter.   Fonda Levins, MD [facility] 12/28/2023, 3:41 PM

## 2024-01-07 ENCOUNTER — Ambulatory Visit: Payer: Self-pay

## 2024-01-07 NOTE — Telephone Encounter (Signed)
 FYI Only or Action Required?: Action required by provider: clinical question for provider and update on patient condition. Patient would like medication called in  Patient was last seen in primary care on 12/28/2023 by Dettinger, Fonda LABOR, MD.  Called Nurse Triage reporting Hypertension.  Symptoms began a pretty good whileago.Patient did not pinpoint time frame  Interventions attempted: Nothing.  Symptoms are: gradually worsening.  Triage Disposition: See PCP When Office is Open (Within 3 Days)  Patient/caregiver understands and will follow disposition?: No, refuses disposition   Copied from CRM [id number]. Topic: Clinical - Medical Advice >> Jan 07, 2024  4:52 PM Graeme ORN wrote: Reason for CRM: Patient called. Wants to see if provider can send him something for high blood pressure. States has been running around 150 - 160. Has not been prescribed any blood pressure medicine in a while but would like provider to send some to [place] [facility]. He forgot what the bottom number was running. Thank You   ----------------------------------------------------------------------- From previous Reason for Contact - Medication Refill: Medication:   Has the patient contacted their pharmacy?   (Agent: If no, request that the patient contact the pharmacy for the refill. If patient does not wish to contact the pharmacy document the reason why and proceed with request.) (Agent: If yes, when and what did the pharmacy advise?)  This is the patient's preferred pharmacy:  [facility] [place], [place] - [address] [place] [address] Phone: [phone] Fax: [phone]  [id number] - [place], [place] - [address] [place] [address] Phone: [phone] Fax: [phone]  [facility] [phone]) Specialty - [place]  - [place], [place] - [address] [place] [address] Phone: [phone]  Fax: [phone]  [facility]) - [place], [place] - [address] [facility] [address] [place] [address] [place] [address] Phone: [phone] Fax: [phone]  [facility] [place], [place] - [address] [place] [address] Phone: [phone] Fax: [phone]  Is this the correct pharmacy for this prescription?   If no, delete pharmacy and type the correct one.   Has the prescription been filled recently?    Is the patient out of the medication?    Has the patient been seen for an appointment in the last year OR does the patient have an upcoming appointment?    Can we respond through MyChart?    Agent: Please be advised that Rx refills may take up to 3 business days. We ask that you follow-up with your pharmacy. Reason for Disposition  Systolic BP >= 160 OR Diastolic >= 100  Answer Assessment - Initial Assessment Questions Patient declines appointment scheduling at this time and requests that Dr. Maryanne call in something for his blood pressure  Patient ended call before care advice could be given  1. BLOOD PRESSURE: What is your blood pressure? Did you take at least two measurements 5 minutes apart?     160 systolic and 158 systolic a while ago Normally run 130's and 140's systolic 2. ONSET: When did you take your blood pressure?     Has been running high for a pretty good while 3. HOW: How did you take your blood pressure? (e.g., automatic home BP monitor, visiting nurse)     Automatic cuff 4. HISTORY: Do you have a history of high blood pressure?  History of HTN a long time back 5. MEDICINES: Are you taking any medicines for blood pressure? Have you missed any doses recently?     Not at this time 6. OTHER SYMPTOMS: Do you have any symptoms? (e.g., blurred vision, chest pain, difficulty breathing, headache, weakness)     Denies all symptoms, feels pretty  normal  Protocols used: Blood Pressure - High-A-AH

## 2024-01-08 NOTE — Telephone Encounter (Signed)
 Dr. Maryanne, are you alright sending in a BP med? Pt had CPE with you in Dec 2024. He next appt for CPE in 12/19. You have advised him to follow up in one year. Looks like his BP was up at the 02/2023 appt too.

## 2024-01-08 NOTE — Telephone Encounter (Signed)
 Left message on voice mail to schedule a office visit.LS

## 2024-01-09 NOTE — Telephone Encounter (Signed)
 Left message making pt aware. Appt scheduled for 10/31 at 12:05. Instructed pt to call back if needs to be r/s.

## 2024-01-09 NOTE — Telephone Encounter (Signed)
 I would need to see him to assess and make any new changes or new medications.  Have him bring his blood pressure numbers with him to a visit.

## 2024-01-11 ENCOUNTER — Ambulatory Visit

## 2024-02-29 ENCOUNTER — Ambulatory Visit: Payer: Medicare Other | Admitting: Family Medicine

## 2024-02-29 ENCOUNTER — Encounter: Payer: Self-pay | Admitting: Family Medicine

## 2024-02-29 VITALS — BP 154/70 | HR 65 | Ht 65.0 in | Wt 139.0 lb

## 2024-02-29 DIAGNOSIS — G4700 Insomnia, unspecified: Secondary | ICD-10-CM

## 2024-02-29 DIAGNOSIS — Z0001 Encounter for general adult medical examination with abnormal findings: Secondary | ICD-10-CM

## 2024-02-29 DIAGNOSIS — M19011 Primary osteoarthritis, right shoulder: Secondary | ICD-10-CM

## 2024-02-29 DIAGNOSIS — M19012 Primary osteoarthritis, left shoulder: Secondary | ICD-10-CM | POA: Diagnosis not present

## 2024-02-29 DIAGNOSIS — E785 Hyperlipidemia, unspecified: Secondary | ICD-10-CM

## 2024-02-29 DIAGNOSIS — Z0000 Encounter for general adult medical examination without abnormal findings: Secondary | ICD-10-CM

## 2024-02-29 DIAGNOSIS — F411 Generalized anxiety disorder: Secondary | ICD-10-CM

## 2024-02-29 DIAGNOSIS — Z125 Encounter for screening for malignant neoplasm of prostate: Secondary | ICD-10-CM

## 2024-02-29 LAB — LIPID PANEL

## 2024-02-29 MED ORDER — TRAZODONE HCL 50 MG PO TABS: 25.0000 mg | ORAL_TABLET | Freq: Every evening | ORAL | 3 refills | Status: AC | PRN

## 2024-02-29 NOTE — Progress Notes (Signed)
 "  BP (!) 154/70   Pulse 65   Ht 5' 5 (1.651 m)   Wt 139 lb (63 kg)   SpO2 97%   BMI 23.13 kg/m    Subjective:   Patient ID: Craig Baird, male    DOB: 1933/07/04, 89 y.o.   MRN: [id number]  HPI: Craig Baird is a 89 y.o. male presenting on 02/29/2024 for Medical Management of Chronic Issues, Hyperlipidemia, Hypertension, and Insomnia   Discussed the use of AI scribe software for clinical note transcription with the patient, who gave verbal consent to proceed.  History of Present Illness   Craig Baird is an 89 year old male who presents for a recheck of his blood pressure  And physical exam  Blood pressure variability - Blood pressure fluctuates throughout the day - Morning blood pressure is 125 mmHg after waking - Physical activity lowers blood pressure - Sedentary activity increases blood pressure - Monitors blood pressure at home  Shoulder pain and soreness - Fall earlier this year in a store due to a scatter rug - Sustained a bruised shoulder, no fractures - Evaluated at the hospital after the fall - No current pain in the shoulder - Soreness in the shoulder with movement - Suspects underlying arthritis in the arm  Sleep disturbance - Chronic poor sleep, never been a 'big sleeper' - Feels rested despite limited sleep - Previously discussed with a physician who indicated some individuals require less sleep          Relevant past medical, surgical, family and social history reviewed and updated as indicated. Interim medical history since our last visit reviewed. Allergies and medications reviewed and updated.  Review of Systems  Constitutional:  Negative for chills and fever.  HENT:  Negative for ear pain and tinnitus.   Eyes:  Negative for pain and visual disturbance.  Respiratory:  Negative for cough, shortness of breath and wheezing.   Cardiovascular:  Negative for chest pain, palpitations and leg swelling.  Gastrointestinal:  Negative for  abdominal pain, blood in stool, constipation and diarrhea.  Genitourinary:  Negative for dysuria and hematuria.  Musculoskeletal:  Positive for arthralgias. Negative for back pain, gait problem and myalgias.  Skin:  Negative for rash.  Neurological:  Negative for dizziness, weakness, light-headedness and headaches.  Psychiatric/Behavioral:  Positive for sleep disturbance. Negative for suicidal ideas.   All other systems reviewed and are negative.   Per HPI unless specifically indicated above   Allergies as of 02/29/2024       Reactions   Sulfa Antibiotics Rash        Medication List        Accurate as of February 29, 2024  8:18 AM. If you have any questions, ask your nurse or doctor.          diclofenac  Sodium 1 % Gel Commonly known as: Voltaren  Apply 2 g topically 4 (four) times daily.   fluticasone  50 MCG/ACT nasal spray Commonly known as: FLONASE  Place 1 spray into both nostrils 2 (two) times daily as needed for allergies or rhinitis.   traZODone  50 MG tablet Commonly known as: DESYREL  Take 0.5-1 tablets (25-50 mg total) by mouth at bedtime as needed for sleep. Started by: Fonda Levins, MD         Objective:   BP (!) 154/70   Pulse 65   Ht 5' 5 (1.651 m)   Wt 139 lb (63 kg)   SpO2 97%   BMI 23.13 kg/m  Wt Readings from Last 3 Encounters:  02/29/24 139 lb (63 kg)  12/28/23 147 lb (66.7 kg)  05/10/23 144 lb 6.4 oz (65.5 kg)    Physical Exam Vitals reviewed.  Constitutional:      General: He is not in acute distress.    Appearance: He is well-developed. He is not diaphoretic.  HENT:     Right Ear: External ear normal.     Left Ear: External ear normal.     Nose: Nose normal.     Mouth/Throat:     Pharynx: No oropharyngeal exudate.  Eyes:     General: No scleral icterus.       Right eye: No discharge.     Conjunctiva/sclera: Conjunctivae normal.     Pupils: Pupils are equal, round, and reactive to light.  Neck:     Thyroid : No  thyromegaly.  Cardiovascular:     Rate and Rhythm: Normal rate and regular rhythm.     Heart sounds: Normal heart sounds. No murmur heard. Pulmonary:     Effort: Pulmonary effort is normal. No respiratory distress.     Breath sounds: Normal breath sounds. No wheezing.  Abdominal:     General: Bowel sounds are normal. There is no distension.     Palpations: Abdomen is soft.     Tenderness: There is no abdominal tenderness. There is no guarding or rebound.  Musculoskeletal:        General: Normal range of motion.     Right shoulder: Crepitus present. No tenderness. Normal range of motion.     Left shoulder: Crepitus present. No tenderness. Normal range of motion.     Cervical back: Neck supple.  Lymphadenopathy:     Cervical: No cervical adenopathy.  Skin:    General: Skin is warm and dry.     Findings: No rash.  Neurological:     Mental Status: He is alert and oriented to person, place, and time.     Coordination: Coordination normal.  Psychiatric:        Behavior: Behavior normal.    Physical Exam   HEENT: Throat normal. Ears clear. NECK: Thyroid  normal, no nodules. CHEST: Lungs clear to auscultation bilaterally. CARDIOVASCULAR: Heart regular rate and rhythm, no murmurs. EXTREMITIES: No swelling in extremities. NEUROLOGICAL: Reflexes normal bilaterally.         Assessment & Plan:   Problem List Items Addressed This Visit       Other   GAD (generalized anxiety disorder)   Relevant Medications   traZODone  (DESYREL ) 50 MG tablet   Other Relevant Orders   CBC with Differential/Platelet   Hyperlipidemia   Relevant Orders   CMP14+EGFR   Lipid panel   Insomnia   Relevant Orders   CBC with Differential/Platelet   Other Visit Diagnoses       Physical exam    -  Primary   Relevant Orders   CBC with Differential/Platelet   CMP14+EGFR   Lipid panel   PSA, total and free     Prostate cancer screening       Relevant Orders   PSA, total and free            Insomnia Chronic insomnia with difficulty sleeping. Open to medication. - Prescribed trazodone  for sleep as needed.  Osteoarthritis of shoulder Osteoarthritis with soreness. No prior steroid injections. - Administered Depo-Medrol  80 intramuscular injection for arthritis.  Hypertension Blood pressure readings fluctuate, generally in the 120s. No immediate concerns. - Continue monitoring blood pressure at home.  General Health Maintenance Discussed flu season and preventive measures. - Advised hand hygiene, especially during holiday gatherings.          Follow up plan: Return in about 1 year (around 02/28/2025), or if symptoms worsen or fail to improve, for Physical exam.  Counseling provided for all of the vaccine components Orders Placed This Encounter  Procedures   CBC with Differential/Platelet   CMP14+EGFR   Lipid panel   PSA, total and free    Fonda Levins, MD [facility] 02/29/2024, 8:18 AM     "

## 2024-03-03 LAB — CMP14+EGFR
ALT: 15 IU/L (ref 0–44)
AST: 19 IU/L (ref 0–40)
Albumin: 4.2 g/dL (ref 3.7–4.7)
Alkaline Phosphatase: 105 IU/L (ref 48–129)
BUN/Creatinine Ratio: 11 (ref 10–24)
BUN: 11 mg/dL (ref 8–27)
Bilirubin Total: 0.4 mg/dL (ref 0.0–1.2)
CO2: 25 mmol/L (ref 20–29)
Calcium: 9 mg/dL (ref 8.6–10.2)
Chloride: 103 mmol/L (ref 96–106)
Creatinine, Ser: 0.97 mg/dL (ref 0.76–1.27)
Globulin, Total: 2.1 g/dL (ref 1.5–4.5)
Glucose: 87 mg/dL (ref 70–99)
Potassium: 4.9 mmol/L (ref 3.5–5.2)
Sodium: 141 mmol/L (ref 134–144)
Total Protein: 6.3 g/dL (ref 6.0–8.5)
eGFR: 75 mL/min/1.73

## 2024-03-03 LAB — LIPID PANEL
Cholesterol, Total: 208 mg/dL — AB (ref 100–199)
HDL: 50 mg/dL
LDL CALC COMMENT:: 4.2 ratio (ref 0.0–5.0)
LDL Chol Calc (NIH): 141 mg/dL — AB (ref 0–99)
Triglycerides: 95 mg/dL (ref 0–149)
VLDL Cholesterol Cal: 17 mg/dL (ref 5–40)

## 2024-03-03 LAB — CBC WITH DIFFERENTIAL/PLATELET
Basophils Absolute: 0.1 x10E3/uL (ref 0.0–0.2)
Basos: 2 %
EOS (ABSOLUTE): 0.4 x10E3/uL (ref 0.0–0.4)
Eos: 8 %
Hematocrit: 44.7 % (ref 37.5–51.0)
Hemoglobin: 14.8 g/dL (ref 13.0–17.7)
Immature Grans (Abs): 0 x10E3/uL (ref 0.0–0.1)
Immature Granulocytes: 0 %
Lymphocytes Absolute: 1.5 x10E3/uL (ref 0.7–3.1)
Lymphs: 28 %
MCH: 33 pg (ref 26.6–33.0)
MCHC: 33.1 g/dL (ref 31.5–35.7)
MCV: 100 fL — ABNORMAL HIGH (ref 79–97)
Monocytes Absolute: 0.8 x10E3/uL (ref 0.1–0.9)
Monocytes: 14 %
Neutrophils Absolute: 2.6 x10E3/uL (ref 1.4–7.0)
Neutrophils: 47 %
Platelets: 302 x10E3/uL (ref 150–450)
RBC: 4.49 x10E6/uL (ref 4.14–5.80)
RDW: 13 % (ref 11.6–15.4)
WBC: 5.3 x10E3/uL (ref 3.4–10.8)

## 2024-03-03 LAB — PSA, TOTAL AND FREE
PSA, Free Pct: 22.1 %
PSA, Free: 0.42 ng/mL
Prostate Specific Ag, Serum: 1.9 ng/mL (ref 0.0–4.0)

## 2024-03-07 ENCOUNTER — Telehealth: Payer: Self-pay

## 2024-03-07 NOTE — Telephone Encounter (Signed)
 Copied from CRM #[id number]. Topic: Clinical - Lab/Test Results >> Mar 07, 2024 10:54 AM Nathanel BROCKS wrote: Reason for CRM: pt called about lab results, please call when read.

## 2024-03-10 ENCOUNTER — Ambulatory Visit: Payer: Self-pay | Admitting: Family Medicine

## 2024-03-10 ENCOUNTER — Telehealth: Payer: Self-pay | Admitting: Family Medicine

## 2024-03-10 ENCOUNTER — Telehealth: Payer: Self-pay | Admitting: Orthopedic Surgery

## 2024-03-10 NOTE — Telephone Encounter (Signed)
 Copied from CRM [id number]. Topic: Clinical - Lab/Test Results >> Mar 10, 2024  9:45 AM Diannia H wrote: Reason for CRM: pt called about lab results could you assist? Nothing is there to for us  to go over. Patients callback number is [phone].

## 2024-03-10 NOTE — Telephone Encounter (Signed)
 Please review and we will make pt aware.

## 2024-03-10 NOTE — Telephone Encounter (Signed)
 Pt would like to have bil knee gel injection and request we get approval from his insurance

## 2024-03-10 NOTE — Telephone Encounter (Signed)
 See result note.

## 2024-03-12 MED ORDER — ROSUVASTATIN CALCIUM 5 MG PO TABS: 5.0000 mg | ORAL_TABLET | Freq: Every evening | ORAL | 3 refills | Status: AC

## 2024-03-17 DIAGNOSIS — M170 Bilateral primary osteoarthritis of knee: Secondary | ICD-10-CM

## 2024-03-17 NOTE — Telephone Encounter (Signed)
 VOB submitted for durolane

## 2024-03-31 ENCOUNTER — Telehealth: Payer: Self-pay

## 2024-03-31 ENCOUNTER — Telehealth: Payer: Self-pay | Admitting: Family Medicine

## 2024-03-31 ENCOUNTER — Ambulatory Visit: Payer: Medicare Other

## 2024-03-31 VITALS — BP 154/70 | HR 65 | Ht 65.0 in | Wt 139.0 lb

## 2024-03-31 DIAGNOSIS — Z0000 Encounter for general adult medical examination without abnormal findings: Secondary | ICD-10-CM

## 2024-03-31 NOTE — Telephone Encounter (Unsigned)
 Copied from CRM [id number]. Topic: Clinical - Medication Question >> Mar 31, 2024 12:27 PM Willma R wrote: Reason for CRM: Patient returning call from office. Relayed message in the Telephone encounter from today. Patient has questions in regards to if Dr Dettinger is going to call in the medications or if he wants to wait until he collects his BP numbers for 2 weeks.  Patient can be reached at [phone], ok to leave VM

## 2024-03-31 NOTE — Telephone Encounter (Signed)
 Tried calling pt, NA/LVM for pt.   If pt call please relay pcp's msg to him. Thanks.   Alia t/cma

## 2024-03-31 NOTE — Patient Instructions (Signed)
 Mr. Edelen,  Thank you for taking the time for your Medicare Wellness Visit. I appreciate your continued commitment to your health goals. Please review the care plan we discussed, and feel free to reach out if I can assist you further.  Please note that Annual Wellness Visits do not include a physical exam. Some assessments may be limited, especially if the visit was conducted virtually. If needed, we may recommend an in-person follow-up with your provider.  Ongoing Care Seeing your primary care provider every 3 to 6 months helps us  monitor your health and provide consistent, personalized care.   Referrals If a referral was made during today's visit and you haven't received any updates within two weeks, please contact the referred provider directly to check on the status.  Recommended Screenings:  Health Maintenance  Topic Date Due   COVID-19 Vaccine (4 - 2025-26 season) 11/12/2023   Medicare Annual Wellness Visit  03/28/2024   Flu Shot  06/10/2024*   DTaP/Tdap/Td vaccine (3 - Td or Tdap) 05/17/2031   Pneumococcal Vaccine for age over 34  Completed   Zoster (Shingles) Vaccine  Completed   Meningitis B Vaccine  Aged Out  *Topic was postponed. The date shown is not the original due date.       03/31/2024   10:42 AM  Advanced Directives  Does Patient Have a Medical Advance Directive? No  Would patient like information on creating a medical advance directive? No - Patient declined    Vision: Annual vision screenings are recommended for early detection of glaucoma, cataracts, and diabetic retinopathy. These exams can also reveal signs of chronic conditions such as diabetes and high blood pressure.  Dental: Annual dental screenings help detect early signs of oral cancer, gum disease, and other conditions linked to overall health, including heart disease and diabetes.  Please see the attached documents for additional preventive care recommendations.

## 2024-03-31 NOTE — Telephone Encounter (Signed)
Noted  -LS

## 2024-03-31 NOTE — Progress Notes (Signed)
 "  Chief Complaint  Patient presents with   Medicare Wellness     Subjective:   Craig Baird is a 89 y.o. male who presents for a [employer] Visit.  Visit info / Clinical Intake: Medicare Wellness Visit Type:: Subsequent Annual Wellness Visit Persons participating in visit and providing information:: patient Medicare Wellness Visit Mode:: Telephone If telephone:: video declined Since this visit was completed virtually, some vitals may be partially provided or unavailable. Missing vitals are due to the limitations of the virtual format.: Unable to obtain vitals - no equipment If Telephone or Video please confirm:: I connected with patient using audio/video enable telemedicine. I verified patient identity with two identifiers, discussed telehealth limitations, and patient agreed to proceed. Patient Location:: home Provider Location:: office Interpreter Needed?: No Pre-visit prep was completed: yes AWV questionnaire completed by patient prior to visit?: no Living arrangements:: lives with spouse/significant other Patient's Overall Health Status Rating: very good Typical amount of pain: none Does pain affect daily life?: no Are you currently prescribed opioids?: no  Dietary Habits and Nutritional Risks How many meals a day?: 3 Eats fruit and vegetables daily?: yes Most meals are obtained by: preparing own meals In the last 2 weeks, have you had any of the following?: none Diabetic:: no  Functional Status Activities of Daily Living (to include ambulation/medication): Independent Ambulation: Independent Medication Administration: Independent Home Management (perform basic housework or laundry): Independent Manage your own finances?: yes Primary transportation is: driving Concerns about vision?: no *vision screening is required for WT[facility]* (last ov w/2025)  Fall Screening Falls in the past year?: 1 Number of falls in past year: 1 Was there an injury with Fall?:  1 Fall Risk Category Calculator: 3 Patient Fall Risk Level: High Fall Risk  Fall Risk Patient at Risk for Falls Due to: Impaired balance/gait; Impaired mobility; Impaired vision Fall risk Follow up: Falls evaluation completed; Education provided  Home and Transportation Safety: All rugs have non-skid backing?: yes All stairs or steps have railings?: N/A, no stairs Grab bars in the bathtub or shower?: yes Have non-skid surface in bathtub or shower?: yes Good home lighting?: yes Regular seat belt use?: yes Hospital stays in the last year:: no  Cognitive Assessment Difficulty concentrating, remembering, or making decisions? : no Will 6CIT or Mini Cog be Completed: yes What year is it?: 0 points What month is it?: 0 points Give patient an address phrase to remember (5 components): [address] Craig Baird About what time is it?: 0 points Count backwards from 20 to 1: 0 points Say the months of the year in reverse: 0 points Repeat the address phrase from earlier: 0 points 6 CIT Score: 0 points  Advance Directives (For Healthcare) Does Patient Have a Medical Advance Directive?: No Would patient like information on creating a medical advance directive?: No - Patient declined  Reviewed/Updated  Reviewed/Updated: Reviewed All (Medical, Surgical, Family, Medications, Allergies, Care Teams, Patient Goals); Medical History; Surgical History; Family History; Medications; Allergies; Care Teams; Patient Goals    Allergies (verified) Sulfa antibiotics   Current Medications (verified) Outpatient Encounter Medications as of 03/31/2024  Medication Sig   diclofenac  Sodium (VOLTAREN ) 1 % GEL Apply 2 g topically 4 (four) times daily.   escitalopram  (LEXAPRO ) 20 MG tablet Take 20 mg by mouth daily.   fluticasone  (FLONASE ) 50 MCG/ACT nasal spray Place 1 spray into both nostrils 2 (two) times daily as needed for allergies or rhinitis.   rosuvastatin  (CRESTOR ) 5 MG tablet Take 1  tablet (5  mg total) by mouth at bedtime.   traZODone  (DESYREL ) 50 MG tablet Take 0.5-1 tablets (25-50 mg total) by mouth at bedtime as needed for sleep.   No facility-administered encounter medications on file as of 03/31/2024.    History: Past Medical History:  Diagnosis Date   Allergy    Anxiety    Cataract    Diverticulosis    Hernia    L inguinal hernia   Hiatal hernia    Hyperlipidemia    SCCA (squamous cell carcinoma) of skin 09/22/2019   Right Forearem-posterior (well diff) (treatment after biopsy)   Sinusitis    Past Surgical History:  Procedure Laterality Date   COLONOSCOPY N/A 09/15/2016   Dr. Shaaron: Diverticulosis, several polyps removed, couple of tubular adenomas.  No future surveillance colonoscopies recommended given age.   ESOPHAGOGASTRODUODENOSCOPY N/A 09/15/2016   Dr. Shaaron: small hiatal hernia.   THYROID  CYST EXCISION  1973   TONSILLECTOMY     Family History  Problem Relation Age of Onset   Hypertension Mother    Stroke Mother    Parkinson's disease Mother    Hip fracture Mother    Hypertension Brother 28   Heart disease Brother    Birth defects Father        spine   Early death Sister    Early death Sister    Colon cancer Neg Hx    Gastric cancer Neg Hx    Esophageal cancer Neg Hx    Social History   Occupational History   Not on file  Tobacco Use   Smoking status: Never   Smokeless tobacco: Never  Vaping Use   Vaping status: Never Used  Substance and Sexual Activity   Alcohol use: No   Drug use: No   Sexual activity: Yes   Tobacco Counseling Counseling given: Yes  SDOH Screenings   Food Insecurity: No Food Insecurity (03/31/2024)  Housing: Unknown (03/31/2024)  Transportation Needs: No Transportation Needs (03/31/2024)  Utilities: Not At Risk (03/31/2024)  Alcohol Screen: Low Risk (03/29/2023)  Depression (PHQ2-9): Low Risk (03/31/2024)  Financial Resource Strain: Low Risk (03/29/2023)  Physical Activity: Insufficiently Active (03/31/2024)   Social Connections: Moderately Integrated (03/31/2024)  Stress: No Stress Concern Present (03/31/2024)  Tobacco Use: Low Risk (03/31/2024)  Health Literacy: Adequate Health Literacy (03/31/2024)   See flowsheets for full screening details  Depression Screen PHQ 2 & 9 Depression Scale- Over the past 2 weeks, how often have you been bothered by any of the following problems? Little interest or pleasure in doing things: 0 Feeling down, depressed, or hopeless (PHQ Adolescent also includes...irritable): 1 PHQ-2 Total Score: 1 Trouble falling or staying asleep, or sleeping too much: 3 Feeling tired or having little energy: 1 Poor appetite or overeating (PHQ Adolescent also includes...weight loss): 0 Feeling bad about yourself - or that you are a failure or have let yourself or your family down: 0 Trouble concentrating on things, such as reading the newspaper or watching television (PHQ Adolescent also includes...like school work): 0 Moving or speaking so slowly that other people could have noticed. Or the opposite - being so fidgety or restless that you have been moving around a lot more than usual: 0 Thoughts that you would be better off dead, or of hurting yourself in some way: 0 PHQ-9 Total Score: 4 If you checked off any problems, how difficult have these problems made it for you to do your work, take care of things at home, or get along with other  people?: Not difficult at all  Depression Treatment Depression Interventions/Treatment : Patient refuses Treatment     Goals Addressed             This Visit's Progress    Exercise 150 minutes per week (moderate activity)   On track            Objective:    Today's Vitals   03/31/24 1103  BP: (!) 154/70  Pulse: 65  Weight: 139 lb (63 kg)  Height: 5' 5 (1.651 m)   Body mass index is 23.13 kg/m.  Hearing/Vision screen No results found. Immunizations and Health Maintenance Health Maintenance  Topic Date Due   COVID-19  Vaccine (4 - 2025-26 season) 11/12/2023   Medicare Annual Wellness (AWV)  03/31/2025   DTaP/Tdap/Td (3 - Td or Tdap) 05/17/2031   Pneumococcal Vaccine: 50+ Years  Completed   Influenza Vaccine  Completed   Zoster Vaccines- Shingrix  Completed   Meningococcal B Vaccine  Aged Out        Assessment/Plan:  This is a routine wellness examination for Craig Baird.  Patient Care Team: Dettinger, Fonda LABOR, MD as PCP - General (Family Medicine) Livingston Rigg, MD as Consulting Physician (Dermatology) Shaaron Lamar HERO, MD as Consulting Physician (Gastroenterology) [facility], [facility] Shona Rush, MD (Dermatology)  I have personally reviewed and noted the following in the patients chart:   Medical and social history Use of alcohol, tobacco or illicit drugs  Current medications and supplements including opioid prescriptions. Functional ability and status Nutritional status Physical activity Advanced directives List of other physicians Hospitalizations, surgeries, and ER visits in previous 12 months Vitals Screenings to include cognitive, depression, and falls Referrals and appointments  No orders of the defined types were placed in this encounter.  In addition, I have reviewed and discussed with patient certain preventive protocols, quality metrics, and best practice recommendations. A written personalized care plan for preventive services as well as general preventive health recommendations were provided to patient.   Craig Baird, CMA   03/31/2024   Return in 1 year (on 03/31/2025).  After Visit Summary: (MyChart) Due to this being a telephonic visit, the after visit summary with patients personalized plan was offered to patient via MyChart   Nurse Notes: Pt is aware and due the covid vaccine, pt declined.  "

## 2024-03-31 NOTE — Telephone Encounter (Signed)
-----   Message from Fonda Levins, MD sent at 03/31/2024 11:15 AM EST ----- Yes it is okay for him to take the escitalopram  and the trazodone .  I have no concern about taking those 2 medications.  He can take the escitalopram  in the morning and the trazodone  in the evening.  As far as his blood pressures I want him to check his blood pressure at home every day for the next 2 weeks and then send me a MyChart message or call me in the numbers. ----- Message ----- From: Debby Simper, CMA Sent: 03/31/2024  11:09 AM EST To: Fonda LABOR Dettinger, MD  During our awv pt stated that he is taking the Escitalopram  20mg . It was not on his med list, I had to 'add it' to list. Is he supposed to take both the Escitalopram  and the trazodone ? Pls confirm.  Pt also stated that he noticed that his bp fluctuate and sometimes it gets to be high which worries him. Pt wants to know if he should be on bp meds? Pt has an apt on 04/25/24 to discuss bp and told to call pcp's office to report high bp's and to seek med attention should he needs to.   Advise if any. Alia t/cma

## 2024-03-31 NOTE — Telephone Encounter (Signed)
 Patient called back, advised of the message from Dr Maryanne. Patient understood and wil call back with blood pressure readings in 2 weeks

## 2024-04-16 ENCOUNTER — Encounter: Payer: Self-pay | Admitting: Orthopedic Surgery

## 2024-04-16 ENCOUNTER — Ambulatory Visit: Admitting: Orthopedic Surgery

## 2024-04-16 DIAGNOSIS — M170 Bilateral primary osteoarthritis of knee: Secondary | ICD-10-CM

## 2024-04-16 MED ORDER — SODIUM HYALURONATE 60 MG/3ML IX PRSY
60.0000 mg | PREFILLED_SYRINGE | INTRA_ARTICULAR | Status: AC | PRN
  Administered 2024-04-16: 60 mg via INTRA_ARTICULAR

## 2024-04-16 NOTE — Progress Notes (Addendum)
" ° °  Procedure Note  Patient: Craig Baird             Date of Birth: 1933-06-13           MRN: [id number]             Visit Date: 04/16/2024  Procedures: Visit Diagnoses:  1. Primary osteoarthritis of both knees     Large Joint Inj: bilateral knee on 04/16/2024 6:22 PM Indications: diagnostic evaluation, joint swelling and pain Details: 18 G 1.5 in needle, superolateral approach  Arthrogram: No  Medications (Right): 60 mg Sodium Hyaluronate 60 MG/3ML Medications (Left): 60 mg Sodium Hyaluronate 60 MG/3ML Outcome: tolerated well, no immediate complications Procedure, treatment alternatives, risks and benefits explained, specific risks discussed. Consent was given by the patient. Immediately prior to procedure a time out was called to verify the correct patient, procedure, equipment, support staff and site/side marked as required. Patient was prepped and draped in the usual sterile fashion.    Lot #[id number] This patient is diagnosed with osteoarthritis of the knee(s).    Radiographs show evidence of joint space narrowing, osteophytes, subchondral sclerosis and/or subchondral cysts.  This patient has knee pain which interferes with functional and activities of daily living.    This patient has experienced inadequate response, adverse effects and/or intolerance with conservative treatments such as acetaminophen, NSAIDS, topical creams, physical therapy or regular exercise, knee bracing and/or weight loss.   This patient has experienced inadequate response or has a contraindication to intra articular steroid injections for at least 3 months.   This patient is not scheduled to have a total knee replacement within 6 months of starting treatment with viscosupplementation.    "

## 2024-04-25 ENCOUNTER — Ambulatory Visit: Admitting: Family Medicine

## 2024-10-16 ENCOUNTER — Ambulatory Visit: Admitting: Surgical

## 2025-03-02 ENCOUNTER — Encounter: Admitting: Family Medicine

## 2025-04-01 ENCOUNTER — Ambulatory Visit
